# Patient Record
Sex: Male | Born: 1955 | Race: White | Hispanic: No | Marital: Single | Smoking: Never smoker
Health system: Southern US, Community
[De-identification: ages and names within clinical notes are randomized; demographics above are authoritative.]

## PROBLEM LIST (undated history)

## (undated) DIAGNOSIS — I351 Nonrheumatic aortic (valve) insufficiency: Secondary | ICD-10-CM

## (undated) DIAGNOSIS — T8859XA Other complications of anesthesia, initial encounter: Secondary | ICD-10-CM

## (undated) DIAGNOSIS — R0602 Shortness of breath: Secondary | ICD-10-CM

## (undated) DIAGNOSIS — Z954 Presence of other heart-valve replacement: Secondary | ICD-10-CM

## (undated) DIAGNOSIS — Z951 Presence of aortocoronary bypass graft: Secondary | ICD-10-CM

## (undated) DIAGNOSIS — Z953 Presence of xenogenic heart valve: Secondary | ICD-10-CM

## (undated) DIAGNOSIS — T8203XA Leakage of heart valve prosthesis, initial encounter: Secondary | ICD-10-CM

## (undated) DIAGNOSIS — T4145XA Adverse effect of unspecified anesthetic, initial encounter: Secondary | ICD-10-CM

## (undated) DIAGNOSIS — I359 Nonrheumatic aortic valve disorder, unspecified: Secondary | ICD-10-CM

## (undated) DIAGNOSIS — T8209XA Other mechanical complication of heart valve prosthesis, initial encounter: Secondary | ICD-10-CM

## (undated) DIAGNOSIS — R350 Frequency of micturition: Secondary | ICD-10-CM

## (undated) DIAGNOSIS — IMO0001 Reserved for inherently not codable concepts without codable children: Secondary | ICD-10-CM

## (undated) DIAGNOSIS — D696 Thrombocytopenia, unspecified: Secondary | ICD-10-CM

## (undated) DIAGNOSIS — K219 Gastro-esophageal reflux disease without esophagitis: Secondary | ICD-10-CM

## (undated) DIAGNOSIS — L719 Rosacea, unspecified: Secondary | ICD-10-CM

## (undated) DIAGNOSIS — I428 Other cardiomyopathies: Secondary | ICD-10-CM

## (undated) DIAGNOSIS — F329 Major depressive disorder, single episode, unspecified: Secondary | ICD-10-CM

## (undated) DIAGNOSIS — F32A Depression, unspecified: Secondary | ICD-10-CM

## (undated) HISTORY — DX: Reserved for inherently not codable concepts without codable children: IMO0001

## (undated) HISTORY — PX: INGUINAL HERNIA REPAIR: SUR1180

## (undated) HISTORY — DX: Other mechanical complication of heart valve prosthesis, initial encounter: T82.09XA

## (undated) HISTORY — DX: Nonrheumatic aortic (valve) insufficiency: I35.1

## (undated) HISTORY — DX: Leakage of heart valve prosthesis, initial encounter: T82.03XA

## (undated) HISTORY — DX: Gastro-esophageal reflux disease without esophagitis: K21.9

## (undated) HISTORY — DX: Presence of xenogenic heart valve: Z95.3

## (undated) HISTORY — DX: Shortness of breath: R06.02

## (undated) HISTORY — DX: Rosacea, unspecified: L71.9

---
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
## 2003-03-14 ENCOUNTER — Encounter: Payer: Self-pay | Admitting: Cardiovascular Disease

## 2006-05-10 ENCOUNTER — Encounter: Payer: Self-pay | Admitting: Cardiovascular Disease

## 2007-12-13 ENCOUNTER — Encounter: Payer: Self-pay | Admitting: Cardiovascular Disease

## 2008-01-01 ENCOUNTER — Encounter: Payer: Self-pay | Admitting: Cardiovascular Disease

## 2009-07-17 ENCOUNTER — Encounter: Payer: Self-pay | Admitting: Cardiovascular Disease

## 2009-07-24 ENCOUNTER — Telehealth: Payer: Self-pay | Admitting: Cardiovascular Disease

## 2009-08-26 ENCOUNTER — Ambulatory Visit: Payer: Self-pay | Admitting: Cardiovascular Disease

## 2009-08-26 ENCOUNTER — Encounter: Payer: Self-pay | Admitting: Cardiovascular Disease

## 2009-08-26 ENCOUNTER — Ambulatory Visit: Payer: Self-pay

## 2009-08-26 DIAGNOSIS — L719 Rosacea, unspecified: Secondary | ICD-10-CM | POA: Insufficient documentation

## 2009-08-26 DIAGNOSIS — Q231 Congenital insufficiency of aortic valve: Secondary | ICD-10-CM | POA: Insufficient documentation

## 2009-10-21 ENCOUNTER — Ambulatory Visit: Payer: Self-pay | Admitting: Cardiovascular Disease

## 2009-10-30 ENCOUNTER — Encounter: Payer: Self-pay | Admitting: Cardiovascular Disease

## 2009-10-30 ENCOUNTER — Encounter (INDEPENDENT_AMBULATORY_CARE_PROVIDER_SITE_OTHER): Payer: Self-pay | Admitting: *Deleted

## 2009-11-13 ENCOUNTER — Ambulatory Visit: Payer: Self-pay | Admitting: Cardiovascular Disease

## 2009-11-13 DIAGNOSIS — I359 Nonrheumatic aortic valve disorder, unspecified: Secondary | ICD-10-CM | POA: Insufficient documentation

## 2009-11-13 LAB — CONVERTED CEMR LAB
Basophils Absolute: 0.1 10*3/uL (ref 0.0–0.1)
Chloride: 105 meq/L (ref 96–112)
Creatinine, Ser: 0.96 mg/dL (ref 0.40–1.50)
Hemoglobin: 16 g/dL (ref 13.0–17.0)
INR: 1 (ref <1.50)
Lymphocytes Relative: 43 % (ref 12–46)
Lymphs Abs: 3.3 10*3/uL (ref 0.7–4.0)
Monocytes Absolute: 0.4 10*3/uL (ref 0.1–1.0)
Monocytes Relative: 6 % (ref 3–12)
Neutro Abs: 3.4 10*3/uL (ref 1.7–7.7)
Prothrombin Time: 13.1 s (ref 11.6–15.2)
RBC: 5.07 M/uL (ref 4.22–5.81)
RDW: 12.9 % (ref 11.5–15.5)
Sodium: 140 meq/L (ref 135–145)
WBC: 7.6 10*3/uL (ref 4.0–10.5)

## 2009-11-16 ENCOUNTER — Inpatient Hospital Stay (HOSPITAL_BASED_OUTPATIENT_CLINIC_OR_DEPARTMENT_OTHER): Admission: RE | Admit: 2009-11-16 | Discharge: 2009-11-16 | Payer: Self-pay | Admitting: Cardiovascular Disease

## 2009-11-16 ENCOUNTER — Ambulatory Visit: Payer: Self-pay | Admitting: Cardiovascular Disease

## 2009-11-23 ENCOUNTER — Ambulatory Visit: Payer: Self-pay | Admitting: Thoracic Surgery (Cardiothoracic Vascular Surgery)

## 2009-11-23 ENCOUNTER — Encounter: Payer: Self-pay | Admitting: Cardiovascular Disease

## 2009-11-24 ENCOUNTER — Encounter
Admission: RE | Admit: 2009-11-24 | Discharge: 2009-11-24 | Payer: Self-pay | Admitting: Thoracic Surgery (Cardiothoracic Vascular Surgery)

## 2009-12-04 ENCOUNTER — Encounter: Payer: Self-pay | Admitting: Thoracic Surgery (Cardiothoracic Vascular Surgery)

## 2009-12-07 ENCOUNTER — Ambulatory Visit: Payer: Self-pay | Admitting: Thoracic Surgery (Cardiothoracic Vascular Surgery)

## 2009-12-07 ENCOUNTER — Encounter: Payer: Self-pay | Admitting: Cardiovascular Disease

## 2009-12-08 ENCOUNTER — Ambulatory Visit: Payer: Self-pay | Admitting: Thoracic Surgery (Cardiothoracic Vascular Surgery)

## 2009-12-08 ENCOUNTER — Inpatient Hospital Stay (HOSPITAL_COMMUNITY)
Admission: RE | Admit: 2009-12-08 | Discharge: 2009-12-14 | Payer: Self-pay | Admitting: Thoracic Surgery (Cardiothoracic Vascular Surgery)

## 2009-12-08 ENCOUNTER — Encounter: Payer: Self-pay | Admitting: Thoracic Surgery (Cardiothoracic Vascular Surgery)

## 2009-12-08 DIAGNOSIS — Z953 Presence of xenogenic heart valve: Secondary | ICD-10-CM

## 2009-12-08 HISTORY — PX: AORTIC VALVE REPLACEMENT: SHX41

## 2009-12-08 HISTORY — DX: Presence of xenogenic heart valve: Z95.3

## 2009-12-21 ENCOUNTER — Encounter
Admission: RE | Admit: 2009-12-21 | Discharge: 2009-12-21 | Payer: Self-pay | Admitting: Thoracic Surgery (Cardiothoracic Vascular Surgery)

## 2009-12-21 ENCOUNTER — Ambulatory Visit: Payer: Self-pay | Admitting: Thoracic Surgery (Cardiothoracic Vascular Surgery)

## 2009-12-21 ENCOUNTER — Encounter: Payer: Self-pay | Admitting: Cardiovascular Disease

## 2009-12-31 ENCOUNTER — Ambulatory Visit: Payer: Self-pay | Admitting: Cardiovascular Disease

## 2009-12-31 DIAGNOSIS — R002 Palpitations: Secondary | ICD-10-CM | POA: Insufficient documentation

## 2009-12-31 DIAGNOSIS — R490 Dysphonia: Secondary | ICD-10-CM | POA: Insufficient documentation

## 2010-01-04 ENCOUNTER — Encounter: Payer: Self-pay | Admitting: Cardiovascular Disease

## 2010-01-04 ENCOUNTER — Ambulatory Visit: Payer: Self-pay | Admitting: Thoracic Surgery (Cardiothoracic Vascular Surgery)

## 2010-01-04 ENCOUNTER — Ambulatory Visit (HOSPITAL_COMMUNITY)
Admission: RE | Admit: 2010-01-04 | Discharge: 2010-01-04 | Payer: Self-pay | Source: Home / Self Care | Admitting: Cardiovascular Disease

## 2010-01-06 ENCOUNTER — Encounter: Payer: Self-pay | Admitting: Cardiovascular Disease

## 2010-01-06 ENCOUNTER — Ambulatory Visit (HOSPITAL_COMMUNITY): Admission: RE | Admit: 2010-01-06 | Discharge: 2010-01-06 | Payer: Self-pay | Admitting: Cardiovascular Disease

## 2010-01-06 ENCOUNTER — Ambulatory Visit: Payer: Self-pay

## 2010-01-06 ENCOUNTER — Ambulatory Visit: Payer: Self-pay | Admitting: Cardiovascular Disease

## 2010-01-07 ENCOUNTER — Encounter: Admission: RE | Admit: 2010-01-07 | Discharge: 2010-02-12 | Payer: Self-pay | Admitting: Cardiovascular Disease

## 2010-01-08 ENCOUNTER — Encounter: Payer: Self-pay | Admitting: Cardiovascular Disease

## 2010-01-12 ENCOUNTER — Encounter: Payer: Self-pay | Admitting: Cardiovascular Disease

## 2010-01-14 ENCOUNTER — Encounter: Payer: Self-pay | Admitting: Cardiovascular Disease

## 2010-01-15 ENCOUNTER — Encounter: Payer: Self-pay | Admitting: Cardiovascular Disease

## 2010-01-21 ENCOUNTER — Telehealth: Payer: Self-pay | Admitting: Cardiovascular Disease

## 2010-01-22 ENCOUNTER — Encounter (INDEPENDENT_AMBULATORY_CARE_PROVIDER_SITE_OTHER): Payer: Self-pay | Admitting: *Deleted

## 2010-02-08 ENCOUNTER — Encounter: Payer: Self-pay | Admitting: Cardiovascular Disease

## 2010-02-15 ENCOUNTER — Ambulatory Visit: Payer: Self-pay | Admitting: Cardiovascular Disease

## 2010-02-22 ENCOUNTER — Ambulatory Visit: Payer: Self-pay | Admitting: Thoracic Surgery (Cardiothoracic Vascular Surgery)

## 2010-02-22 ENCOUNTER — Encounter: Payer: Self-pay | Admitting: Cardiovascular Disease

## 2010-02-22 ENCOUNTER — Encounter
Admission: RE | Admit: 2010-02-22 | Discharge: 2010-02-22 | Payer: Self-pay | Admitting: Thoracic Surgery (Cardiothoracic Vascular Surgery)

## 2010-07-14 ENCOUNTER — Ambulatory Visit: Payer: Self-pay | Admitting: Cardiovascular Disease

## 2010-07-14 ENCOUNTER — Ambulatory Visit: Payer: Self-pay

## 2010-07-14 ENCOUNTER — Ambulatory Visit: Payer: Self-pay | Admitting: Cardiology

## 2010-07-14 ENCOUNTER — Ambulatory Visit (HOSPITAL_COMMUNITY): Admission: RE | Admit: 2010-07-14 | Discharge: 2010-07-14 | Payer: Self-pay | Admitting: Cardiovascular Disease

## 2010-09-06 ENCOUNTER — Ambulatory Visit: Payer: Self-pay | Admitting: Thoracic Surgery (Cardiothoracic Vascular Surgery)

## 2010-09-06 ENCOUNTER — Encounter: Payer: Self-pay | Admitting: Cardiovascular Disease

## 2010-10-15 ENCOUNTER — Telehealth: Payer: Self-pay | Admitting: Cardiovascular Disease

## 2010-11-08 ENCOUNTER — Encounter (INDEPENDENT_AMBULATORY_CARE_PROVIDER_SITE_OTHER): Payer: Self-pay | Admitting: *Deleted

## 2010-12-23 ENCOUNTER — Ambulatory Visit (HOSPITAL_COMMUNITY)
Admission: RE | Admit: 2010-12-23 | Discharge: 2010-12-23 | Payer: Self-pay | Source: Home / Self Care | Attending: Cardiovascular Disease | Admitting: Cardiovascular Disease

## 2011-01-04 ENCOUNTER — Ambulatory Visit
Admission: RE | Admit: 2011-01-04 | Discharge: 2011-01-04 | Payer: Self-pay | Source: Home / Self Care | Attending: Cardiovascular Disease | Admitting: Cardiovascular Disease

## 2011-01-04 ENCOUNTER — Ambulatory Visit: Admission: RE | Admit: 2011-01-04 | Discharge: 2011-01-04 | Payer: Self-pay | Source: Home / Self Care

## 2011-01-04 ENCOUNTER — Encounter: Payer: Self-pay | Admitting: Cardiovascular Disease

## 2011-01-04 DIAGNOSIS — R0602 Shortness of breath: Secondary | ICD-10-CM | POA: Insufficient documentation

## 2011-01-07 ENCOUNTER — Ambulatory Visit
Admission: RE | Admit: 2011-01-07 | Discharge: 2011-01-07 | Payer: Self-pay | Source: Home / Self Care | Attending: Cardiovascular Disease | Admitting: Cardiovascular Disease

## 2011-01-07 ENCOUNTER — Other Ambulatory Visit: Payer: Self-pay | Admitting: Cardiovascular Disease

## 2011-01-07 LAB — TSH: TSH: 0.72 u[IU]/mL (ref 0.35–5.50)

## 2011-01-07 LAB — T3, FREE: T3, Free: 2.7 pg/mL (ref 2.3–4.2)

## 2011-01-07 LAB — T4, FREE: Free T4: 0.86 ng/dL (ref 0.60–1.60)

## 2011-01-24 ENCOUNTER — Encounter: Payer: Self-pay | Admitting: Cardiovascular Disease

## 2011-01-24 ENCOUNTER — Ambulatory Visit (HOSPITAL_COMMUNITY)
Admission: RE | Admit: 2011-01-24 | Discharge: 2011-01-24 | Payer: Self-pay | Source: Home / Self Care | Attending: Cardiovascular Disease | Admitting: Cardiovascular Disease

## 2011-01-27 NOTE — Letter (Signed)
Summary: Appointment - Cardiac MRI  [facility], Main Office  [address]   [place], [place] [address]   Phone: [phone]  Fax: [phone]      November 08, 2010 MRN: [id number]   [name] [address] [place], [place]  [address]   Dear [name],   We have scheduled the above patient for an appointment for a Cardiac MRI on 12-23-2010 at  9:00 a.m.  Please refer to the below information for the location and instructions for this test:  Location:     [facility]       [address]       [place], [place]  [address] Instructions:    Wilmon Arms at [facility] Outpatient Registration 45 minutes prior to your appointment time.  This will ensure you are in the Radiology Department 30 minutes prior to your appointment.    There are no restrictions for this test you may eat and take medications as usual.  If you need to reschedule this appointment please call at the number listed above.  Sincerely,     Lorne Skeens  [facility] Scheduling Team

## 2011-01-27 NOTE — Consult Note (Signed)
Summary: [facility]  [facility]   Imported By: Marylou Mccoy 02/12/2010 15:47:58  _____________________________________________________________________  External Attachment:    Type:   Image     Comment:   External Document

## 2011-01-27 NOTE — Progress Notes (Signed)
Summary: dose pt need testing prior to appt  Phone Note Call from Patient Call back at Work Phone [phone]   Caller: Patient Reason for Call: Talk to Nurse, Talk to Doctor Summary of Call: pt has scheduled his yearly appt in Jan and thought he was to MRI and echo prior to the appt Initial call taken by: Omer Jack,  October 15, 2010 3:22 PM  Follow-up for Phone Call        Mr. Sallade calls today to schedule cardiac mri for late December due to insurance.  I have placed the order and he will receive a call when appt is scheduled. Mylo Red RN     Appended Document: dose pt need testing prior to appt yes should have echo and mri before appt  Appended Document: dose pt need testing prior to appt Left message to call back    Appended Document: dose pt need testing prior to appt spoke with pt, echo scheduled

## 2011-01-27 NOTE — Assessment & Plan Note (Signed)
Summary: eph/per pt call/had cabg/lg   History of Present Illness: [name] is seen today post minimally invasive AVR for severe AS.  He and his wife had a myriad of Q's.  Overall he did well.  There was a lot of annular calcification and the surgery was long.  There was also a small perivalvular leak by TEE at the end of the case.  He also has had some vocal cord trauma with hoarseness He would like to see and ENT doctor and when I mentioned Suzanna Obey he indicated he was a friend.  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  He indicates some mild drainage from his right thoracotomy incision but no fever or erythmema.  He asked about weight and exercise limitations and we went over these at length.  Since he did not have a sternotmy I think we can be a little more libral.  He will go to cardiac rehab starting next week.  It would be reasonable to stop his BB at this point.  He will take it 1/day for 3 weeks and then stop it if his HR is less than 85.  I told him he had no travel restrictions.  He will need a baseline TTE to assess the valve, gradients and peri-valvular leak  Current Problems (verified): 1)  Pre-operative Cardiovascular Examination  (ICD-V72.81) 2)  Aortic Disorder  (ICD-424.1) 3)  Bicuspid Aortic Valve  (ICD-746.4) 4)  Rosacea  (ICD-695.3)  Current Medications (verified): 1)  Fish Oil  Oil (Fish Oil) .Marland Kitchen.. 1 Tablespoon Daily 2)  Metoprolol Tartrate 25 Mg Tabs (Metoprolol Tartrate) .Marland Kitchen.. 1 Tab By Mouth Two Times A Day 3)  Aspirin 81 Mg Tbec (Aspirin) .... Take One Tablet By Mouth Daily 4)  Tramadol Hcl 50 Mg Tabs (Tramadol Hcl) .... 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 All other systems reviewed and negative  Vital Signs:  Patient profile:   55 year old male Height:      72 inches Weight:      186 pounds BMI:     25.32 Pulse rate:   89 / minute Resp:     12 per minute BP sitting:   115 / 69  (left arm)  Vitals Entered By: [name] (December 31, 2009 4:11 PM)  Physical Exam  General:  Affect appropriate Healthy:  appears stated age HEENT: normal Neck supple with no adenopathy JVP normal no bruits no thyromegaly Lungs clear with no wheezing and good diaphragmatic motion Heart:  S1/S2 SEM no AR  murmur,rub, gallop or click PMI normal Abdomen: benighn, BS positve, no tenderness, no AAA no bruit.  No HSM or HJR Distal pulses intact with no bruits No edema Neuro non-focal Skin warm and dry     Impression & Recommendations:  Problem # 1:  BICUSPID AORTIC VALVE (ICD-746.4) S/P tissue AVR with ? small periprosthetic leak  by TEE intraop.  TTE  Went over SBE prophyllaxis with him His updated medication list for this problem includes:    Metoprolol Tartrate 25 Mg Tabs (Metoprolol tartrate) .Marland Kitchen... 1 tab by mouth two times a day    Aspirin 81 Mg Tbec (Aspirin) .Marland Kitchen... Take one tablet by mouth daily  Problem # 2:  HOARSENESS (ZOX-096.04) Refer to Suzanna Obey ENT.  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  At this point reasonable to D/C BB and see how he does His updated medication list for this problem includes:    Metoprolol Tartrate 25 Mg  Tabs (Metoprolol tartrate) .Marland Kitchen... 1 tab by mouth two times a day    Aspirin 81 Mg Tbec (Aspirin) .Marland Kitchen... Take one tablet by mouth daily  Other Orders: Echocardiogram (Echo)  Patient Instructions: 1)  Your physician recommends that you schedule a follow-up appointment in: 8 WEEKS 2)  You have been referred to DR Jonny Ruiz [facility]-ENT THROAT ISSUES POST INTUBATION 3)  Your physician has requested that you have an echocardiogram.  Echocardiography is a painless test that uses sound waves to create images of your heart. It provides your doctor with information about the size and shape of your heart and how well your heart's chambers and valves are working.  This procedure takes approximately one hour. There are no restrictions for this procedure.  Appended Document: eph/per pt call/had cabg/lg Spent 35 minutes counseling and answering questions for patient and wife.  This does not include time to review surgical records and exam.  Reviewe of his echo does show somewhat high tranvalvular gradients and mild periprosthetic AR.  Interestingly he has low normal EF with septal hypokinesis.  Overall this is not an ideal post op echo for such a young person with an AVR who chose to have a tissue valve to avoid coumadin.  However reading the operative note there was clearly an issue with the level of calcification of the aortic annulus requiring over 30 minutes to decalcify and I suspect this is the issue resulting in the peri-prosthetic leak

## 2011-01-27 NOTE — Progress Notes (Signed)
Summary: questions regarding meds and cardiac rehab  Phone Note Call from Patient Call back at [phone]   Caller: Patient Reason for Call: Talk to Nurse, Talk to Doctor Summary of Call: pt has some questions about his medication and cardiac rehab that wont wait til his ppt  Initial call taken by: Omer Jack,  January 21, 2010 11:26 AM  Follow-up for Phone Call        SPOKE WITH PT RE MESSAGE PT   IS CONCERNED THAT MONITORING AT REHAB SHOWED SOME PVC'S ON 1 OCCASION  INSTRUCTED THAT WAS OKAY  NOT LIFE THREATENING IS CONCERNED MAY HE STILL DISCONTINUE METOPROLOL ALSO WOULD LIKE NOTE TO RETURN BACK TO WORK FULL TIME NEXT FRI HAS DESK JOB. Follow-up by: Scherrie Bateman, LPN,  January 21, 2010 11:59 AM  Additional Follow-up for Phone Call Additional follow up Details #1::        Ok to return to work and stop BB Additional Follow-up by: Colon Branch, MD, [facility],  January 21, 2010 12:47 PM    New/Updated Medications: AMOXICILLIN 500 MG TABS (AMOXICILLIN) 4 TABS 1 HOUR BEFORE DENTAL PROCEDURE Prescriptions: AMOXICILLIN 500 MG TABS (AMOXICILLIN) 4 TABS 1 HOUR BEFORE DENTAL PROCEDURE  #4 x 1   Entered by:   Scherrie Bateman, LPN   Authorized by:   Colon Branch, MD, [facility]   Signed by:   Scherrie Bateman, LPN on 82/95/6213   Method used:   Electronically to        [facility] [address] [phone]* (retail)       [address]       [place], [place]  [address]       Ph: [phone] or [phone]       Fax: [phone]   RxID:   [id number]   Appended Document: questions regarding meds and cardiac rehab spoke with pt, note for work placed at front desk for pick up

## 2011-01-27 NOTE — Assessment & Plan Note (Signed)
Summary: per check out/sf   Visit Type:  Follow-up  CC:  No cardiac complains- Pt. quit taking Metoprolol one week agp.  History of Present Illness: [name] is seen today for F/U of his AVR and murmur.  His echo on 01/06/10 did show a mild perivalvular leak. He had a lot of annular calcification and I suspect this was the reason.  He had a right lateral thoracotomy approach and I told him his lfiting restrictions could be less stringent.  He wants to fly to [place] for an [employer] trade show and I told him this wiold be fine.  He will have a F/U echo in July.  He is off his BB and BP is fine.  He had some PVC's during cardiac rehab that were asymptomatic and they seem to be subsiding   Current Problems (verified): 1)  Palpitations, Occasional  (ICD-785.1) 2)  Hoarseness  (ICD-784.42) 3)  Pre-operative Cardiovascular Examination  (ICD-V72.81) 4)  Aortic Disorder  (ICD-424.1) 5)  Bicuspid Aortic Valve  (ICD-746.4) 6)  Rosacea  (ICD-695.3)  Current Medications (verified): 1)  Fish Oil  Oil (Fish Oil) .Marland Kitchen.. 1 Tablespoon Daily 2)  Aspirin 81 Mg Tbec (Aspirin) .... Take One Tablet By Mouth Daily 3)  Amoxicillin 500 Mg Tabs (Amoxicillin) .... 4 Tabs 1 Hour Before Dental Procedure  Allergies (verified): No Known Drug Allergies  Past History:  Past Medical History: Last updated: 08/26/2009 Murmur Rosacea Hernia Reflux  Past Surgical History: Last updated: 12/31/2009 Left inguinal hernia repair  PROCEDURE:  Right miniature anterior thoracotomy for aortic valve replacement (21-mm [facility] Ease pericardial tissue valve).  SURGEON:  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 All other systems reviewed and negative  Vital Signs:  Patient profile:   55 year old male Height:      72 inches Weight:      185.50 pounds BMI:     25.25 Pulse rate:   78 / minute Pulse rhythm:   regular Resp:     18 per minute BP sitting:   120 / 69  (left arm) Cuff size:   large  Vitals Entered By: Vikki Ports (February 15, 2010 8:53 AM)  Physical Exam  General:  Affect appropriate Healthy:  appears stated age HEENT: normal Neck supple with no adenopathy JVP normal no bruits no thyromegaly Lungs clear with no wheezing and good diaphragmatic motion Heart:  S1/S2 fiarly loud systolic murmur through AVR.  AR hard to hear no ,rub, gallop or click PMI normal Abdomen: benighn, BS positve, no tenderness, no AAA no bruit.  No HSM or HJR Distal pulses intact with no bruits No edema Neuro non-focal Skin warm and dry    Impression & Recommendations:  Problem # 1:  AORTIC DISORDER (ICD-424.1) Tissue AVR with mild peri-prosthetic leak.  F/U echo in July.  SBE prophylaxis The following medications were removed from the medication list:    Metoprolol Tartrate 25 Mg Tabs (Metoprolol tartrate) .Marland Kitchen... 1 tab by mouth two times a day  Problem # 2:  PALPITATIONS, OCCASIONAL (ICD-785.1) Resolving BB  stopped The following medications were removed from the medication list:    Metoprolol Tartrate 25 Mg Tabs (Metoprolol tartrate) .Marland Kitchen... 1 tab by mouth two times a day His updated medication list for this problem includes:    Aspirin 81 Mg Tbec (Aspirin) .Marland Kitchen... Take one tablet by mouth daily  Problem # 3:  HOARSENESS (ICD-784.42) Improved.  Seen by Annalee Genta and vocal chord specialist at [facility].  Likely trauma from intubaiton  Patient Instructions: 1)  Your physician recommends that you schedule a  follow-up appointment in: JULY WITH DR Eden Emms  AND ECHO SAME DAY 2)  Your physician recommends that you continue on your current medications as directed. Please refer to the Current Medication list given to you today. 3)  Your physician has requested that you have an echocardiogram.  Echocardiography is a painless test that uses sound waves to create images of your heart. It provides your doctor with information about the size and shape of your heart and how well your heart's chambers and valves are working.  This procedure takes approximately one hour. There are no restrictions for this procedure.SEE DR Eden Emms SAME DAY

## 2011-01-27 NOTE — Assessment & Plan Note (Signed)
Summary: rov/ need echo sameday/sl   Visit Type:  Follow-up Primary Provider:  mitchell  CC:  echo today - no chest pain but pt does have muscle tightness.  History of Present Illness: [name] is about 7 months post minimally invasive tissue AVR with Dr Cornelius Moras.  It was done via right thoracotomy.  His F/U echo in January showed EF 55% with mild to moderate AR.  His aortic annulus was very calcified and I am sure this is the reason for the periprosthetic leak.  Echo today shows a similar degree of AR and a stable mean gradient of compared to in January.  There has been no SSCP, palpitatoins, edema or syncope.  He has been seeing the dentist.  He is hesitant to take ASA but  I told him he should.  His EF today was down a little compared to previous. EF 45% with some focaliity to the septum.  He had no CAD on cath prior to surgery.  Given the AR and decreased EF we will start him on low dose ACE.  He will have a F/U cardiac MRI in 6 months to further assess LV size and EF  Reviewed Echo 1/11 and today  Current Problems (verified): 1)  Palpitations, Occasional  (ICD-785.1) 2)  Hoarseness  (ICD-784.42) 3)  Pre-operative Cardiovascular Examination  (ICD-V72.81) 4)  Aortic Disorder  (ICD-424.1) 5)  Bicuspid Aortic Valve  (ICD-746.4) 6)  Rosacea  (ICD-695.3)  Current Medications (verified): 1)  Fish Oil  Oil (Fish Oil) .Marland Kitchen.. 1 Tablespoon Daily 2)  Aspirin 81 Mg Tbec (Aspirin) .... Pt Takes Occ 3)  Amoxicillin 500 Mg Tabs (Amoxicillin) .... 4 Tabs 1 Hour Before Dental Procedure  Allergies (verified): No Known Drug Allergies  Past History:  Past Medical History: Last updated: 08/26/2009 Murmur Rosacea Hernia Reflux  Past Surgical History: Last updated: 12/31/2009 Left inguinal hernia repair  PROCEDURE:  Right miniature anterior thoracotomy for aortic valve replacement (21-mm [facility] Ease pericardial tissue valve).  SURGEON:  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   Vital Signs:  Patient profile:   55 year old male Height:      72 inches Weight:      190 pounds Pulse rate:   67 / minute BP sitting:   120 / 75  (left arm) Cuff size:   large  Vitals Entered By: Burnett Kanaris, CNA (July 14, 2010 2:07 PM)  Physical Exam  General:  Affect appropriate Healthy:  appears stated age HEENT: normal Neck supple with no adenopathy JVP normal no bruits no thyromegaly Lungs clear with no wheezing and good diaphragmatic motion Heart:  S1/S2  AS/AR murmur,rub, gallop or click PMI normal Abdomen: benighn, BS positve, no tenderness, no AAA no bruit.  No HSM or HJR Distal pulses intact with no bruits No edema Neuro non-focal Skin warm and dry    Impression & Recommendations:  Problem # 1:  BICUSPID AORTIC VALVE (ICD-746.4) Long discussion with patient and wife regarding periprothetic leak and decreased LV function.  Start ACE.  Surgery quite  difficult due to annular calcification.  Will likely check EF with cardiac MRI in 6 months.  No CAD prior to surgery.   His updated medication list for this problem includes:    Aspirin 81 Mg Tbec (Aspirin) .Marland Kitchen... Pt takes occ    Ramipril 2.5 Mg Caps (Ramipril) .Marland Kitchen... 1 once daily  His updated medication list for this problem includes:    Aspirin 81 Mg Tbec (Aspirin) .Marland Kitchen... Pt takes occ  Other Orders: Cardiac MRI (Cardiac MRI)  Patient Instructions: 1)  Your physician recommends that you schedule a follow-up  appointment in: 6 MONTHS WITH DR Eden Emms 2)  Your physician recommends that you continue on your current medications as directed. Please refer to the Current Medication list given to you today. 3)  Your physician recommends that you schedule a follow-up appointment in:  4)  Your physician has requested that you have a cardiac MRI.  Cardiac MRI uses a computer to create images of your heart as it's beating, producing both still and moving pictures of your heart and major blood vessels. For further information please visit  [email or web address]  Please follow the instruction sheet given to you today for more information.6 MONTHS Prescriptions: RAMIPRIL 2.5 MG CAPS (RAMIPRIL) 1 once daily  #30 x 11   Entered by:   Scherrie Bateman, LPN   Authorized by:   Colon Branch, MD, [facility]   Signed by:   Scherrie Bateman, LPN on 04/54/0981   Method used:   Electronically to        [facility]. [address] [phone]* (retail)       [address]       [place], [place]  [address]       Ph: [phone] or [phone]       Fax: [phone]   RxID:   [id number]

## 2011-01-27 NOTE — Assessment & Plan Note (Signed)
Summary: 6 mo f/u ./cy   Primary Provider:  mitchell  CC:  pt complains of being unbalanced pt states its due to  Ramipril .  History of Present Illness: [name] is seen today to review his echo and MRI.  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  Post op echo 1/11 showed normal EF.  He had mild AR likely related to the annular calcification.  F/U echo 7/11 showed EF 40-45%   MRI done last month EF 34% with diffuse hypokinesis.  Had no CAD at cath.  Echo todahy continues to show moderate LVE with global hypokinesis worse in septum and apex.  Mild prosthetic AR.  Discussed increasing ramapril to 5mg  daily He has some postural symptoms now and up titration will be limited.  Prefer highest dose of ACE rather than polypharmacy.    Answered multiple quesitons regarding diagnosis of cardiomyopathy including prognosis, etiology and F/U Will order Vo2 max to establish exercise capacity since he complains of subjective dyspnea and fatigue but seems to have good exercise tolerance by description of ho much he does.    Told him that ASA and CoEnzyme Q ok to use.  No other special diet needed  Will check TSH/.T4 and T3 at his request for symptoms of dyspnea and fatigue  Current Problems (verified): 1)  Shortness of Breath  (ICD-786.05) 2)  Palpitations, Occasional  (ICD-785.1) 3)  Hoarseness  (ICD-784.42) 4)  Pre-operative Cardiovascular Examination  (ICD-V72.81) 5)  Aortic Disorder  (ICD-424.1) 6)  Bicuspid Aortic Valve  (ICD-746.4) 7)  Rosacea  (ICD-695.3)  Current Medications (verified): 1)  Fish Oil  Oil (Fish Oil) .Marland Kitchen.. 1 Tablespoon Daily 2)  Aspirin 81 Mg Tbec (Aspirin) .... Pt Takes Occ 3)  Amoxicillin 500 Mg Tabs (Amoxicillin) .... 4 Tabs 1 Hour Before Dental Procedure As Needed 4)  Ramipril 2.5 Mg Caps (Ramipril) .... Take One Capsule By Mouth Two Times A Day  Allergies (verified): No Known Drug Allergies  Past History:  Past Medical History: Last updated:  08/26/2009 Murmur Rosacea Hernia Reflux  Past Surgical History: Last updated: 12/31/2009 Left inguinal hernia repair  PROCEDURE:  Right miniature anterior thoracotomy for aortic valve replacement (21-mm [facility] Ease pericardial tissue valve).  SURGEON:  [name], MD ASSISTANT:  Kerin Perna, MD   Family History: Last updated: 08/26/2009 Mother and father still both alive  Social History: Last updated: 08/26/2009 Married with 2 children Business Midwife processing Non-drinker Non smoker Exercises 4 days per week  Review of Systems       Denies fever, malais, weight loss, blurry vision, decreased visual acuity, cough, sputum, hemoptysis, pleuritic pain, palpitaitons, heartburn, abdominal pain, melena, lower extremity edema, claudication, or rash.   Vital Signs:  Patient profile:   55 year old male Height:      72 inches Weight:      187 pounds BMI:     25.45 Pulse rate:   68 / minute Resp:     14 per minute BP sitting:   120 / 80  (left arm)  Vitals Entered By: [name] (January 04, 2011 4:13 PM)  Physical Exam  General:  Affect appropriate Healthy:  appears stated age HEENT: normal Neck supple with no adenopathy JVP normal no bruits no thyromegaly Lungs clear with no wheezing and good diaphragmatic motion Heart:  S1/S2 systolic murmur through valve and mild AR murmur,rub, gallop or click PMI normal Abdomen: benighn, BS positve, no tenderness, no AAA no bruit.  No HSM or HJR Distal pulses intact with no bruits No edema Neuro non-focal Skin warm and dry    Impression & Recommendations:  Problem # 1:  BICUSPID AORTIC VALVE (ICD-746.4) S/P AVR with perporsthetic leak echo in 6 months  SBE prophylaxis His updated medication list for this problem includes:    Aspirin 81 Mg Tbec (Aspirin) .Marland Kitchen... Pt takes occ    Ramipril 2.5 Mg Caps (Ramipril) .Marland Kitchen... Take one capsule by mouth two times a day  Problem # 2:   SHORTNESS OF BREATH (ICD-786.05) Vo2 max to objectify.  DCM confirmed by MRI and F/U echo Increase ACE.  Follow for postural symptoms.   His updated medication list for this problem includes:    Aspirin 81 Mg Tbec (Aspirin) .Marland Kitchen... Pt takes occ    Ramipril 2.5 Mg Caps (Ramipril) .Marland Kitchen... Take one capsule by mouth two times a day  Orders: CPX Test at [facility] (CPX Test)  Patient Instructions: 1)  Your physician has recommended you make the following change in your medication: Increasing ramapril 2.5mg  two times a day 2)  Your physician wants you to follow-up in: 6 months.  You will receive a reminder letter in the mail two months in advance. If you don't receive a letter, please call our office to schedule the follow-up appointment. 3)  Your physician has recommended that you have a cardiopulmonary stress test (CPX).  CPX testing is a non-invasive measurement of heart and lung function. It replaces a traditional treadmill stress test. This type of test provides a tremendous amount of information that relates not only to your present condition but also for future outcomes.  This test combines measurements of your ventilation, respiratory gas exchange in the lungs, electrocardiogram (EKG), blood pressure and physical response before, during, and following an exercise protocol. 4)  Your physician recommends that you return for lab work in: Go to [facility] across from [facility] for Labs-located in the basement. Prescriptions: RAMIPRIL 2.5 MG CAPS (RAMIPRIL) Take one capsule by mouth two times a day  #180 x 3   Entered by:   Caralee Ates CMA   Authorized by:   Colon Branch, MD, [facility]   Signed by:   Caralee Ates CMA on 01/04/2011   Method used:   Faxed to ...       [facility] Environmental education officer)       [address] [address]       [place], [place]  [address]       Ph: [phone]       Fax: [phone]   RxID:   [id number] AMLODIPINE BESYLATE 2.5 MG TABS (AMLODIPINE BESYLATE) Take one  tablet by mouth two times a day  #180 x 4   Entered by:   Caralee Ates CMA   Authorized by:   Colon Branch, MD, [facility]   Signed by:   Caralee Ates CMA on 01/04/2011   Method used:   Faxed to ...       [facility] Environmental education officer)       [address] [address]       [place], [place]  [address]       Ph: [phone]       Fax: [phone]   RxID:   [id number]

## 2011-01-27 NOTE — Letter (Signed)
Summary: Return To Work  [employer], Main Office  [address]   [place], [place] [address]   Phone: [phone]  Fax: [phone]    01/22/2010  TO: WHOM IT MAY CONCERN   RE: [name] [address] [place]   The above named individual is under my medical care and may return to work [id number] 01-29-10 WITH NO RESTRICTIONS  If you have any further questions or need additional information, please call.     Sincerely, Deliah Goody, RN/ Dr [name]

## 2011-01-27 NOTE — Miscellaneous (Signed)
Summary: [facility]   [facility]   Imported By: Roderic Ovens 02/18/2010 16:35:37  _____________________________________________________________________  External Attachment:    Type:   Image     Comment:   External Document

## 2011-01-27 NOTE — Letter (Signed)
Summary: [facility] Medical Office Note  [facility] Medical Office Note   Imported By: Roderic Ovens 02/18/2010 13:01:07  _____________________________________________________________________  External Attachment:    Type:   Image     Comment:   External Document

## 2011-01-27 NOTE — Miscellaneous (Signed)
Summary: [facility]  [facility]   Imported By: Roderic Ovens 01/27/2010 14:54:33  _____________________________________________________________________  External Attachment:    Type:   Image     Comment:   External Document

## 2011-01-27 NOTE — Letter (Signed)
Summary: [facility] Office Visit  [facility] Office Visit   Imported By: Kassie Mends 01/13/2010 11:06:58  _____________________________________________________________________  External Attachment:    Type:   Image     Comment:   External Document

## 2011-01-27 NOTE — Letter (Signed)
Summary: [facility]   [facility]   Imported By: Roderic Ovens 03/04/2010 15:13:02  _____________________________________________________________________  External Attachment:    Type:   Image     Comment:   External Document

## 2011-01-27 NOTE — Miscellaneous (Signed)
  Clinical Lists Changes  Observations: Added new observation of PAST SURG HX: Left inguinal hernia repair  PROCEDURE:  Right miniature anterior thoracotomy for aortic valve replacement (21-mm [employer] [facility] Ease pericardial tissue valve).  SURGEON:  [name], MD ASSISTANT:  Kerin Perna, MD  (12/31/2009 16:14)       Past History:  Past Surgical History: Left inguinal hernia repair  PROCEDURE:  Right miniature anterior thoracotomy for aortic valve replacement (21-mm [facility] Ease pericardial tissue valve).  SURGEON:  [name], MD ASSISTANT:  Kerin Perna, MD

## 2011-01-27 NOTE — Letter (Signed)
Summary: Return To Work  [employer], Main Office  [address]   [place], [place] [address]   Phone: [phone]  Fax: [phone]    01/22/2010  TO: Leodis Sias IT MAY CONCERN   RE: [name] [address] [place]   The above named individual is under my medical care and may return to work on:  If you have any further questions or need additional information, please call.     Sincerely,    Deliah Goody, RN

## 2011-01-27 NOTE — Letter (Signed)
Summary: No Labs Notification  No Labs Notification   Imported By: Roderic Ovens 01/21/2010 11:41:08  _____________________________________________________________________  External Attachment:    Type:   Image     Comment:   External Document

## 2011-01-27 NOTE — Letter (Signed)
Summary: [facility] Office Visit   [facility] Office Visit   Imported By: Roderic Ovens 10/19/2010 14:13:23  _____________________________________________________________________  External Attachment:    Type:   Image     Comment:   External Document

## 2011-02-22 NOTE — Letter (Signed)
Summary: [facility]  [facility]   Imported By: Marylou Mccoy 02/16/2011 13:42:06  _____________________________________________________________________  External Attachment:    Type:   Image     Comment:   External Document

## 2011-03-07 ENCOUNTER — Encounter: Payer: Self-pay | Admitting: Thoracic Surgery (Cardiothoracic Vascular Surgery)

## 2011-03-28 LAB — CBC
HCT: 35.5 % — ABNORMAL LOW (ref 39.0–52.0)
HCT: 37.8 % — ABNORMAL LOW (ref 39.0–52.0)
Hemoglobin: 12.3 g/dL — ABNORMAL LOW (ref 13.0–17.0)
MCV: 91.2 fL (ref 78.0–100.0)
MCV: 91.3 fL (ref 78.0–100.0)
Platelets: 127 10*3/uL — ABNORMAL LOW (ref 150–400)
Platelets: 180 10*3/uL (ref 150–400)
RDW: 13.1 % (ref 11.5–15.5)
RDW: 13.5 % (ref 11.5–15.5)
WBC: 10.2 10*3/uL (ref 4.0–10.5)
WBC: 13.5 10*3/uL — ABNORMAL HIGH (ref 4.0–10.5)

## 2011-03-28 LAB — BASIC METABOLIC PANEL
BUN: 12 mg/dL (ref 6–23)
BUN: 16 mg/dL (ref 6–23)
Chloride: 102 mEq/L (ref 96–112)
Creatinine, Ser: 1.01 mg/dL (ref 0.4–1.5)
GFR calc non Af Amer: >60 mL/min (ref >60)
GFR calc non Af Amer: >60 mL/min (ref >60)
Glucose, Bld: 118 mg/dL — ABNORMAL HIGH (ref 70–99)
Glucose, Bld: 125 mg/dL — ABNORMAL HIGH (ref 70–99)
Potassium: 3.7 mEq/L (ref 3.5–5.1)
Potassium: 4 mEq/L (ref 3.5–5.1)
Sodium: 140 mEq/L (ref 135–145)

## 2011-03-29 LAB — APTT: aPTT: 35 seconds (ref 24–37)

## 2011-03-29 LAB — BASIC METABOLIC PANEL
BUN: 11 mg/dL (ref 6–23)
CO2: 24 mEq/L (ref 19–32)
Calcium: 8.8 mg/dL (ref 8.4–10.5)
Chloride: 110 mEq/L (ref 96–112)
Creatinine, Ser: 0.89 mg/dL (ref 0.4–1.5)
GFR calc Af Amer: >60 mL/min (ref >60)
Glucose, Bld: 150 mg/dL — ABNORMAL HIGH (ref 70–99)

## 2011-03-29 LAB — BLOOD GAS, ARTERIAL
Drawn by: 313941
FIO2: 0.21 %
O2 Saturation: 97.5 %
pCO2 arterial: 40.7 mmHg (ref 35.0–45.0)
pO2, Arterial: 84.4 mmHg (ref 80.0–100.0)

## 2011-03-29 LAB — POCT I-STAT 4, (NA,K, GLUC, HGB,HCT)
Glucose, Bld: 104 mg/dL — ABNORMAL HIGH (ref 70–99)
Glucose, Bld: 110 mg/dL — ABNORMAL HIGH (ref 70–99)
Glucose, Bld: 122 mg/dL — ABNORMAL HIGH (ref 70–99)
Glucose, Bld: 143 mg/dL — ABNORMAL HIGH (ref 70–99)
Glucose, Bld: 85 mg/dL (ref 70–99)
HCT: 28 % — ABNORMAL LOW (ref 39.0–52.0)
HCT: 29 % — ABNORMAL LOW (ref 39.0–52.0)
HCT: 31 % — ABNORMAL LOW (ref 39.0–52.0)
HCT: 32 % — ABNORMAL LOW (ref 39.0–52.0)
HCT: 33 % — ABNORMAL LOW (ref 39.0–52.0)
Hemoglobin: 10.2 g/dL — ABNORMAL LOW (ref 13.0–17.0)
Hemoglobin: 10.9 g/dL — ABNORMAL LOW (ref 13.0–17.0)
Hemoglobin: 12.2 g/dL — ABNORMAL LOW (ref 13.0–17.0)
Hemoglobin: 12.6 g/dL — ABNORMAL LOW (ref 13.0–17.0)
Hemoglobin: 9.5 g/dL — ABNORMAL LOW (ref 13.0–17.0)
Potassium: 3.8 mEq/L (ref 3.5–5.1)
Potassium: 4 mEq/L (ref 3.5–5.1)
Potassium: 4.1 mEq/L (ref 3.5–5.1)
Potassium: 4.4 mEq/L (ref 3.5–5.1)
Potassium: 4.4 mEq/L (ref 3.5–5.1)
Sodium: 133 mEq/L — ABNORMAL LOW (ref 135–145)
Sodium: 133 mEq/L — ABNORMAL LOW (ref 135–145)
Sodium: 138 mEq/L (ref 135–145)
Sodium: 138 mEq/L (ref 135–145)
Sodium: 140 mEq/L (ref 135–145)
Sodium: 141 mEq/L (ref 135–145)
Sodium: 141 mEq/L (ref 135–145)
Sodium: 143 mEq/L (ref 135–145)

## 2011-03-29 LAB — URINALYSIS, ROUTINE W REFLEX MICROSCOPIC
Bilirubin Urine: NEGATIVE
Glucose, UA: NEGATIVE mg/dL
Hgb urine dipstick: NEGATIVE
Specific Gravity, Urine: 1.024 (ref 1.005–1.030)
Urobilinogen, UA: 1 mg/dL (ref 0.0–1.0)
pH: 6.5 (ref 5.0–8.0)

## 2011-03-29 LAB — POCT I-STAT 3, ART BLOOD GAS (G3+)
Acid-Base Excess: 2 mmol/L (ref 0.0–2.0)
Acid-base deficit: 2 mmol/L (ref 0.0–2.0)
Bicarbonate: 25.8 mEq/L — ABNORMAL HIGH (ref 20.0–24.0)
Bicarbonate: 25.9 mEq/L — ABNORMAL HIGH (ref 20.0–24.0)
O2 Saturation: 100 %
O2 Saturation: 100 %
O2 Saturation: 97 %
TCO2: 25 mmol/L (ref 0–100)
TCO2: 28 mmol/L (ref 0–100)
TCO2: 28 mmol/L (ref 0–100)
TCO2: 35 mmol/L (ref 0–100)
pCO2 arterial: 37.7 mmHg (ref 35.0–45.0)
pCO2 arterial: 38.8 mmHg (ref 35.0–45.0)
pCO2 arterial: 45.2 mmHg — ABNORMAL HIGH (ref 35.0–45.0)
pCO2 arterial: 47.6 mmHg — ABNORMAL HIGH (ref 35.0–45.0)
pCO2 arterial: 67.2 mmHg (ref 35.0–45.0)
pH, Arterial: 7.302 — ABNORMAL LOW (ref 7.350–7.450)
pH, Arterial: 7.332 — ABNORMAL LOW (ref 7.350–7.450)
pH, Arterial: 7.337 — ABNORMAL LOW (ref 7.350–7.450)
pO2, Arterial: 102 mmHg — ABNORMAL HIGH (ref 80.0–100.0)
pO2, Arterial: 233 mmHg — ABNORMAL HIGH (ref 80.0–100.0)
pO2, Arterial: 363 mmHg — ABNORMAL HIGH (ref 80.0–100.0)
pO2, Arterial: 81 mmHg (ref 80.0–100.0)

## 2011-03-29 LAB — CBC
HCT: 38.1 % — ABNORMAL LOW (ref 39.0–52.0)
Hemoglobin: 13.5 g/dL (ref 13.0–17.0)
MCHC: 35.4 g/dL (ref 30.0–36.0)
MCHC: 35.6 g/dL (ref 30.0–36.0)
MCV: 89.9 fL (ref 78.0–100.0)
MCV: 90.2 fL (ref 78.0–100.0)
Platelets: 122 10*3/uL — ABNORMAL LOW (ref 150–400)
Platelets: 157 10*3/uL (ref 150–400)
RBC: 4.22 MIL/uL (ref 4.22–5.81)
RBC: 4.53 MIL/uL (ref 4.22–5.81)
RBC: 4.94 MIL/uL (ref 4.22–5.81)
RDW: 13.3 % (ref 11.5–15.5)
WBC: 12.2 10*3/uL — ABNORMAL HIGH (ref 4.0–10.5)
WBC: 19.2 10*3/uL — ABNORMAL HIGH (ref 4.0–10.5)

## 2011-03-29 LAB — PROTIME-INR: INR: 1.12 (ref 0.00–1.49)

## 2011-03-29 LAB — POCT I-STAT, CHEM 8
BUN: 10 mg/dL (ref 6–23)
Calcium, Ion: 1.16 mmol/L (ref 1.12–1.32)
HCT: 39 % (ref 39.0–52.0)
Hemoglobin: 13.3 g/dL (ref 13.0–17.0)
Sodium: 142 mEq/L (ref 135–145)
TCO2: 24 mmol/L (ref 0–100)

## 2011-03-29 LAB — GLUCOSE, CAPILLARY
Glucose-Capillary: 131 mg/dL — ABNORMAL HIGH (ref 70–99)
Glucose-Capillary: 138 mg/dL — ABNORMAL HIGH (ref 70–99)
Glucose-Capillary: 140 mg/dL — ABNORMAL HIGH (ref 70–99)
Glucose-Capillary: 150 mg/dL — ABNORMAL HIGH (ref 70–99)
Glucose-Capillary: 79 mg/dL (ref 70–99)

## 2011-03-29 LAB — COMPREHENSIVE METABOLIC PANEL
ALT: 44 U/L (ref 0–53)
AST: 20 U/L (ref 0–37)
Albumin: 4.2 g/dL (ref 3.5–5.2)
CO2: 23 mEq/L (ref 19–32)
Calcium: 9.7 mg/dL (ref 8.4–10.5)
GFR calc Af Amer: >60 mL/min (ref >60)
GFR calc non Af Amer: >60 mL/min (ref >60)
Sodium: 135 mEq/L (ref 135–145)
Total Protein: 6.8 g/dL (ref 6.0–8.3)

## 2011-03-29 LAB — HEMOGLOBIN AND HEMATOCRIT, BLOOD
HCT: 31.9 % — ABNORMAL LOW (ref 39.0–52.0)
Hemoglobin: 11.3 g/dL — ABNORMAL LOW (ref 13.0–17.0)

## 2011-03-29 LAB — POCT I-STAT 3, VENOUS BLOOD GAS (G3P V)
O2 Saturation: 94 %
TCO2: 27 mmol/L (ref 0–100)

## 2011-03-29 LAB — TYPE AND SCREEN
ABO/RH(D): O POS
Antibody Screen: NEGATIVE

## 2011-03-29 LAB — CREATININE, SERUM
GFR calc Af Amer: >60 mL/min (ref >60)
GFR calc non Af Amer: >60 mL/min (ref >60)

## 2011-03-29 LAB — PLATELET COUNT: Platelets: 151 10*3/uL (ref 150–400)

## 2011-03-29 LAB — MAGNESIUM
Magnesium: 2.3 mg/dL (ref 1.5–2.5)
Magnesium: 2.6 mg/dL — ABNORMAL HIGH (ref 1.5–2.5)

## 2011-03-29 LAB — MRSA PCR SCREENING: MRSA by PCR: NEGATIVE

## 2011-03-30 LAB — POCT I-STAT 3, VENOUS BLOOD GAS (G3P V)
Acid-base deficit: 3 mmol/L — ABNORMAL HIGH (ref 0.0–2.0)
Bicarbonate: 22.7 mEq/L (ref 20.0–24.0)
pH, Ven: 7.352 — ABNORMAL HIGH (ref 7.250–7.300)

## 2011-03-30 LAB — POCT I-STAT 3, ART BLOOD GAS (G3+)
Bicarbonate: 24.3 mEq/L — ABNORMAL HIGH (ref 20.0–24.0)
O2 Saturation: 98 %
pCO2 arterial: 36.7 mmHg (ref 35.0–45.0)
pO2, Arterial: 94 mmHg (ref 80.0–100.0)

## 2011-05-10 NOTE — Assessment & Plan Note (Signed)
OFFICE VISIT   [name], [name]  DOB:  1956/08/04                                        December 21, 2009  CHART #:  [id number]   HISTORY OF PRESENT ILLNESS:  The patient returns to the office today for  routine followup status post right miniature thoracotomy for aortic  valve replacement with a bioprosthetic tissue valve on December 08, 2009.  The patient's postoperative recovery has been uncomplicated.  Following hospital discharge, he has continued to do fairly well.  His  biggest complaint at this point stems from the fact that ever since  surgery, he has had some mild difficulty swallowing and hoarseness of  his voice presumably related to trauma to the larynx and vocal cords at  the time of endotracheal intubation and surgery.  He otherwise is  getting along very well.  He has very mild residual soreness in his  right anterior chest and he is now only occasionally taking pain  medication as needed.  He has not had any shortness of breath.  He has  not had any tachy palpitations or dizzy spells.  His appetite is good  and he is eating reasonably well although he still continues to have  some intermittent problems swallowing, particularly when he is  swallowing thin liquids.  He has learned to drink liquids sitting  straight up and using a straw and this seems to help.  The remainder of  his review of systems is remarkable.   CURRENT MEDICATIONS:  Aspirin 81 mg daily, metoprolol 12.5 mg twice  daily, and Ultram as needed for pain.   PHYSICAL EXAMINATION:  GENERAL:  Notable for well-appearing male with  blood pressure 100/70, pulse 80 and regular, oxygen saturation 95% on  room air.  CHEST:  Miniature anterior thoracotomy incision that is healing nicely.  There is no surrounding swelling or cellulitis.  Auscultation reveals  clear breath sounds with slightly diminished breath sounds at the right  lung base.  No wheezes or rhonchi are noted.  CARDIOVASCULAR:  Notable for regular rate and rhythm.  There is a grade  2/6 systolic murmur heard best along the right sternal border.  No  diastolic murmurs are noted.  ABDOMEN:  Soft, nontender.  EXTREMITIES:  Warm and well perfused.  Small left groin incision and  very small stab incision in the right groin are both healing nicely.  Distal pulses are palpable.  The remainder of his physical exam is  unrevealing.   DIAGNOSTIC TEST:  Chest x-ray performed today at the [facility] is reviewed.  This demonstrates clear lung fields bilaterally  with mild residual atelectasis at the right lung base, improved from his  most recent x-ray prior to hospital discharge.  There are no significant  pleural effusions.  No other abnormalities are noted.   IMPRESSION:  Satisfactory progress following recent right miniature  anterior thoracotomy for aortic valve replacement.  The patient still  have some mild hoarseness of voice and difficulty swallowing presumably  related to laryngeal trauma from intubation and transesophageal  echocardiogram at the time of surgery.  This seems to be getting better  and otherwise he is doing quite well.   PLAN:  I have encouraged the patient to continue to gradually increase  his physical activity as tolerated with his only significant limitation  at this point remaining that he refrain from any heavy lifting or  strenuous use of his arms and shoulders for at least another 6-8 weeks.  I think that probably within the next week he could return driving an  automobile, although I have suggested that he stick to driving short  distances during daylight initially.  We have discussed how over the  next few weeks he might gradually increase his physical activity  otherwise, and we specifically discussed a variety of questions that he  has in this respect.  We have not made any changes in his current  medications.  We will plan to see him back in 8 weeks.   I have reminded  him that he should schedule an appointment to see Dr. Eden Emms at some  point within the next month or so.   [name], M.D.  Electronically Signed   CHO/MEDQ  D:  12/21/2009  T:  12/22/2009  Job:  [id number]   cc:   Noralyn Pick. Eden Emms, MD, FACC  L. Lupe Carney, M.D.

## 2011-05-10 NOTE — Assessment & Plan Note (Signed)
OFFICE VISIT   [name], [name]  DOB:  1956/09/20                                        February 22, 2010  CHART #:  [id number]   HISTORY OF PRESENT ILLNESS:  The patient returns for routine followup  status post right miniature anterior thoracotomy for aortic valve  replacement on December 08, 2009.  He was last seen here in the office  on January 04, 2010.  Since then, he has done very well.  The patient  had previously had problems with hoarseness of his voice, and he was  initially referred to Dr. Osborn Coho for referral here in  [place].  He was then referred to [facility] where he was seen by Dr. Kandee Keen.  He was noted to  have some weakness of his right vocal cord which already had shown  significant signs of improvement.  Since then, the patient's voice has  continued to improve, and he is now back to work.  He has enjoyed  excellent recovery of his exercise tolerance, and in fact he states that  he now feels better than he did prior to surgery.  He is eager to  continue to increase his physical activity.  He is not having any  significant pain in his chest.  He is not having any shortness of  breath.  He was seen in follow up by Dr. Eden Emms, and a 2-D  echocardiogram was performed on January 06, 2010.  This was notable for  the presence of normal left ventricular function with a peak velocity  across the aortic valve of 373 cm per second.  There was a mild  perivalvular leak, as noted at the time of surgery.  The remainder of  the patient's review of systems is unremarkable.  The remainder of his  past medical history is unchanged.   CURRENT MEDICATIONS:  Aspirin 81 mg daily.   PHYSICAL EXAMINATION:  Notable for well-appearing male with blood  pressure 149/87, pulse 79, oxygen saturation 98% on room air.  Examination of the chest reveals a miniature anterior thoracotomy  incision that has healed  nicely.  I do not appreciate any significant  motion of the costal cartilages with cough or deep inspiration on either  side.  Auscultation of the chest reveals clear breath sounds which are  symmetrical bilaterally.  No wheezes or rhonchi are noted.  Cardiovascular exam is notable for regular rate and rhythm.  There is a  grade 3/6 systolic murmur heard along the sternal border.  I do not  appreciate any diastolic murmurs on exam.  The abdomen is soft,  nontender.  The extremities are warm and well perfused.  There is no  lower extremity edema.  The right groin incision has healed completely.  Distal pulses are intact.   DIAGNOSTIC TESTS:  Chest x-ray performed today at the [facility] is reviewed.  This demonstrates clear lung fields bilaterally.  There are no pleural effusions of any significance.  Previous  atelectasis in the right lung has now virtually completely resolved.   IMPRESSION:  The patient is doing very well now 2-1/2 months status post  right miniature anterior thoracotomy for aortic valve replacement.  Findings at the time of surgery were notable for a severely calcified  aortic root and aortic annulus,  and the largest valve that could be  inserted was 21 mm.  The elevated velocity and gradient across the valve  is consistent with this size prosthesis in a patient with likely  relatively high cardiac output.  He did have a small perivalvular aortic  leak noted at the time of surgery which was carefully evaluated with  transesophageal echocardiogram during surgery.  At that time, a decision  was made not to go back and replace the valve as the leak itself  appeared to be quite small and probably not significant.  Followup  transthoracic echocardiogram performed last month does confirm that mild  perivalvular leak persists.  This will obviously need to be followed  carefully in the future.   PLAN:  We will have the patient return to see [place] in 4 months.   He is  scheduled to see Dr. Eden Emms back for follow up in July, and a followup  echocardiogram is scheduled for that time.  He has been advised to avoid  heavy lifting or straining with his arms or shoulders for at least  another few weeks, after which time he can gradually increase his  activity as tolerated.  All of his questions have been addressed.    [name], M.D.  Electronically Signed   CHO/MEDQ  D:  02/22/2010  T:  02/23/2010  Job:  [id number]   cc:   Noralyn Pick. Eden Emms, MD, FACC  L. Lupe Carney, M.D.  Kinnie Scales. Annalee Genta, M.D.  Kandee Keen, MD

## 2011-05-10 NOTE — Assessment & Plan Note (Signed)
OFFICE VISIT   [name], [name]  DOB:  [birth year]                                        December 07, 2009  CHART #:  [id number]   The patient returns to the office today for further follow up with  tentative plans to proceed with aortic valve replacement tomorrow  morning.  He was originally seen in consultation on November 23, 2009,  and a full consultation note and history and physical exam were dictated  at that time.  Since then, he underwent CT angiogram of the thoracic and  abdominal aorta.  These confirmed the presence of normal-caliber  ascending thoracic aorta.  There was no sign of any significant  atherosclerotic disease involving the descending thoracic and abdominal  aorta with her branches.  CT scan did also reveal very small (4 mm)  noncalcified nodule in the posterolateral aspect of the right lower  lobe.  Because the patient is at very low risk, no further follow up is  recommended.   I spent in excess of 30 minutes reviewing the indications, risks, and  potential benefits of surgery with the patient and his wife in the  office again today.  The patient is very much interested in minimally  invasive approach for aortic valve replacement.  We discussed the  approach at length as well as the small but significant chance of need  to convert to either transverse sternotomy or full median sternotomy  depending upon intraoperative findings.  He understands that we plan to  replace his valve with a bioprosthetic tissue valve per his request, and  with this there is a potential risk for late structural valve  deterioration and failure depending upon his longevity.  He understands  and accepts all potential associated risks of surgery including but not  limited to risk of death, stroke, myocardial infarction, congestive  heart failure, respiratory failure, pneumonia, bleeding requiring blood  transfusion, arrhythmia, heart block with bradycardia  requiring  permanent pacemaker, complications, particularly related to femoral  artery cannulation and/or  mini thoracotomy approach.  All of their questions have been addressed.  We plan to proceed with surgery tomorrow as planned.   [name], M.D.  Electronically Signed   CHO/MEDQ  D:  12/07/2009  T:  12/08/2009  Job:  [id number]   cc:   Noralyn Pick. Eden Emms, MD, FACC  L. Lupe Carney, M.D.

## 2011-05-10 NOTE — Assessment & Plan Note (Signed)
OFFICE VISIT   [name], [name]  DOB:  1956-04-24                                        January 04, 2010  CHART #:  [id number]   HISTORY OF PRESENT ILLNESS:  The patient returns to the office today for  further follow up status post right miniature thoracotomy for aortic  valve replacement on December 08, 2009.  He was last seen here in the  office on December 21, 2009.  Since then, the patient has continued to  do very well with exception of the fact that his voice remains hoarse.  He states that otherwise he feels fine.  He has not had significant pain  at all in his chest.  He has not had much of any shortness of breath and  his exercise tolerance has continued to improve.  His appetite is  normal.  Overall, he feels fine except for the fact that his voice is  still hoarse.  He denies any difficulty swallowing, and he states that  he has not had problems with coughing after he eats or drinks, except on  a very occasional basis.  Overall, this seems improved.  He did have  some mild difficulty swallowing early after the surgery.  This has  resolved.  He has not had any fevers, chills, or productive cough.  He  is concerned because he has to talk a lot when he is back at work and he  has to make numerous phone calls and this may impact his ability to get  back to work which he hopes to do in the very near future.  He is eager  to seek further diagnostic evaluation to get a better feeling for why he  is having so much trouble with his hoarseness of voice.  He otherwise  feels fine.  He was seen in follow up by Dr. Eden Emms in the office last  week and he has been scheduled for a routine followup echocardiogram  soon.  The remainder of his review of systems is unremarkable.   CURRENT MEDICATIONS:  1. Lopressor 12.5 mg daily.  2. Aspirin 325 mg daily.  3. Ultram as needed for pain.   PHYSICAL EXAMINATION:  Notable for well-appearing male with blood  pressure  130/75, pulse 84, oxygen saturation 96% on room air.  Examination of the chest reveals a miniature anterolateral thoracotomy  incision that is healing nicely.  Auscultation reveals clear breath  sounds that are symmetrical bilaterally.  Cardiovascular exam is notable  for regular rate and rhythm.  There is a grade 2/6 systolic murmur heard  best along the sternal border.  No diastolic murmurs are noted.  The  abdomen is soft, nontender.  Bowel sounds are present.  The extremities  warm and well perfused.   IMPRESSION:  Overall, the patient is doing very well now only 1 month  status post miniature thoracotomy for aortic valve replacement.  He has  had persistent hoarseness of his voice that dates back to the time of  this surgery.  Presumably, this is related to trauma of his vocal cords  or larynx at the time of surgery.  The possibility of recurrent nerve  injury cannot be definitively ruled out, but this would be relatively  unlikely based upon the procedure he had performed.   PLAN:  We will make arrangements for  the patient to be seen in  consultation by Dr. Osborn Coho to consider laryngoscopy.  Otherwise, I have encouraged the patient to continue to gradually  increase his physical activity as tolerated.  At this point, his only  significant restriction is that he should avoid any heavy lifting or  strenuous use of his arms or shoulders.  We will plan to see him back in  1 months' time.   [name], M.D.  Electronically Signed   CHO/MEDQ  D:  01/04/2010  T:  01/05/2010  Job:  [id number]   cc:   Noralyn Pick. Eden Emms, MD, [facility]  Kinnie Scales. Annalee Genta, M.D.  Elsworth Soho, M.D.

## 2011-05-10 NOTE — Assessment & Plan Note (Signed)
OFFICE VISIT   [name], [name]  DOB:  May 09, 1956                                        September 06, 2010  CHART #:  [id number]   HISTORY OF PRESENT ILLNESS:  The patient returns for followup status  post right miniature anterior thoracotomy for aortic valve replacement  on December 08, 2009.  He was last seen here in the office on February 22, 2010.  He has a known perivalvular leak for which he has been  completely asymptomatic.  Over the last 6 months, the patient has done  very well.  He has returned to completely normal physical activity and  he notes that his exercise tolerance is considerably better than ever  was prior to his surgery.  He exercises very strenuously and very  regularly, and he has kept meticulous track of his progress.  His  aerobic exercise tolerance is demonstrably better than it was prior to  aortic valve replacement.  He absolutely denies any shortness of breath  either with activity or at rest.  The remainder of his review of systems  is unremarkable.  The remainder of his past medical history is  unchanged.  He was seen recently in followup by Dr. Eden Emms and a repeat  echocardiogram was performed.  By report, the perivalvular leak did not  change.  There is some question of a slight drop in ejection fraction.  No other abnormalities were noted.   CURRENT MEDICATIONS:  1. Aspirin 325 mg daily.  2. Ramipril 2.5 mg daily.   PHYSICAL EXAMINATION:  Notable for a well-appearing male with blood  pressure 120/72, pulse 67, and oxygen saturation 98% on room air.  Examination of the chest reveals clear breath sounds that are  symmetrical bilaterally.  No wheezes, rales, or rhonchi are noted.  The  mini thoracotomy incision has healed completely.  Auscultation of the  heart is notable for regular rate and rhythm.  There is a grade 2-3/6  crescendo-decrescendo systolic murmur heard best along the left lower  sternal border.  No  diastolic murmurs are noted.  The abdomen is soft  and nontender.  The extremities are warm and well perfused.  There is no  lower extremity edema.  The remainder of his physical exam is  unrevealing.   DIAGNOSTIC TESTS:  A 2-D echocardiogram performed on July 14, 2010, is  reviewed and compared with previous echocardiogram from January 06, 2010.  This demonstrates no significant change in the appearance of the  bioprosthetic aortic valve or the associated perivalvular leak.  There  has been some impression that there may be a drop of ejection fraction  in the neighborhood of 40-45%.  The left ventricle size does not appear  to be substantially different, although the end-systolic left  ventricular internal diameter was measured at the upper limits of normal  (38 mm) in comparison with 30 mm at the time of the last previous exam.  No other abnormalities are noted.   IMPRESSION:  The patient continues to do very well physically.  His  exercise tolerance is better than it was prior to surgery.  He still has  a significant perivalvular leak.  I do not hear a diastolic murmur on  exam, and the jet of aortic insufficiency does not fill the entire left  ventricle.  Most recent  echocardiogram does suggest the possibility of  slight interval increase in left ventricular chamber size and a slight  decrease in ejection fraction.  At this point, there remains no clear  indications that repeat surgical intervention are necessary, but a  continued trend towards LV chamber enlargement or further drop in LV  function would be worriesome.   PLAN:  I agree that close continued followup is appropriate, but I would  be reluctant to recommend repeat surgical intervention in the absence of  the development of symptoms related to his perivalvular leak and/or  substantial clear evidence of progression of the leak or deterioration  of left ventricular function.  I agree with addition of an ACE  inhibitor,  which the patient would benefit from any way given his  history of hypertension.  I have encouraged him to stick with Dr.  Fabio Bering recommendation.  We will plan to see him back in 6 months after  his next followup echo has been completed.   [name], M.D.  Electronically Signed   CHO/MEDQ  D:  09/06/2010  T:  09/07/2010  Job:  [id number]   cc:   Noralyn Pick. Eden Emms, MD, FACC  L. Lupe Carney, M.D.

## 2011-05-10 NOTE — H&P (Signed)
HISTORY AND PHYSICAL EXAMINATION   November 23, 2009   Re:  [name], [name]        DOB:  1956/10/18   REQUESTING PHYSICIAN:  Noralyn Pick. Eden Emms, MD, [facility]   PRIMARY CARE PHYSICIAN:  L. Lupe Carney, MD   REASON FOR CONSULTATION:  Severe aortic stenosis.   HISTORY OF PRESENT ILLNESS:  The patient is a 55 year old gentleman from  [place] with known history of bicuspid aortic valve and severe aortic  stenosis.  The patient states that he was first noted to have a heart  murmur probably 7 or 8 years ago.  For several years, he was followed by  Dr. Harland Dingwall.  He was told that he had aortic stenosis and that  ultimately it would probably progress to the point where he might need  surgery.  Dr. Shelva Majestic retired from clinical practice, and the patient  subsequently was evaluated by Dr. [name].  A followup  echocardiogram was performed on August 26, 2009, at the [facility]  Cardiology Office.  This revealed progression of aortic stenosis to the  point of being quite severe.  Specifically, the peak velocity across the  aortic valve measured 483 cm/sec corresponding to peak and mean  transvalvular gradients of 93 and 52 mmHg respectively.  The aortic  valve area was estimated 0.7 sq. cm both by V-max and by VTI methods.  Left ventricular systolic function remains preserved with ejection  fraction of 55%.  There is moderate left ventricular hypertrophy.  There  is mild mitral regurgitation.  No other abnormalities were noted.  The  patient subsequently underwent left and right heart catheterization by  Dr. Eden Emms on November 16, 2009.  This revealed the presence of normal  coronary artery anatomy with no significant coronary artery disease.  Pulmonary artery pressures measured 24/12 with a pulmonary capillary  wedge pressure of 13.  The patient has been referred for possible  elective aortic valve replacement.   REVIEW OF SYSTEMS:  GENERAL:  The patient reports  normal appetite.  He  has probably gained 5-10 pounds of weight over the last year due to  decreasing physical activity.  CARDIAC:  Notable for the absence of any chest pain, chest pressure, or  chest tightness either with activity or at rest.  The patient does admit  to mild exertional shortness of breath and fatigue, but this only occurs  when he is pushing himself physically.  He does note that his exercise  tolerance has definitely dropped over the past year, however.  He denies  any resting shortness of breath, PND, orthopnea, or lower extremity  edema.  He has not had any tachy palpitations or syncopal episodes.  RESPIRATORY:  Negative.  The patient denies productive cough,  hemoptysis, wheezing.  GASTROINTESTINAL:  Negative.  The patient has no difficulty swallowing.  He denies hematochezia, hematemesis, melena.  GENITOURINARY:  Negative.  The patient denies difficulty urinating or  urgency or frequency.  PERIPHERAL VASCULAR:  Negative.  MUSCULOSKELETAL:  Negative.  HEENT:  Notable for some decreased hearing in both ears.  The patient  wears glasses for correct vision.  He has no loose teeth or dental  problems, and he sees his dentist on a regular basis.   PAST MEDICAL HISTORY:  1. Bicuspid aortic valve with severe aortic stenosis.  2. GE reflux disease.   PAST SURGICAL HISTORY:  Right inguinal hernia repair during childhood.   FAMILY HISTORY:  Noncontributory.   SOCIAL HISTORY:  The patient is married and lives  with his wife here in  [place].  They have 2 children who are grown, the youngest is a  Archivist.  The patient enjoys regular physical exercise and lives  an active physical lifestyle.  He is a Information systems manager.  He is a nonsmoker, and he denies alcohol consumption.   CURRENT MEDICATIONS:  None.   DRUG ALLERGIES:  None known.   PHYSICAL EXAMINATION:  General:  The patient is a well-appearing male  who appears his stated age or  perhaps somewhat younger, in no acute  distress.  Vital Signs:  Blood pressure 147/84, pulse 74, oxygen  saturation 97% on room air.  HEENT:  Unrevealing.  Neck:  Supple.  There  is no cervical nor supraclavicular lymphadenopathy.  There is no jugular  venous distention.  No carotid bruits are noted.  Chest:  Auscultation  of the chest demonstrates clear breath sounds which are symmetrical  bilaterally.  No wheezes or rhonchi are demonstrated.  Cardiovascular:  Notable for regular rate and rhythm.  There is a 3/6 crescendo-  decrescendo systolic murmur heard best along the right sternal border  with radiation up towards the neck.  No diastolic murmurs are noted.  Abdomen:  Soft, nondistended, nontender.  There are no palpable masses.  Bowel sounds are present.  Extremities:  Warm and well perfused.  There  is no lower extremity edema.  Distal pulses are easily palpable in both  lower legs at the groin.  There is a hematoma in the right groin from  recent catheterization that appears to be resolving appropriately.  Rectal and GU:  Both deferred.  Neurologic:  Grossly nonfocal and  symmetrical throughout.   DIAGNOSTIC TESTS:  A 2-D echocardiogram performed on August 26, 2009,  is reviewed.  This confirms the presence of severe aortic stenosis with  what appeared to be bicuspid native aortic valve.  The aortic root  appears normal size and measures less than 4 cm in diameter but at least  25 mm in diameter at the annulus.  There is no significant aortic  regurgitation.  Left ventricular function is preserved.  There is mild  mitral regurgitation.   Left and right heart catheterization performed on November 16, 2009, is  reviewed.  This demonstrates normal coronary artery anatomy with no  significant coronary artery disease.  There is right-dominant coronary  circulation.  The aortic valve is heavily calcified.  The gradient  across the valve was not assessed.  The aortic root appears  essentially  normal with mild dilatation of the ascending aorta.  There does not  appear to be significant aortic regurgitation.   IMPRESSION:  Bicuspid aortic valve with severe aortic stenosis.  I agree  that the patient would best be treated with elective aortic valve  replacement.  He has become symptomatic over the past year, and his  valve area is in the neighborhood of 0.7 by recent 2-D echocardiogram.  He otherwise remains healthy, has normal left ventricular function, and  appears to be a good surgical candidate.   PLAN:  I discussed alternatives at length with the patient and his wife  here in the office today.  We discussed long-term prognosis with  continued medical therapy versus proceeding with surgery.  We discussed  the rationale for surgery at this juncture and all associated risks.  We  discussed a variety of surgical alternatives.  We particularly discussed  whether or not we would replace his valve using a mechanical prosthesis  or use some sort of tissue valve alternatives.  The relative risks and  benefits of each of these approaches have been reviewed in detail and  all of his questions have been addressed.  He specifically requests that  we replace his valve using a bioprosthetic tissue valve.  He understands  that because of his age this will come with some potential for late  structural valve deterioration and failure, potentially requiring repeat  surgery at some point in the future.  He understands this implicitly.  We also discussed a variety of surgical approaches including  conventional aortic valve replacement using a bioprosthetic tissue  valve, minimally invasive approach for surgery, or possibility of a Ross  procedure as a final alternative.  The relative risks and merits of each  of these approaches have been discussed.  The patient is particularly  interested in the minimally invasive approach via mini thoracotomy if  this is feasible.  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  Based upon his catheterization, I suspect that this will not be  necessary.  All of his questions have been addressed.  We tentatively  plan to proceed with  surgery on Tuesday, December 08, 2009.  The patient will plan to return  to see [place] here in the office on Monday, December 07, 2009, for final  consultation prior to surgery.   [name], M.D.  Electronically Signed   CHO/MEDQ  D:  11/23/2009  T:  11/24/2009  Job:  [id number]   cc:   L. Lupe Carney, M.D.  Noralyn Pick. Eden Emms, MD, [facility]

## 2011-06-30 ENCOUNTER — Other Ambulatory Visit: Payer: Self-pay | Admitting: *Deleted

## 2011-07-01 ENCOUNTER — Encounter: Payer: Self-pay | Admitting: Cardiology

## 2011-07-14 ENCOUNTER — Telehealth: Payer: Self-pay | Admitting: Cardiovascular Disease

## 2011-07-14 DIAGNOSIS — I359 Nonrheumatic aortic valve disorder, unspecified: Secondary | ICD-10-CM

## 2011-07-14 NOTE — Telephone Encounter (Signed)
Addended by: Freddi Starr on: 07/14/2011 02:54 PM   Modules accepted: Orders

## 2011-07-14 NOTE — Telephone Encounter (Signed)
Call patient to inform him of his appointment for cardiac mri on 7/24/2 @ 9 a.m. Patient has questions,  would like for the nurse to call him back to discuss.

## 2011-07-14 NOTE — Telephone Encounter (Signed)
Spoke with pt, he does not want an MRI at this time. He would prefer to do an echo and follow up with dr Eden Emms Deliah Goody

## 2011-07-19 ENCOUNTER — Other Ambulatory Visit (HOSPITAL_COMMUNITY): Payer: Self-pay

## 2011-08-11 ENCOUNTER — Encounter: Payer: Self-pay | Admitting: *Deleted

## 2011-08-22 ENCOUNTER — Ambulatory Visit (HOSPITAL_COMMUNITY): Payer: BC Managed Care – PPO | Attending: Cardiology | Admitting: Radiology

## 2011-08-22 ENCOUNTER — Ambulatory Visit (INDEPENDENT_AMBULATORY_CARE_PROVIDER_SITE_OTHER): Payer: BC Managed Care – PPO | Admitting: Cardiovascular Disease

## 2011-08-22 ENCOUNTER — Encounter: Payer: Self-pay | Admitting: Cardiovascular Disease

## 2011-08-22 DIAGNOSIS — I359 Nonrheumatic aortic valve disorder, unspecified: Secondary | ICD-10-CM

## 2011-08-22 DIAGNOSIS — Q231 Congenital insufficiency of aortic valve: Secondary | ICD-10-CM

## 2011-08-22 DIAGNOSIS — I509 Heart failure, unspecified: Secondary | ICD-10-CM

## 2011-08-22 DIAGNOSIS — I079 Rheumatic tricuspid valve disease, unspecified: Secondary | ICD-10-CM | POA: Insufficient documentation

## 2011-08-22 DIAGNOSIS — I059 Rheumatic mitral valve disease, unspecified: Secondary | ICD-10-CM | POA: Insufficient documentation

## 2011-08-22 DIAGNOSIS — Z954 Presence of other heart-valve replacement: Secondary | ICD-10-CM | POA: Insufficient documentation

## 2011-08-22 NOTE — Patient Instructions (Signed)
Your physician wants you to follow-up in: ONE YEAR You will receive a reminder letter in the mail two months in advance. If you don't receive a letter, please call our office to schedule the follow-up appointment.  

## 2011-08-23 DIAGNOSIS — I5040 Unspecified combined systolic (congestive) and diastolic (congestive) heart failure: Secondary | ICD-10-CM | POA: Insufficient documentation

## 2011-08-23 NOTE — Assessment & Plan Note (Signed)
DCM post op with EF anywhere from 35-45%.  No CAD preop.  Patient will let me know if he is willing to try Cozaar 25mg .  Concern for LVE/ with AR and high gradients across valve

## 2011-08-23 NOTE — Assessment & Plan Note (Signed)
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  F/U echo in a year.  SBE prophylaxis

## 2011-08-23 NOTE — Progress Notes (Signed)
[name] is seen today to review his echo. 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 Post op echo 1/11 showed normal EF. He had mild AR likely related to the annular calcification. F/U echo 7/11 showed EF 40-45% MRI done last year showed  EF 34% with diffuse hypokinesis. Had no CAD at cath. Echo today continues to show moderate LVE with global hypokinesis worse in septum and apex.  EF 45%  Mild -moderate prosthetic AR. He has not liked being on ACE due to postural symptoms.  Discussed the importance of neurohumoral blockade with decreased EF  Answered multiple quesitons regarding diagnosis of cardiomyopathy including prognosis, etiology and F/U  Also discussed issues with needing a second valve surgery.  Given suboptimal results from this surgery I suspect it will be sooner than we would like.  Apparently Dr Cornelius Moras discussed situation with them immediately post op as there was a known perprosthetic leak from dense calcium and decision was mad not to put back on pump to put in prosthetic valve.  Patient had strong feelings preop about avoiding coumadin.  ROS: Denies fever, malais, weight loss, blurry vision, decreased visual acuity, cough, sputum, SOB, hemoptysis, pleuritic pain, palpitaitons, heartburn, abdominal pain, melena, lower extremity edema, claudication, or rash.  All other systems reviewed and negative  General: Affect appropriate Healthy:  appears stated age HEENT: normal Neck supple with no adenopathy JVP normal no bruits no thyromegaly Lungs clear with no wheezing and good diaphragmatic motion Heart:  S1/S2 SEM through bioprosthetic AVR  No AR  Murmur audible no ,rub, gallop or click PMI normal Abdomen: benighn, BS positve, no tenderness, no AAA no bruit.  No HSM or HJR Distal pulses intact with no bruits No edema Neuro non-focal Skin warm and dry No muscular weakness   Current Outpatient Prescriptions  Medication Sig Dispense Refill  . amoxicillin (AMOXIL) 500  MG tablet Take 4 tablets one hour before dental procedure as needed       . aspirin 81 MG tablet Take 81 mg by mouth daily.        . Fish Oil OIL Take 1 tablet by mouth daily.          Allergies  Review of patient's allergies indicates no known allergies.  Electrocardiogram:  Assessment and Plan

## 2011-10-11 ENCOUNTER — Encounter (INDEPENDENT_AMBULATORY_CARE_PROVIDER_SITE_OTHER): Payer: Self-pay | Admitting: General Surgery

## 2011-10-11 ENCOUNTER — Ambulatory Visit (INDEPENDENT_AMBULATORY_CARE_PROVIDER_SITE_OTHER): Payer: BC Managed Care – PPO | Admitting: General Surgery

## 2011-10-11 VITALS — BP 130/76 | HR 60 | Temp 97.0°F | Resp 16 | Ht 72.0 in | Wt 188.4 lb

## 2011-10-11 DIAGNOSIS — K4090 Unilateral inguinal hernia, without obstruction or gangrene, not specified as recurrent: Secondary | ICD-10-CM | POA: Insufficient documentation

## 2011-10-11 NOTE — Patient Instructions (Signed)
Please call us if you would like to schedule surgery.

## 2011-10-11 NOTE — Progress Notes (Signed)
Chief Complaint  Patient presents with  . Other    new pt eval of [facility]     HPI [name] is a 55 y.o. male.   HPI  He is referred by Dr. Clovis Riley for evaluation of a left inguinal hernia. He does a lot of hiking and walking and noticed a left inguinal bulge. He is aware that it is there but it does not cause him pain. He saw Dr. Clovis Riley back in July who diagnosed him with a left inguinal hernia. He had a previous right inguinal hernia repair as a young child. He is here discussed possible treatments for his hernia. He has done some research online. He is aware of the Shouldice repair as well as repairs with mesh. He denies straining to urinate. He denies constipation. No chronic cough.  Past Medical History  Diagnosis Date  . Murmur   . Rosacea   . Hernia   . Reflux   . Hearing loss     bilateral, pt wears hearing aids     Past Surgical History  Procedure Date  . Aortic valve replacement     Procedure: Right miniature anterior thoracotomy for aortic valve replacement (32-mm [facility] Ease pericardial tissue valve) Surgeon: [name], MD assistant: Kerin Perna, MD  . Inguinal hernia repair     Left  . Inguinal hernia repair 1960    right side    History reviewed. No pertinent family history.  Social History History  Substance Use Topics  . Smoking status: Never Smoker   . Smokeless tobacco: Never Used  . Alcohol Use: 0.6 oz/week    1 Cans of beer per week    No Known Allergies  Current Outpatient Prescriptions  Medication Sig Dispense Refill  . amoxicillin (AMOXIL) 500 MG tablet Take 4 tablets one hour before dental procedure as needed       . aspirin 81 MG tablet Take 81 mg by mouth daily.        . Fish Oil OIL Take 1 tablet by mouth daily.          Review of Systems Review of Systems  Constitutional: Negative.   Respiratory: Negative for shortness of breath.   Cardiovascular: Negative.   Genitourinary: Negative for difficulty urinating and  testicular pain.    Blood pressure 130/76, pulse 60, temperature 97 F (36.1 C), resp. rate 16, height 6' (1.829 m), weight 188 lb 6 oz (85.446 kg).  Physical Exam Physical Exam  Constitutional: He appears well-developed and well-nourished. No distress.  Neck: Neck supple.  Cardiovascular: Normal rate and regular rhythm.   Murmur heard. Pulmonary/Chest: Effort normal and breath sounds normal.  Abdominal: Soft. He exhibits no distension. There is no tenderness.       Small, reducible umbilical hernia is noted.  Genitourinary:       Right groin scars present with no evidence of bulge or hernia. Left inguinal bulge is present which is reducible. No testicular masses.  Musculoskeletal: Normal range of motion. He exhibits no edema.  Lymphadenopathy:    He has no cervical adenopathy.    Data Reviewed Notes from Dr. Clovis Riley. I also reviewed with Dr. Eden Emms note.  Assessment    Left inguinal hernia that is reducible and minimally symptomatic. Options were discussed with him. Different types of repair were also discussed with him. I recommended a mesh repair. I discussed with them the open repair and laparoscopic repair.  I have explained the procedure, risks, and aftercare of  inguinal hernia repair.  Risks include but are not limited to bleeding, infection, wound problems, anesthesia, recurrence, bladder or intestine injury, urinary retention, testicular dysfunction, chronic pain, mesh problems.  He seems to understand. His wife was present as well    Plan      He was to go home and think about it. If he does have the repair, he is leaning toward the open procedure. We'll make sure that there are no cardiac contraindication to this.    Monteen Toops J 10/11/2011, 5:48 PM

## 2012-02-07 ENCOUNTER — Encounter: Payer: Self-pay | Admitting: *Deleted

## 2012-02-08 ENCOUNTER — Telehealth: Payer: Self-pay | Admitting: Cardiovascular Disease

## 2012-02-08 NOTE — Telephone Encounter (Signed)
PT AWARE SENT LAST EKG ,OFFICE NOTE, AND PRE OP CLEARANCE LETTER TO FACILITY  IN  [place]./[place]

## 2012-02-08 NOTE — Telephone Encounter (Signed)
FU Call: Please return pt call to discuss further.

## 2012-08-23 ENCOUNTER — Ambulatory Visit: Payer: BC Managed Care – PPO | Admitting: Cardiovascular Disease

## 2012-08-23 ENCOUNTER — Other Ambulatory Visit (HOSPITAL_COMMUNITY): Payer: BC Managed Care – PPO

## 2012-08-29 ENCOUNTER — Encounter: Payer: Self-pay | Admitting: *Deleted

## 2012-08-30 ENCOUNTER — Other Ambulatory Visit (HOSPITAL_COMMUNITY): Payer: Self-pay | Admitting: Cardiovascular Disease

## 2012-08-30 ENCOUNTER — Ambulatory Visit (INDEPENDENT_AMBULATORY_CARE_PROVIDER_SITE_OTHER): Payer: BC Managed Care – PPO | Admitting: Cardiovascular Disease

## 2012-08-30 ENCOUNTER — Encounter: Payer: Self-pay | Admitting: Cardiovascular Disease

## 2012-08-30 ENCOUNTER — Ambulatory Visit (HOSPITAL_COMMUNITY): Payer: BC Managed Care – PPO | Attending: Cardiology | Admitting: Radiology

## 2012-08-30 VITALS — BP 144/90 | HR 66 | Ht 72.0 in | Wt 189.1 lb

## 2012-08-30 DIAGNOSIS — R0602 Shortness of breath: Secondary | ICD-10-CM

## 2012-08-30 DIAGNOSIS — I509 Heart failure, unspecified: Secondary | ICD-10-CM | POA: Insufficient documentation

## 2012-08-30 DIAGNOSIS — I079 Rheumatic tricuspid valve disease, unspecified: Secondary | ICD-10-CM | POA: Insufficient documentation

## 2012-08-30 DIAGNOSIS — K4090 Unilateral inguinal hernia, without obstruction or gangrene, not specified as recurrent: Secondary | ICD-10-CM

## 2012-08-30 DIAGNOSIS — I359 Nonrheumatic aortic valve disorder, unspecified: Secondary | ICD-10-CM

## 2012-08-30 DIAGNOSIS — Z952 Presence of prosthetic heart valve: Secondary | ICD-10-CM

## 2012-08-30 DIAGNOSIS — I080 Rheumatic disorders of both mitral and aortic valves: Secondary | ICD-10-CM | POA: Insufficient documentation

## 2012-08-30 DIAGNOSIS — Z954 Presence of other heart-valve replacement: Secondary | ICD-10-CM

## 2012-08-30 DIAGNOSIS — I379 Nonrheumatic pulmonary valve disorder, unspecified: Secondary | ICD-10-CM | POA: Insufficient documentation

## 2012-08-30 MED ORDER — LISINOPRIL 5 MG PO TABS: 5.0000 mg | ORAL_TABLET | Freq: Every day | ORAL | Status: DC

## 2012-08-30 NOTE — Progress Notes (Addendum)
Patient ID: [name], male   DOB: January 26, 1956, 56 y.o.   MRN: [id number] [name] is seen today to review his echo. 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 Post op echo 1/11 showed normal EF. He had mild AR likely related to the annular calcification. F/U echo 7/11 showed EF 40-45% MRI done last year showed EF 34% with diffuse hypokinesis. Had no CAD at cath. Echo today continues to show moderate LVE with global hypokinesis worse in septum and apex. EF 40%  Mild prosthetic AR. Has had posturlal symptoms with ACE in past.  Answered multiple quesitons regarding diagnosis of cardiomyopathy including prognosis, etiology and F/U Also discussed issues with needing a second valve surgery. Given suboptimal results from this surgery I suspect it will be sooner than we would like. 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 Patient had strong feelings preop about avoiding coumadin.  Has noted more fatigue and exertional dyspnea Thinks his cardiac "output" is less.  Cant hike as far as he could a year ago.  No presyncope, TIA , chest pain or palpitations  Echo today reviewed:  Study Conclusions  - Left ventricle: Septal and apical hypokinesis The cavity size was severely dilated. Wall thickness was normal. Systolic function was moderately reduced. The estimated ejection fraction was in the range of 35% to 40%. - Aortic valve: Tissue AVR. Increased systolic gradients. Mild periprosthetic regurgitation. Mean gradient: 45mm Hg (S). Peak gradient: 67mm Hg (S). - Mitral valve: Mild regurgitation. - Left atrium: The atrium was mildly dilated. - Atrial septum: No defect or patent foramen ovale was identified. - Pulmonary arteries: PA peak pressure: 37mm Hg (S).  Echo 08/22/11:  Mean gradient 44 mmHg, Peak 72 mmHg and peak CW velocity  4.23 Echo 08/30/12:   Meand gradient 45 mmHg, Peak 67 mmHg and peak CW velocity 4.1   ROS: Denies fever, malais, weight loss, blurry vision, decreased visual acuity, cough, sputum, SOB, hemoptysis, pleuritic pain, palpitaitons, heartburn, abdominal pain, melena, lower extremity edema, claudication, or rash.  All other systems reviewed and negative  General: Affect appropriate Healthy:  appears stated age HEENT: normal Neck supple with no adenopathy JVP normal no bruits no thyromegaly Lungs clear with no wheezing and good diaphragmatic motion Heart:  S1/S2 AS/AR murmur , no rub, gallop or click PMI normal Abdomen: benighn, BS positve, no tenderness, no AAA no bruit.  No HSM or HJR Distal pulses intact with no bruits No edema Neuro non-focal Skin warm and dry No muscular weakness   Current Outpatient Prescriptions  Medication Sig Dispense Refill  . amoxicillin (AMOXIL) 500 MG tablet Take 4 tablets one hour before dental procedure as needed       . aspirin 81 MG tablet Take 81 mg by mouth daily.        . Fish Oil OIL Take 1 tablet by mouth daily.          Allergies  Review of patient's allergies indicates no known allergies.  Electrocardiogram:SR rate 66 LAD LVH   Assessment and Plan

## 2012-08-30 NOTE — Progress Notes (Signed)
Echocardiogram performed.  

## 2012-08-30 NOTE — Assessment & Plan Note (Signed)
Euvolemic.  EF stable by echo.  MRI with no scar and has had DCM since AVR.  Add ACE if tolerated.

## 2012-08-30 NOTE — Patient Instructions (Addendum)
Your physician recommends that you schedule a follow-up appointment in: November WITH DR Eden Emms AFTER  CPX Your physician has recommended you make the following change in your medication: LINISOPRIL 5 MG 1 TAB HS Your physician has recommended that you have a cardiopulmonary stress test (CPX). CPX testing is a non-invasive measurement of heart and lung function. It replaces a traditional treadmill stress test. This type of test provides a tremendous amount of information that relates not only to your present condition but also for future outcomes. This test combines measurements of you ventilation, respiratory gas exchange in the lungs, electrocardiogram (EKG), blood pressure and physical response before, during, and following an exercise protocol. DX SOB

## 2012-08-30 NOTE — Assessment & Plan Note (Signed)
Wants hernia repaired without mesh.  Dr in [place] can do this.  He has no CAD  And no clinical CHF.  EF stable by echo Clear to have surgery which he indicates may be done under local.

## 2012-08-30 NOTE — Assessment & Plan Note (Signed)
Suboptimal outcome AVR done 12/08/09.  Due to severe calcification of native bicuspid valve and annulus could only size a 21 mm magna ease valve.  TEE intraop showed peri-valvular leak that has persisted.  Not clear if elevated gradients are due to small size of valve and aorta or tissue leaflet malfunction.  Worrisome that gradeints this high even with decreased EF.  Stable since echo done last year.  Has had decreased exercise tolerance.  Will try to quantitate or show objective evidence of decreased cardiac output with cardiopulmonary stress test.  Will rechallenge with low dose ACE since BP seems higher.  At some point may need to refer back to Dr Cornelius Moras for opinion regarding long term plan.

## 2012-08-31 ENCOUNTER — Telehealth: Payer: Self-pay | Admitting: *Deleted

## 2012-08-31 NOTE — Telephone Encounter (Signed)
RECEIVED FAX NUMBER FROM PT  LAST OFFICE NOTE FAXED  PER PT'S REQUEST .[name]

## 2012-08-31 NOTE — Telephone Encounter (Signed)
SPOKE WITH PT RE GETTING FAX NUMBER FOR MD  DOING HERNIA REPAIR WITHOUT MESH IN [place]  WILL CALL BACK ONCE RECEIVES INFO./CY

## 2012-09-03 ENCOUNTER — Telehealth: Payer: Self-pay | Admitting: Cardiovascular Disease

## 2012-09-03 NOTE — Telephone Encounter (Signed)
PT AWARE DID RECEIVE FAX NUMBER AND FAXED LAST OFFICE NOTE TO SAID NUMBER   COPY OF E MAIL WAS SENT TO  MEDICAL RECORDS TO BE SCANNED  INTO PT'S CHART .[name]

## 2012-09-03 NOTE — Telephone Encounter (Signed)
Pt sent fax number to christine through our web page on 08-31-12, wants to know if you received it, re   surgical clearance, pt [phone]

## 2012-09-04 ENCOUNTER — Ambulatory Visit (HOSPITAL_COMMUNITY): Payer: BC Managed Care – PPO | Attending: Cardiovascular Disease

## 2012-09-04 DIAGNOSIS — R0602 Shortness of breath: Secondary | ICD-10-CM | POA: Insufficient documentation

## 2012-09-10 ENCOUNTER — Telehealth: Payer: Self-pay | Admitting: *Deleted

## 2012-09-10 NOTE — Telephone Encounter (Signed)
         Lucilla Lame ','<More Detail >>       Wendall Stade, MD       Sent:  Caleen Essex September 07, 2012 12:22 PM                 Message     Does not appear to be any cardiac output limitations to exercise. Lung and heart ouput normal during exercise         Attached Reports     The sender attached the following reports to this message:   Order report 1         Pt aware of cpx results./cy

## 2012-10-31 ENCOUNTER — Ambulatory Visit (INDEPENDENT_AMBULATORY_CARE_PROVIDER_SITE_OTHER): Payer: BC Managed Care – PPO | Admitting: Cardiovascular Disease

## 2012-10-31 ENCOUNTER — Encounter: Payer: Self-pay | Admitting: Cardiovascular Disease

## 2012-10-31 VITALS — BP 118/76 | HR 64 | Ht 72.0 in | Wt 187.0 lb

## 2012-10-31 DIAGNOSIS — R0602 Shortness of breath: Secondary | ICD-10-CM

## 2012-10-31 DIAGNOSIS — Z954 Presence of other heart-valve replacement: Secondary | ICD-10-CM

## 2012-10-31 DIAGNOSIS — Z952 Presence of prosthetic heart valve: Secondary | ICD-10-CM

## 2012-10-31 NOTE — Assessment & Plan Note (Signed)
S/P tissue AVR at patient request ( as opposed to mechanical) high gradients stable.  CardPulm stess test with ventilatory limitation not cardiac SBE prophylaxis  Saw dentist yesterday  F/U echo in a year

## 2012-10-31 NOTE — Assessment & Plan Note (Signed)
Functional at this point  No change in heart murmur.  Lungs clear.  He is back to hiking

## 2012-10-31 NOTE — Progress Notes (Signed)
Patient ID: [name], male   DOB: 29-Oct-1956, 56 y.o.   MRN: [id number] [name] is seen today to review his echo. 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 Post op echo 1/11 showed normal EF. He had mild AR likely related to the annular calcification. F/U echo 7/11 showed EF 40-45% MRI done last year showed EF 34% with diffuse hypokinesis. Had no CAD at cath. Echo today continues to show moderate LVE with global hypokinesis worse in septum and apex. EF 40% Mild prosthetic AR. Has had posturlal symptoms with ACE in past. Answered multiple quesitons regarding diagnosis of cardiomyopathy including prognosis, etiology and F/U Also discussed issues with needing a second valve surgery. Given suboptimal results from this surgery I suspect it will be sooner than we would like. 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 Patient had strong feelings preop about avoiding coumadin. Has noted more fatigue and exertional dyspnea Thinks his cardiac "output" is less. Cant hike as far as he could a year ago. No presyncope, TIA , chest pain or palpitations  Echo today reviewed:  Study Conclusions  - Left ventricle: Septal and apical hypokinesis The cavity size was severely dilated. Wall thickness was normal. Systolic function was moderately reduced. The estimated ejection fraction was in the range of 35% to 40%. - Aortic valve: Tissue AVR. Increased systolic gradients. Mild periprosthetic regurgitation. Mean gradient: 45mm Hg (S). Peak gradient: 67mm Hg (S). - Mitral valve: Mild regurgitation. - Left atrium: The atrium was mildly dilated. - Atrial septum: No defect or patent foramen ovale was identified. - Pulmonary arteries: PA peak pressure: 37mm Hg (S).  Echo 08/22/11: Mean gradient 44 mmHg, Peak 72 mmHg and peak CW velocity 4.23  Echo  08/30/12: Meand gradient 45 mmHg, Peak 67 mmHg and peak CW velocity 4.1   Cardiopulmonary stress test with more ventilatory limitation 09/04/12  Conclusion: Exercise testing with gas exchange demonstrates a normal functional capacity when compared to matched sedentary norms. This also represents no change in functional capacity from his previous test in Jan 2012. Resting spirometry reveals mild obstruction. At peak exercise, patient appears to be limited most by his ventilation.    ROS: Denies fever, malais, weight loss, blurry vision, decreased visual acuity, cough, sputum, SOB, hemoptysis, pleuritic pain, palpitaitons, heartburn, abdominal pain, melena, lower extremity edema, claudication, or rash.  All other systems reviewed and negative  General: Affect appropriate Healthy:  appears stated age HEENT: normal Neck supple with no adenopathy JVP normal no bruits no thyromegaly Lungs clear with no wheezing and good diaphragmatic motion Heart:  S1/S2 SEM  murmur, no rub, gallop or click PMI normal Abdomen: benighn, BS positve, no tenderness, no AAA no bruit.  No HSM or HJR Distal pulses intact with no bruits No edema Neuro non-focal Skin warm and dry  Recent right groin hernia repair No muscular weakness   Current Outpatient Prescriptions  Medication Sig Dispense Refill  . amoxicillin (AMOXIL) 500 MG tablet Take 4 tablets one hour before dental procedure as needed       . aspirin 81 MG tablet Take 81 mg by mouth daily.        . Fish Oil OIL Take 1 tablet by mouth daily.        Marland Kitchen lisinopril (PRINIVIL,ZESTRIL) 5 MG tablet Take 1 tablet (5 mg total) by  mouth daily.  30 tablet  11  . Multiple Vitamin (MULTIVITAMIN) tablet Take 1 tablet by mouth daily.        Allergies  Review of patient's allergies indicates no known allergies.  Electrocardiogram:  NSR rate 66 LAD LVH done 08/30/12  Assessment and Plan

## 2012-10-31 NOTE — Patient Instructions (Signed)
Your physician wants you to follow-up in:  6 MONTHS WITH DR NISHAN  You will receive a reminder letter in the mail two months in advance. If you don't receive a letter, please call our office to schedule the follow-up appointment. Your physician recommends that you continue on your current medications as directed. Please refer to the Current Medication list given to you today. 

## 2012-11-13 ENCOUNTER — Other Ambulatory Visit: Payer: Self-pay | Admitting: *Deleted

## 2012-11-13 MED ORDER — LISINOPRIL 5 MG PO TABS: 5.0000 mg | ORAL_TABLET | Freq: Every day | ORAL | Status: DC

## 2013-01-08 ENCOUNTER — Other Ambulatory Visit: Payer: Self-pay | Admitting: Dermatology

## 2013-06-10 ENCOUNTER — Encounter: Payer: Self-pay | Admitting: Cardiovascular Disease

## 2013-07-11 ENCOUNTER — Telehealth: Payer: Self-pay | Admitting: Cardiovascular Disease

## 2013-07-11 DIAGNOSIS — I509 Heart failure, unspecified: Secondary | ICD-10-CM

## 2013-07-11 NOTE — Telephone Encounter (Signed)
New Problem  Pt wants to know when he will have his next echo.

## 2013-07-11 NOTE — Telephone Encounter (Signed)
Spoke with patient who states he always has his ECHO on the same day as his ov with Dr. Eden Emms.  Patient states he is not aware of any upcoming appointments.  I advised patient that he has an appointment scheduled for 9/17 @ 2:45 with Dr. Eden Emms.  Patient verbalized agreement with this plan and asked to please schedule his ECHO for the same day.  I advised patient that I will send request for ECHO to be scheduled for  same day as ov to [facility]. Patient verbalized understanding and agreement with plan. ECHO ordered in EPIC

## 2013-07-11 NOTE — Telephone Encounter (Signed)
LMTCB home and mobile

## 2013-07-12 NOTE — Telephone Encounter (Signed)
SEE APPTS ECHO SCHEDULED .[name]

## 2013-07-26 HISTORY — PX: TOOTH EXTRACTION: SUR596

## 2013-09-11 ENCOUNTER — Encounter: Payer: Self-pay | Admitting: Cardiovascular Disease

## 2013-09-11 ENCOUNTER — Ambulatory Visit (INDEPENDENT_AMBULATORY_CARE_PROVIDER_SITE_OTHER): Payer: BC Managed Care – PPO | Admitting: Cardiovascular Disease

## 2013-09-11 ENCOUNTER — Encounter: Payer: Self-pay | Admitting: *Deleted

## 2013-09-11 ENCOUNTER — Other Ambulatory Visit: Payer: Self-pay | Admitting: *Deleted

## 2013-09-11 ENCOUNTER — Ambulatory Visit (HOSPITAL_COMMUNITY): Payer: BC Managed Care – PPO | Attending: Cardiology | Admitting: Radiology

## 2013-09-11 VITALS — BP 122/84 | HR 74 | Wt 186.0 lb

## 2013-09-11 DIAGNOSIS — Z952 Presence of prosthetic heart valve: Secondary | ICD-10-CM

## 2013-09-11 DIAGNOSIS — I509 Heart failure, unspecified: Secondary | ICD-10-CM | POA: Insufficient documentation

## 2013-09-11 DIAGNOSIS — Q231 Congenital insufficiency of aortic valve: Secondary | ICD-10-CM

## 2013-09-11 DIAGNOSIS — I359 Nonrheumatic aortic valve disorder, unspecified: Secondary | ICD-10-CM

## 2013-09-11 DIAGNOSIS — Z954 Presence of other heart-valve replacement: Secondary | ICD-10-CM

## 2013-09-11 DIAGNOSIS — Z01811 Encounter for preprocedural respiratory examination: Secondary | ICD-10-CM

## 2013-09-11 DIAGNOSIS — Z01818 Encounter for other preprocedural examination: Secondary | ICD-10-CM

## 2013-09-11 DIAGNOSIS — I428 Other cardiomyopathies: Secondary | ICD-10-CM | POA: Insufficient documentation

## 2013-09-11 LAB — BASIC METABOLIC PANEL
BUN: 12 mg/dL (ref 6–23)
CO2: 26 mEq/L (ref 19–32)
Calcium: 9.3 mg/dL (ref 8.4–10.5)
Creatinine, Ser: 0.9 mg/dL (ref 0.4–1.5)

## 2013-09-11 LAB — CBC WITH DIFFERENTIAL/PLATELET
Basophils Relative: 0.5 % (ref 0.0–3.0)
Eosinophils Absolute: 0.4 10*3/uL (ref 0.0–0.7)
Lymphocytes Relative: 37.7 % (ref 12.0–46.0)
MCHC: 34.6 g/dL (ref 30.0–36.0)
MCV: 88.1 fl (ref 78.0–100.0)
Monocytes Absolute: 0.5 10*3/uL (ref 0.1–1.0)
Neutrophils Relative %: 50.3 % (ref 43.0–77.0)
Platelets: 170 10*3/uL (ref 150.0–400.0)
RBC: 5.08 Mil/uL (ref 4.22–5.81)
WBC: 7.4 10*3/uL (ref 4.5–10.5)

## 2013-09-11 LAB — PROTIME-INR
INR: 1.1 ratio — ABNORMAL HIGH (ref 0.8–1.0)
Prothrombin Time: 11.8 s (ref 10.2–12.4)

## 2013-09-11 NOTE — Patient Instructions (Signed)
Your physician has requested that you have a TEE. During a TEE, sound waves are used to create images of your heart. It provides your doctor with information about the size and shape of your heart and how well your heart's chambers and valves are working. In this test, a transducer is attached to the end of a flexible tube that's guided down your throat and into your esophagus (the tube leading from you mouth to your stomach) to get a more detailed image of your heart. You are not awake for the procedure. Please see the instruction sheet given to you today. For further information please visit [email or web address]   Your physician has requested that you have a cardiac catheterization. Cardiac catheterization is used to diagnose and/or treat various heart conditions. Doctors may recommend this procedure for a number of different reasons. The most common reason is to evaluate chest pain. Chest pain can be a symptom of coronary artery disease (CAD), and cardiac catheterization can show whether plaque is narrowing or blocking your heart's arteries. This procedure is also used to evaluate the valves, as well as measure the blood flow and oxygen levels in different parts of your heart. For further information please visit [email or web address] Please follow instruction sheet, as given. RIGHT AND LEFT HEART CATH  Your physician recommends that you return for lab work in: TODAY BMET CBC  INR

## 2013-09-11 NOTE — Progress Notes (Signed)
Patient ID: [name], male   DOB: 08/23/1956, 57 y.o.   MRN: [id number] [name] is seen today to review his echo. 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 Post op echo 1/11 showed normal EF. He had mild AR likely related to the annular calcification. F/U echo 7/11 showed EF 40-45% MRI done last year showed EF 34% with diffuse hypokinesis. Had no CAD at cath. Echo today continues to show moderate LVE with global hypokinesis worse in septum and apex. EF 40% Mild prosthetic AR. Has had posturlal symptoms with ACE in past. Answered multiple quesitons regarding diagnosis of cardiomyopathy including prognosis, etiology and F/U Also discussed issues with needing a second valve surgery. Given suboptimal results from this surgery I suspect it will be sooner than we would like. 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 Patient had strong feelings preop about avoiding coumadin. Has noted more fatigue and exertional dyspnea Thinks his cardiac "output" is less. Cant hike as far as he could a year ago. No presyncope, TIA , chest pain or palpitations  Echo today reviewed:  Study Conclusions  - Left ventricle: Septal and apical hypokinesis The cavity size was severely dilated. Wall thickness was normal. Systolic function was moderately reduced. The estimated ejection fraction was in the range of 35% to 40%. - Aortic valve: Tissue AVR. Increased systolic gradients. Mild periprosthetic regurgitation. Mean gradient: 45mm Hg (S). Peak gradient: 67mm Hg (S). - Mitral valve: Mild regurgitation. - Left atrium: The atrium was mildly dilated. - Atrial septum: No defect or patent foramen ovale was identified. - Pulmonary arteries: PA peak pressure: 37mm Hg (S).  Echo 08/22/11: Mean gradient 44 mmHg, Peak 72 mmHg and peak CW velocity 4.23  Echo  08/30/12: Mean gradient 45 mmHg, Peak 67 mmHg and peak CW velocity 4.1  Echo 09/11/13  Mean gradient 60 mmHg and Peak 102 mmHg  And peak CW velocity 5  Cardiopulmonary stress test with more ventilatory limitation 09/04/12  Conclusion: Exercise testing with gas exchange demonstrates a normal functional capacity when compared to matched sedentary norms. This also represents no change in functional capacity from his previous test in Jan 2012. Resting spirometry reveals mild obstruction. At peak exercise, patient appears to be limited most by his ventilation.   Clinically is stable Went on 30 mile hike recently and does not complain of chest pain, objective dyspnea or pre syncope  Long discussion with patient and wife.  Also discussed case with Dr [name].  Concerned that gradient are so severe.  TTE leaflet motion does not appear that bad.  Think that TEE and right and left heart cath indicated   ROS: Denies fever, malais, weight loss, blurry vision, decreased visual acuity, cough, sputum, SOB, hemoptysis, pleuritic pain, palpitaitons, heartburn, abdominal pain, melena, lower extremity edema, claudication, or rash.  All other systems reviewed and negative  General: Affect appropriate Healthy:  appears stated age HEENT: normal Neck supple with no adenopathy JVP normal no bruits no thyromegaly Lungs clear with no wheezing and good diaphragmatic motion Heart:  S1/S2 AS  murmur, no rub, gallop or click PMI normal Abdomen: benighn, BS positve, no tenderness, no AAA no bruit.  No HSM or HJR Distal pulses intact with no bruits No edema Neuro non-focal Skin warm and dry No muscular weakness   Current Outpatient Prescriptions  Medication   Sig Dispense Refill  . amoxicillin (AMOXIL) 500 MG tablet Take 4 tablets one hour before dental procedure as needed       . aspirin 81 MG tablet Take 81 mg by mouth daily.        . Fish Oil OIL Take 1 tablet by mouth daily.        . lisinopril  (PRINIVIL,ZESTRIL) 5 MG tablet Take 1 tablet (5 mg total) by mouth daily.  90 tablet  3   No current facility-administered medications for this visit.    Allergies  Review of patient's allergies indicates no known allergies.  Electrocardiogram:  SR rate 74 LAD ICRBBB LVH   Assessment and Plan  

## 2013-09-11 NOTE — Assessment & Plan Note (Signed)
Clinically stable  Will not advance ACE given AS.  Some segmental RWMA on echo in septum and apex  Cath to r/o CAD and assess filling pressures

## 2013-09-11 NOTE — Assessment & Plan Note (Signed)
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  Original surgery for bicuspid valve difficult due to annular calcification and has always had a mild perivalvular leak that is stable.  He is still adament about not having a mechanical valve and does not want to be on coumadin.  Original surgery 12/10 note indicated a 23mm sizer would not fit through STJ and he has a 21 mm Magna Ease pericardial valve After cath will set him up to see Dr Cornelius Moras in [facility] clinic ? If he may eventually be a candidate for valve in valve procedure.  This would not help perivalvular leak but may benefit stenosis especially in light of decreased EF  Arrangements for TEE and cath as well as labs and orders done

## 2013-09-11 NOTE — Progress Notes (Signed)
Echocardiogram performed.  

## 2013-09-12 ENCOUNTER — Other Ambulatory Visit: Payer: Self-pay | Admitting: Cardiovascular Disease

## 2013-09-12 DIAGNOSIS — Z952 Presence of prosthetic heart valve: Secondary | ICD-10-CM

## 2013-09-13 ENCOUNTER — Telehealth: Payer: Self-pay

## 2013-09-13 NOTE — Telephone Encounter (Signed)
Received a call from [facility] stating patient has appointment with Dr.Owen 09/23/13 at 11:00 am arrive at 10:45 am.Patient was called and notified of appointment.

## 2013-09-18 ENCOUNTER — Other Ambulatory Visit: Payer: Self-pay | Admitting: *Deleted

## 2013-09-18 ENCOUNTER — Encounter (HOSPITAL_COMMUNITY): Payer: Self-pay | Admitting: Pharmacy Technician

## 2013-09-19 ENCOUNTER — Encounter (HOSPITAL_COMMUNITY): Payer: Self-pay

## 2013-09-19 ENCOUNTER — Observation Stay (HOSPITAL_COMMUNITY)
Admission: RE | Admit: 2013-09-19 | Discharge: 2013-09-20 | Disposition: A | Discharge status: Home / Self Care | Payer: BC Managed Care – PPO | Source: Ambulatory Visit | Attending: Cardiovascular Disease | Admitting: Cardiovascular Disease

## 2013-09-19 ENCOUNTER — Encounter (HOSPITAL_COMMUNITY)
Admission: RE | Disposition: A | Discharge status: Home / Self Care | Payer: Self-pay | Source: Ambulatory Visit | Attending: Cardiovascular Disease

## 2013-09-19 DIAGNOSIS — Z952 Presence of prosthetic heart valve: Secondary | ICD-10-CM

## 2013-09-19 DIAGNOSIS — I359 Nonrheumatic aortic valve disorder, unspecified: Principal | ICD-10-CM | POA: Insufficient documentation

## 2013-09-19 DIAGNOSIS — IMO0002 Reserved for concepts with insufficient information to code with codable children: Secondary | ICD-10-CM | POA: Insufficient documentation

## 2013-09-19 DIAGNOSIS — I10 Essential (primary) hypertension: Secondary | ICD-10-CM | POA: Insufficient documentation

## 2013-09-19 DIAGNOSIS — I428 Other cardiomyopathies: Secondary | ICD-10-CM | POA: Insufficient documentation

## 2013-09-19 DIAGNOSIS — Y658 Other specified misadventures during surgical and medical care: Secondary | ICD-10-CM | POA: Insufficient documentation

## 2013-09-19 HISTORY — DX: Other cardiomyopathies: I42.8

## 2013-09-19 HISTORY — PX: LEFT AND RIGHT HEART CATHETERIZATION WITH CORONARY ANGIOGRAM: SHX5449

## 2013-09-19 HISTORY — PX: TEE WITHOUT CARDIOVERSION: SHX5443

## 2013-09-19 HISTORY — DX: Nonrheumatic aortic valve disorder, unspecified: I35.9

## 2013-09-19 LAB — POCT I-STAT 3, VENOUS BLOOD GAS (G3P V)
Acid-base deficit: 4 mmol/L — ABNORMAL HIGH (ref 0.0–2.0)
Bicarbonate: 21.9 mEq/L (ref 20.0–24.0)
O2 Saturation: 68 %
TCO2: 23 mmol/L (ref 0–100)
pCO2, Ven: 42.1 mmHg — ABNORMAL LOW (ref 45.0–50.0)
pH, Ven: 7.325 — ABNORMAL HIGH (ref 7.250–7.300)

## 2013-09-19 LAB — POCT I-STAT 3, ART BLOOD GAS (G3+)
Acid-base deficit: 2 mmol/L (ref 0.0–2.0)
Bicarbonate: 23.2 mEq/L (ref 20.0–24.0)
TCO2: 24 mmol/L (ref 0–100)
pCO2 arterial: 40.3 mmHg (ref 35.0–45.0)
pH, Arterial: 7.368 (ref 7.350–7.450)
pO2, Arterial: 104 mmHg — ABNORMAL HIGH (ref 80.0–100.0)

## 2013-09-19 SURGERY — LEFT AND RIGHT HEART CATHETERIZATION WITH CORONARY ANGIOGRAM
Anesthesia: LOCAL

## 2013-09-19 SURGERY — ECHOCARDIOGRAM, TRANSESOPHAGEAL
Anesthesia: Moderate Sedation

## 2013-09-19 MED ORDER — OXYCODONE-ACETAMINOPHEN 5-325 MG PO TABS: 1.0000 | ORAL_TABLET | ORAL | Status: DC | PRN

## 2013-09-19 MED ORDER — HEPARIN (PORCINE) IN NACL 2-0.9 UNIT/ML-% IJ SOLN
INTRAMUSCULAR | Status: AC
  Filled 2013-09-19: qty 1000

## 2013-09-19 MED ORDER — SODIUM CHLORIDE 0.9 % IV SOLN: 250.0000 mL | INTRAVENOUS | Status: DC | PRN

## 2013-09-19 MED ORDER — MIDAZOLAM HCL 2 MG/2ML IJ SOLN
INTRAMUSCULAR | Status: AC
  Filled 2013-09-19: qty 2

## 2013-09-19 MED ORDER — NITROGLYCERIN 0.2 MG/ML ON CALL CATH LAB
INTRAVENOUS | Status: AC
  Filled 2013-09-19: qty 1

## 2013-09-19 MED ORDER — SODIUM CHLORIDE 0.9 % IJ SOLN
3.0000 mL | Freq: Two times a day (BID) | INTRAMUSCULAR | Status: DC
  Administered 2013-09-19: 3 mL via INTRAVENOUS

## 2013-09-19 MED ORDER — LIDOCAINE HCL (PF) 1 % IJ SOLN
INTRAMUSCULAR | Status: AC
  Filled 2013-09-19: qty 30

## 2013-09-19 MED ORDER — BUTAMBEN-TETRACAINE-BENZOCAINE 2-2-14 % EX AERO
INHALATION_SPRAY | CUTANEOUS | Status: DC | PRN
  Administered 2013-09-19: 2 via TOPICAL

## 2013-09-19 MED ORDER — ONDANSETRON HCL 4 MG/2ML IJ SOLN: 4.0000 mg | Freq: Four times a day (QID) | INTRAMUSCULAR | Status: DC | PRN

## 2013-09-19 MED ORDER — LISINOPRIL 5 MG PO TABS
5.0000 mg | ORAL_TABLET | Freq: Every day | ORAL | Status: DC
  Filled 2013-09-19 (×2): qty 1

## 2013-09-19 MED ORDER — ACETAMINOPHEN 325 MG PO TABS: 650.0000 mg | ORAL_TABLET | ORAL | Status: DC | PRN

## 2013-09-19 MED ORDER — SODIUM CHLORIDE 0.9 % IV SOLN: INTRAVENOUS | Status: DC

## 2013-09-19 MED ORDER — MIDAZOLAM HCL 10 MG/2ML IJ SOLN
INTRAMUSCULAR | Status: DC | PRN
  Administered 2013-09-19: 3 mg via INTRAVENOUS
  Administered 2013-09-19: 1 mg via INTRAVENOUS
  Administered 2013-09-19: 2 mg via INTRAVENOUS
  Administered 2013-09-19: 1 mg via INTRAVENOUS

## 2013-09-19 MED ORDER — ASPIRIN 81 MG PO CHEW: 324.0000 mg | CHEWABLE_TABLET | ORAL | Status: DC

## 2013-09-19 MED ORDER — SODIUM CHLORIDE 0.9 % IJ SOLN: 3.0000 mL | INTRAMUSCULAR | Status: DC | PRN

## 2013-09-19 MED ORDER — FENTANYL CITRATE 0.05 MG/ML IJ SOLN
INTRAMUSCULAR | Status: DC | PRN
  Administered 2013-09-19 (×2): 25 ug via INTRAVENOUS

## 2013-09-19 MED ORDER — ASPIRIN EC 325 MG PO TBEC: 325.0000 mg | DELAYED_RELEASE_TABLET | Freq: Every day | ORAL | Status: DC

## 2013-09-19 MED ORDER — ASPIRIN EC 81 MG PO TBEC
81.0000 mg | DELAYED_RELEASE_TABLET | Freq: Every day | ORAL | Status: DC
  Filled 2013-09-19: qty 1

## 2013-09-19 MED ORDER — HEPARIN (PORCINE) IN NACL 2-0.9 UNIT/ML-% IJ SOLN
INTRAMUSCULAR | Status: AC
  Filled 2013-09-19: qty 500

## 2013-09-19 MED ORDER — MIDAZOLAM HCL 5 MG/ML IJ SOLN
INTRAMUSCULAR | Status: AC
  Filled 2013-09-19: qty 2

## 2013-09-19 MED ORDER — SODIUM CHLORIDE 0.45 % IV SOLN: INTRAVENOUS | Status: AC

## 2013-09-19 MED ORDER — FENTANYL CITRATE 0.05 MG/ML IJ SOLN
INTRAMUSCULAR | Status: AC
  Filled 2013-09-19: qty 2

## 2013-09-19 NOTE — Progress Notes (Signed)
REPORT CALLED TO RN FOR [phone] AND TRANSFERRED VIA BED

## 2013-09-19 NOTE — Progress Notes (Signed)
   S: CTSP after he got up to walk after 5 hrs of bedrest and felt a pop @ his cath site followed by swelling and bleeding.  After 20 mins of manual pressure held by nsg staff, site is flat w/o hematoma, bleeding, or bruit.  He is tender to palpation over cath site.  No flank pain.  O:   Filed Vitals:   09/19/13 1735  BP: 105/65  Pulse: 57  Temp:   Resp:   Pleasant, NAD, AAOx3.  R groin w/o b/b/h.  Groin is tender.  No flank pain.  DP 2+ bilat.    A/P:  1.  R groin hematoma/bleeding:  Resolved after applying manual pressure.  Site looks good.  No bruit.  Will extend bedrest x 4 hrs and observe pt on tele tonight.  He and his wife are agreeable and prefer observation over late discharge.  F/U CBC in am.  Nicolasa Ducking, NP

## 2013-09-19 NOTE — Op Note (Signed)
TEE:  7mg  versed and 25 ug of fentanyl  EF 40-45% diffuse hypokinesis Mild MR Normal TV/PV Tissue AVR with moderate perivalvular leak (11:00-1:00)  Leaflet motion and mobility look fine.  Mean gradient Planimetry valve area 1.6 cm2 No LAA thrombus No ASD/PFO Normal Aorta    Tolerated procedure well  [name]

## 2013-09-19 NOTE — Progress Notes (Signed)
CLIENT UP AND WALKED AND BLEEDING NOTED RIGHT GROIN AND PA IN AND ORDER TO ADMIT

## 2013-09-19 NOTE — H&P (View-Only) (Signed)
Patient ID: [name], male   DOB: 1956/10/17, 57 y.o.   MRN: [id number] [name] is seen today to review his echo. 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 Post op echo 1/11 showed normal EF. He had mild AR likely related to the annular calcification. F/U echo 7/11 showed EF 40-45% MRI done last year showed EF 34% with diffuse hypokinesis. Had no CAD at cath. Echo today continues to show moderate LVE with global hypokinesis worse in septum and apex. EF 40% Mild prosthetic AR. Has had posturlal symptoms with ACE in past. Answered multiple quesitons regarding diagnosis of cardiomyopathy including prognosis, etiology and F/U Also discussed issues with needing a second valve surgery. Given suboptimal results from this surgery I suspect it will be sooner than we would like. 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 Patient had strong feelings preop about avoiding coumadin. Has noted more fatigue and exertional dyspnea Thinks his cardiac "output" is less. Cant hike as far as he could a year ago. No presyncope, TIA , chest pain or palpitations  Echo today reviewed:  Study Conclusions  - Left ventricle: Septal and apical hypokinesis The cavity size was severely dilated. Wall thickness was normal. Systolic function was moderately reduced. The estimated ejection fraction was in the range of 35% to 40%. - Aortic valve: Tissue AVR. Increased systolic gradients. Mild periprosthetic regurgitation. Mean gradient: 45mm Hg (S). Peak gradient: 67mm Hg (S). - Mitral valve: Mild regurgitation. - Left atrium: The atrium was mildly dilated. - Atrial septum: No defect or patent foramen ovale was identified. - Pulmonary arteries: PA peak pressure: 37mm Hg (S).  Echo 08/22/11: Mean gradient 44 mmHg, Peak 72 mmHg and peak CW velocity 4.23  Echo  08/30/12: Mean gradient 45 mmHg, Peak 67 mmHg and peak CW velocity 4.1  Echo 09/11/13  Mean gradient 60 mmHg and Peak 102 mmHg  And peak CW velocity 5  Cardiopulmonary stress test with more ventilatory limitation 09/04/12  Conclusion: Exercise testing with gas exchange demonstrates a normal functional capacity when compared to matched sedentary norms. This also represents no change in functional capacity from his previous test in Jan 2012. Resting spirometry reveals mild obstruction. At peak exercise, patient appears to be limited most by his ventilation.   Clinically is stable Went on 30 mile hike recently and does not complain of chest pain, objective dyspnea or pre syncope  Long discussion with patient and wife.  Also discussed case with Dr Excell Seltzer.  Concerned that gradient are so severe.  TTE leaflet motion does not appear that bad.  Think that TEE and right and left heart cath indicated   ROS: Denies fever, malais, weight loss, blurry vision, decreased visual acuity, cough, sputum, SOB, hemoptysis, pleuritic pain, palpitaitons, heartburn, abdominal pain, melena, lower extremity edema, claudication, or rash.  All other systems reviewed and negative  General: Affect appropriate Healthy:  appears stated age HEENT: normal Neck supple with no adenopathy JVP normal no bruits no thyromegaly Lungs clear with no wheezing and good diaphragmatic motion Heart:  S1/S2 AS  murmur, no rub, gallop or click PMI normal Abdomen: benighn, BS positve, no tenderness, no AAA no bruit.  No HSM or HJR Distal pulses intact with no bruits No edema Neuro non-focal Skin warm and dry No muscular weakness   Current Outpatient Prescriptions  Medication  Sig Dispense Refill  . amoxicillin (AMOXIL) 500 MG tablet Take 4 tablets one hour before dental procedure as needed       . aspirin 81 MG tablet Take 81 mg by mouth daily.        . Fish Oil OIL Take 1 tablet by mouth daily.        Marland Kitchen lisinopril  (PRINIVIL,ZESTRIL) 5 MG tablet Take 1 tablet (5 mg total) by mouth daily.  90 tablet  3   No current facility-administered medications for this visit.    Allergies  Review of patient's allergies indicates no known allergies.  Electrocardiogram:  SR rate 74 LAD ICRBBB LVH   Assessment and Plan

## 2013-09-19 NOTE — Progress Notes (Signed)
Echocardiogram Echocardiogram Transesophageal has been performed.  [name] 09/19/2013, 9:49 AM

## 2013-09-19 NOTE — CV Procedure (Signed)
   Cardiac Catheterization Procedure Note  Name: [name] MRN: [id number] DOB: 03-May-1956  Procedure: Right Heart Cath, Left Heart Cath, Selective Coronary Angiography, LV angiography  Indication:  Aortic stenosis/regurgitation post tissue AVR with 21mm Magna Ease bovine valve   Procedural Details: The right groin was prepped, draped, and anesthetized with 1% lidocaine. Using the modified Seldinger technique a 5 French sheath was placed in the right femoral artery and a 7 French sheath was placed in the right femoral vein. A Swan-Ganz catheter was used for the right heart catheterization. Standard protocol was followed for recording of right heart pressures and sampling of oxygen saturations. Fick cardiac output was calculated. Standard Judkins catheters were used for selective coronary angiography and left ventriculography. There were no immediate procedural complications. The patient was transferred to the post catheterization recovery area for further monitoring.  Procedural Findings: Hemodynamics RA 8 RV 30/4 PA 22/12 PCWP  14 LV 133/10 AO 105/63  Oxygen saturations: PA 68% AO 98%  Cardiac Output (Fick) 4.33  Cardiac Index (Fick)  2.1    AV peak to peak gradient 28 mmHg mean gradient 28.75mm Hg  Calculated AVA .93 mmHg   Coronary angiography: Coronary dominance: right  Left mainstem: Normal  Left anterior descending (LAD):  Normal  Left circumflex (LCx):  Normal  Right coronary artery (RCA):  Normal  Aortic Root:  Moderate to severe peri valvular regurgitation No aortic root dilatation.   Final Conclusions:  AS/AR Recommendations:  Will discuss further with Dr Cornelius Moras and Excell Seltzer.  By TEE the struts are not crimped and the leaflets look nice with planimetry area of 1.6cm2 High gradients (mean  and peak 102 mmHg) by echo may be related to small valve with significant regurgitation causing high CO.  Do not think valve in valve TAVR indicated with leaflets looking so  good.  Consider catheter based sealing of peri prosthetic regurgitation.     [name] 09/19/2013, 11:29 AM

## 2013-09-19 NOTE — Progress Notes (Signed)
Pt was able to void 500cc of clear, yellow urine.

## 2013-09-19 NOTE — Interval H&P Note (Signed)
History and Physical Interval Note:  09/19/2013 7:45 AM  [name]  has presented today for surgery, with the diagnosis of BICUSPID AORTIV VALVE  The various methods of treatment have been discussed with the patient and family. After consideration of risks, benefits and other options for treatment, the patient has consented to  Procedure(s): TRANSESOPHAGEAL ECHOCARDIOGRAM (TEE) (N/A) as a surgical intervention .  The patient's history has been reviewed, patient examined, no change in status, stable for surgery.  I have reviewed the patient's chart and labs.  Questions were answered to the patient's satisfaction.     [name]

## 2013-09-20 ENCOUNTER — Encounter (HOSPITAL_COMMUNITY): Payer: Self-pay | Admitting: Cardiovascular Disease

## 2013-09-20 DIAGNOSIS — Z954 Presence of other heart-valve replacement: Secondary | ICD-10-CM

## 2013-09-20 LAB — CBC
HCT: 44.6 % (ref 39.0–52.0)
MCHC: 35 g/dL (ref 30.0–36.0)
MCV: 86.1 fL (ref 78.0–100.0)
Platelets: 151 10*3/uL (ref 150–400)
RBC: 5.18 MIL/uL (ref 4.22–5.81)
RDW: 12.6 % (ref 11.5–15.5)

## 2013-09-20 NOTE — Progress Notes (Signed)
Patient ID: [name], male   DOB: [birth year], 57 y.o.   MRN: [id number]    Subjective:  Denies SSCP, palpitations or Dyspnea Anxious to go home  Objective:  Filed Vitals:   09/19/13 1735 09/19/13 1804 09/19/13 2017 09/20/13 0505  BP: 105/65 102/64 112/71 103/67  Pulse: 57 55 56 57  Temp:  98 F (36.7 C) 98.1 F (36.7 C) 97.9 F (36.6 C)  TempSrc:  Oral Oral Oral  Resp:  18 18 18   Height:      SpO2: 98% 98% 96% 95%    Intake/Output from previous day:  Intake/Output Summary (Last 24 hours) at 09/20/13 4098 Last data filed at 09/20/13 0500  Gross per 24 hour  Intake      0 ml  Output    601 ml  Net   -601 ml    Physical Exam: Affect appropriate Healthy:  appears stated age HEENT: normal Neck supple with no adenopathy JVP normal no bruits no thyromegaly Lungs clear with no wheezing and good diaphragmatic motion Heart:  S1/S2 harsh SEM through AVR murmur, no rub, gallop or click PMI normal Abdomen: benighn, BS positve, no tenderness, no AAA no bruit.  No HSM or HJR Distal pulses intact with no bruits No edema Neuro non-focal Skin warm and dry No muscular weakness RFA/RFV ok with no bruit   Lab Results: Basic Metabolic Panel:   Recent Labs  09/20/13 0500  WBC 9.2  HGB 15.6  HCT 44.6  MCV 86.1  PLT 151    Cardiac Studies:  ECG:  NSR LVH   Telemetry:  NSR no arrhythmia 09/20/2013   Echo:  TEE with moderate perivalvular leak despite high TTE gradients leaflets on AVR look fine  Medications:   . aspirin EC  81 mg Oral Daily  . lisinopril  5 mg Oral Daily  . sodium chloride  3 mL Intravenous Q12H     . sodium chloride      Assessment/Plan:  AVR:  To see Dr [name] Monday.  After TEE and cath I think best next approach is looking into cath based sealing of peri valvular regurgitation  Dr [name] to discuss with Dr [name] at [facility] clinic Hematoma:  Post cath bleed with no residual No pain or bruit stable  Hct 44.6 HTN:  Consider changing to  calcium blocker and d/c ACE in future given high gradients.  No lightheadedness or pre syncope  [name] 09/20/2013, 8:11 AM

## 2013-09-20 NOTE — Discharge Summary (Signed)
Discharge Summary   Patient ID: [name] MRN: [id number], DOB/AGE: [birth year] 57 y.o. Admit date: 09/19/2013 D/C date:     09/20/2013  Primary Cardiologist: Eden Emms  Primary Discharge Diagnoses:  1. Aortic stenosis/aortic regurgitation - s/p tissue AVR 2010  - echo 09/11/2013: high gradients c/w severe AS, mild perivalvular regurg -> TEE with mean gradient , planimetry valve area 1.6cm2, mod perivalvular leak & cath mean gradient , mod-sev perivalvular regurgitation 2. Post-cath groin hematoma 3. Nonischemic cardiomyopathy - EF 40-45% by echo/TEE 08/2013 - normal coronaries by cath 09/19/13 4. HTN  Secondary Discharge Diagnoses:  1. Rosacea 2. Reflux 3. Hearing loss, bilateral, wears hearing aids  Hospital Course: Mr. Farnan is a 57 y/o M with history of tissue AVR 2010 (normal cors at that time), LV dysfunction identified in 2011, & HTN who presented to [facility] 09/19/2013 for planned cath. He underwent AVR in 2010 for severe AS/bicuspid AV with heavy annular calcification at which time the patient requested tissue valve. He had had strong feelings preop about avoiding Coumadin. Post-op echo 12/2009 showed normal EF, mild AR likely related to the annular calcification. Followup echo 06/2010 showed EF 40-45% and then cardiac MRI 11/2010 showed EF 34%. He presented to the office last week for follow-up of a repeat echo demonstrating EF 40-45%, septal and apikal hypokinesis, mod dilated LV, tissue AVR with mild perivalvular regurgitation stable, gradient increased since last year consistent with severe setenosis (mean gradient , peak ), mild MR. He has had postural sx with ACE in the past. The patient has been able to go on long hikes, but now reports he can't go as far as he could a year ago. He has noted more fatigue and exertional dyspnea. Dr. Eden Emms felt his progressive gradient suggested a failing tissue valve. He notes "Original surgery for bicuspid valve  difficult due to annular calcification and has always had a mild perivalvular leak that is stable. He is still adament about not having a mechanical valve and does not want to be on coumadin. Original surgery 12/10 note indicated a 23mm sizer would not fit through STJ and he has a 21 mm Magna Ease pericardial valve." TEE/cath were recommended. The patient presented for these procedures yesterday. TEE demonstrated: EF 40-45% diffuse hypokinesis Mild MR  Normal TV/PV  Tissue AVR with moderate perivalvular leak (11:00-1:00) Leaflet motion and mobility look fine. Mean gradient  Planimetry valve area 1.6 cm2  No LAA thrombus  No ASD/PFO  Normal Aorta  Cardiac cath showed: Normal coronary angiography AV peak to peak gradient 28 mmHg mean gradient 28.57mm Hg Calculated AVA .93 mmHg  Aortic root indicated moderate to severe perivalvular regurgitation without aortic root dilitation Dr. Eden Emms reported that by TEE, the the struts were not crimped and the leaflets look nice with planimetry area of 1.6cm2. The high gradients by original echo may be related to small valve with significant regurgitation causing high CO. Dr. Eden Emms does not think valve in valve TAVR is indicated with leaflets looking so good, but would consider catheter-based sealing of periprosthetic regurgitation.  Post-cath, the patient noted a "pop" sensation at cath site with swelling/bleeding when he got up to walk after 5 hours of bedrest. R groin hematoma was identified with bleeding that resolved with manual pressure. The patient was observed overnight and sustained no complications. Hgb remained stable. Today he denies CP or dyspnea. Dr. Eden Emms has seen and examined the patient today and feels he is stable for discharge. The patient has  followup with Dr. Cornelius Moras on Monday 9/29, and Dr. Eden Emms would like to see him in 3 months. Dr. Eden Emms recommends to consider changing to calcium blocker and d/c ACEI in future given high gradients (pt  has not had any recent lightheadeness or syncope). Dr. Excell Seltzer plans to discuss the case further with Dr. Samule Ohm at [facility] clinic.   Discharge Vitals: Blood pressure 103/67, pulse 57, temperature 97.9 F (36.6 C), temperature source Oral, resp. rate 18, height 6' (1.829 m), SpO2 95.00%.  Labs: Lab Results  Component Value Date   WBC 9.2 09/20/2013   HGB 15.6 09/20/2013   HCT 44.6 09/20/2013   MCV 86.1 09/20/2013   PLT 151 09/20/2013    Diagnostic Studies/Procedures   Cardiac Cath 09/19/13 Procedure: Right Heart Cath, Left Heart Cath, Selective Coronary Angiography, LV angiography  Indication: Aortic stenosis/regurgitation post tissue AVR with 21mm Magna Ease bovine valve  Procedural Details: The right groin was prepped, draped, and anesthetized with 1% lidocaine. Using the modified Seldinger technique a 5 French sheath was placed in the right femoral artery and a 7 French sheath was placed in the right femoral vein. A Swan-Ganz catheter was used for the right heart catheterization. Standard protocol was followed for recording of right heart pressures and sampling of oxygen saturations. Fick cardiac output was calculated. Standard Judkins catheters were used for selective coronary angiography and left ventriculography. There were no immediate procedural complications. The patient was transferred to the post catheterization recovery area for further monitoring.  Procedural Findings:  Hemodynamics  RA 8  RV 30/4  PA 22/12  PCWP 14  LV 133/10  AO 105/63  Oxygen saturations:  PA 68%  AO 98%  Cardiac Output (Fick) 4.33  Cardiac Index (Fick) 2.1  AV peak to peak gradient 28 mmHg mean gradient 28.22mm Hg Calculated AVA .93 mmHg  Coronary angiography:  Coronary dominance: right  Left mainstem: Normal  Left anterior descending (LAD): Normal  Left circumflex (LCx): Normal  Right coronary artery (RCA): Normal  Aortic Root: Moderate to severe peri valvular regurgitation No aortic root dilatation.   Final Conclusions: AS/AR  Recommendations: Will discuss further with Dr Cornelius Moras and Excell Seltzer. By TEE the struts are not crimped and the leaflets look nice with planimetry area of 1.6cm2  High gradients (mean and peak 102 mmHg) by echo may be related to small valve with significant regurgitation causing high CO. Do not think valve in valve TAVR indicated with leaflets looking so good. Consider catheter based sealing of peri prosthetic regurgitation.   TEE 09/19/13 TEE: 7mg  versed and 25 ug of fentanyl  EF 40-45% diffuse hypokinesis Mild MR  Normal TV/PV  Tissue AVR with moderate perivalvular leak (11:00-1:00) Leaflet motion and mobility look fine. Mean gradient  Planimetry valve area 1.6 cm2  No LAA thrombus  No ASD/PFO  Normal Aorta   Discharge Medications     Medication List         amoxicillin 500 MG tablet  Commonly known as:  AMOXIL  Take 2,000 mg by mouth once as needed (takes prior to dental surgeries).     aspirin EC 81 MG tablet  Take 81 mg by mouth daily.     FISH OIL PO  Take 750 mg by mouth daily.     lisinopril 5 MG tablet  Commonly known as:  PRINIVIL,ZESTRIL  Take 1 tablet (5 mg total) by mouth daily.        Disposition   The patient will be discharged in stable condition  to home. Discharge Orders   Future Appointments Provider Department Dept Phone   09/23/2013 11:00 AM Purcell Nails, MD [facility] [phone]   Future Orders Complete By Expires   Diet - low sodium heart healthy  As directed    Increase activity slowly  As directed    Comments:     No driving for 2 days. No lifting over 5 lbs for 1 week. No sexual activity for 1 week. You may return to work if you are feeling well with the above restrictions. Keep procedure site clean & dry. If you notice increased pain, swelling, bleeding or pus, call/return! If you have any concerns about your cath site, do not hesitate to call your doctor. You may  shower, but no soaking baths/hot tubs/pools for 1 week.     Follow-up Information   Follow up with Purcell Nails, MD. (09/23/13 at 11am)    Specialty:  Cardiothoracic Surgery   Contact information:   [address] [place] [address] [phone]       Follow up with [name], MD. (Our office will call you for a follow-up appointment. Please call the office if you have not heard from [place] within 1 week.)    Specialty:  Cardiology   Contact information:   [address] [place] [address] [phone]         Duration of Discharge Encounter: Greater than 30 minutes including physician and PA time.  Signed, Ronie Spies PA-C 09/20/2013, 9:13 AM

## 2013-09-20 NOTE — Progress Notes (Signed)
Utilization Review Completed.[name] T9/26/2014  

## 2013-09-23 ENCOUNTER — Institutional Professional Consult (permissible substitution) (INDEPENDENT_AMBULATORY_CARE_PROVIDER_SITE_OTHER): Payer: BC Managed Care – PPO | Admitting: Thoracic Surgery (Cardiothoracic Vascular Surgery)

## 2013-09-23 ENCOUNTER — Encounter: Payer: Self-pay | Admitting: Thoracic Surgery (Cardiothoracic Vascular Surgery)

## 2013-09-23 VITALS — BP 138/86 | HR 68 | Resp 16 | Ht 72.0 in | Wt 185.0 lb

## 2013-09-23 DIAGNOSIS — Z5189 Encounter for other specified aftercare: Secondary | ICD-10-CM

## 2013-09-23 DIAGNOSIS — T8203XD Leakage of heart valve prosthesis, subsequent encounter: Secondary | ICD-10-CM

## 2013-09-23 DIAGNOSIS — Z952 Presence of prosthetic heart valve: Secondary | ICD-10-CM

## 2013-09-23 DIAGNOSIS — I351 Nonrheumatic aortic (valve) insufficiency: Secondary | ICD-10-CM | POA: Insufficient documentation

## 2013-09-23 DIAGNOSIS — T8203XA Leakage of heart valve prosthesis, initial encounter: Secondary | ICD-10-CM | POA: Insufficient documentation

## 2013-09-23 DIAGNOSIS — Z953 Presence of xenogenic heart valve: Secondary | ICD-10-CM

## 2013-09-23 DIAGNOSIS — I359 Nonrheumatic aortic valve disorder, unspecified: Secondary | ICD-10-CM

## 2013-09-23 DIAGNOSIS — I350 Nonrheumatic aortic (valve) stenosis: Secondary | ICD-10-CM | POA: Insufficient documentation

## 2013-09-23 NOTE — H&P (Addendum)
[address]       [name] [address]             [phone]     CARDIOTHORACIC SURGERY CONSULTATION REPORT  Referring Provider is Wendall Stade, MD PCP is Benita Stabile, MD  Chief Complaint  Patient presents with  . Shortness of Breath    aortic stenosis/regurg...h/o AVR 2010,,ECHO 09/11/13.Marland KitchenMarland KitchenCATH 09/18/13    HPI:  Patient is a 57 year old gentleman with history of bicuspid aortic valve disease who underwent aortic valve replacement using a bioprosthetic tissue valve via right mini thoracotomy approach December 2010. The patient's valve was replaced using a 21 mm [facility] Ease bovine pericardial tissue valve.  Postoperatively the patient was noted to have a mild paravalvular leak and transvalvular gradients across the aortic valve prosthesis have been somewhat elevated.  Peak velocity across the aortic valve has varied between 3.7 and 4.1 m/sec on transthoracic echocardiograms for the first several years, including the first echocardiogram performed January 2011 at which time it measured 3.7 m/sec.  At that time left ventricular systolic function appeared normal with ejection fraction estimated 55%. Since then in followup transthoracic echocardiograms have suggested some decrease in left ventricular function with ejection fraction estimated 35-40% by echocardiogram in September 2013.  Followup echocardiogram performed earlier this month revealed what appeared to be a significant increase in the transvalvular gradient with peak velocity greater than 5 m/sec corresponding to estimated mean transvalvular gradient of 60 mmHg.  Left ventricular ejection fraction was felt to have actually increased to 45%. There remained what appeared to be a mild paravalvular leak. The patient was subsequently brought in for transesophageal echocardiogram and cardiac catheterization last week by Dr. Eden Emms. Transesophageal echocardiogram revealed normal functioning bioprosthetic  tissue valve with normal leaflet mobility and no sign of structural valve deterioration whatsoever.  The mean transvalvular gradient was estimated 37 mm mercury. There remained a "moderate" paravalvular leak.  Left ventricular ejection fraction was estimated 40-45%. By catheterization the transvalvular gradient measured 28 mm mercury. Pulmonary artery pressures were normal and cardiac output measured 4.3 L per minute corresponding to a cardiac index of 2.1.  Cardiothoracic surgical consultation was requested.  The patient reports what he describes as a mild decrease in his peak exercise tolerance. He remains quite active physically. He has no shortness of breath with normal physical activity. He only gets short of breath if he really pushes himself strenuously.  He has noticed that his heart rate seems to rise with exercise more quickly than he had remembered it in the past. He does note that his exercise tolerance and breathing remains considerably better than it was prior to his aortic valve replacement in 2010.   Past Medical History  Diagnosis Date  . Rosacea   . Reflux   . Hearing loss     bilateral, pt wears hearing aids   . Aortic valve disease     a. Biscuspid AVR with heavy annular calcification s/p tissue AVR (pt preferred). b. Mod-severe perivalvular regurg by cath 08/2013, mod by TEE 08/2013, gradients lower by TEE than by echo.   . Nonischemic cardiomyopathy     a. EF normal immediately after AVR, then decreased to 40-45% by echo & 34% by MRI 2011. b. Most recent EF 40-45% by echo/TEE 08/2013.  Marland Kitchen HTN (hypertension)   . Paravalvular leak of prosthetic heart valve   . Aortic insufficiency   . S/P aortic valve replacement with bioprosthetic valve 12/08/2009  21mm [facility] Ease bovine pericardial tissue valve placed via right mini thoracotomy approach    Past Surgical History  Procedure Laterality Date  . Aortic valve replacement  12/08/2009    Procedure: Right miniature  anterior thoracotomy for aortic valve replacement (32-mm [facility] Ease pericardial tissue valve) Surgeon: [name], MD assistant: Kerin Perna, MD  . Inguinal hernia repair      Left  . Inguinal hernia repair  1960    right side  . Tooth extraction Left August 2014    left bottom molar  . Tee without cardioversion N/A 09/19/2013    Procedure: TRANSESOPHAGEAL ECHOCARDIOGRAM (TEE);  Surgeon: Wendall Stade, MD;  Location: [facility];  Service: Cardiovascular;  Laterality: N/A;    History reviewed. No pertinent family history.  History   Social History  . Marital Status: Married    Spouse Name: N/A    Number of Children: 2  . Years of Education: N/A   Occupational History  . Business Engineer, manufacturing systems     Social History Main Topics  . Smoking status: Never Smoker   . Smokeless tobacco: Never Used  . Alcohol Use: 0.6 oz/week    1 Cans of beer per week     Comment: occasional  . Drug Use: No  . Sexual Activity: Not on file   Other Topics Concern  . Not on file   Social History Narrative   Exercises 4 days per week    Current Outpatient Prescriptions  Medication Sig Dispense Refill  . amoxicillin (AMOXIL) 500 MG tablet Take 2,000 mg by mouth once as needed (takes prior to dental surgeries).       Marland Kitchen aspirin EC 81 MG tablet Take 81 mg by mouth daily.      Marland Kitchen lisinopril (PRINIVIL,ZESTRIL) 5 MG tablet Take 1 tablet (5 mg total) by mouth daily.  90 tablet  3  . Omega-3 Fatty Acids (FISH OIL PO) Take 750 mg by mouth daily.       No current facility-administered medications for this visit.    No Known Allergies    Review of Systems:   General:  normal appetite, normal energy, no weight gain, no weight loss, no fever  Cardiac:  no chest pain with exertion, no chest pain at rest, SOB with strenuous exertion, no resting SOB, no PND, no orthopnea, occasional palpitations, no arrhythmia, no atrial fibrillation, no LE edema, no dizzy spells,  no syncope  Respiratory:  no shortness of breath, no home oxygen, no productive cough, no dry cough, no bronchitis, no wheezing, no hemoptysis, no asthma, no pain with inspiration or cough, no sleep apnea, no CPAP at night  GI:   no difficulty swallowing, no reflux, no frequent heartburn, no hiatal hernia, no abdominal pain, no constipation, no diarrhea, no hematochezia, no hematemesis, no melena  GU:   no dysuria,  no frequency, no urinary tract infection, no hematuria, no enlarged prostate, no kidney stones, no kidney disease  Vascular:  no pain suggestive of claudication, no pain in feet, no leg cramps, no varicose veins, no DVT, no non-healing foot ulcer  Neuro:   no stroke, no TIA's, no seizures, no headaches, no temporary blindness one eye,  no slurred speech, no peripheral neuropathy, no chronic pain, no instability of gait, no memory/cognitive dysfunction  Musculoskeletal: no arthritis, no joint swelling, no myalgias, no difficulty walking, normal mobility   Skin:   no rash, no itching, no skin infections, no pressure sores or ulcerations  Psych:   no anxiety, no depression, no nervousness, no unusual recent stress  Eyes:   no blurry vision, no floaters, no recent vision changes, + wears glasses or contacts  ENT:   + hearing loss, no loose or painful teeth, no dentures, last saw dentist within the past year  Hematologic:  no easy bruising, no abnormal bleeding, no clotting disorder, no frequent epistaxis  Endocrine:  no diabetes, does not check CBG's at home     Physical Exam:   BP 138/86  Pulse 68  Resp 16  Ht 6' (1.829 m)  Wt 185 lb (83.915 kg)  BMI 25.08 kg/m2  SpO2 98%  General:    well-appearing  HEENT:  Unremarkable   Neck:   no JVD, no bruits, no adenopathy   Chest:   clear to auscultation, symmetrical breath sounds, no wheezes, no rhonchi   CV:   RRR, grade III/VI systolic murmur   Abdomen:  soft, non-tender, no masses   Extremities:  warm, well-perfused, pulses  palpable, no LE edema  Rectal/GU  Deferred  Neuro:   Grossly non-focal and symmetrical throughout  Skin:   Clean and dry, no rashes, no breakdown   Diagnostic Tests:    Transthoracic Echocardiography   Patient:    Harnoor, Kohles  MR #:       [id number]  Study Date: 01/06/2010  Gender:     M  Age:        54  Height:     182.9cm  Weight:     84.4kg  BSA:        2.33m^2  Pt. Status:  Room:    ADMITTING    [name], MD, [facility]   ATTENDING    [name], MD, [facility]   ORDERING     [name], MD, [facility]   SONOGRAPHER  [facility]   PERFORMING   [facility], [facility]  cc:   --------------------------------------------------------------------  Indications:   Aortic insufficiency 424.1.   --------------------------------------------------------------------  History:  PMH: c/o occasional palpitations. Patient is one month s/p  AVR using minimally invasive procedure. He had a bicuspid AV with  severe AS.Post surgery, a perivalvular leak was seen by TEE.   --------------------------------------------------------------------  Study Conclusions   - Left ventricle: septal hypokinesis The cavity size was mildly    dilated. Wall thickness was increased in a pattern of mild LVH.    Systolic function was normal. The estimated ejection fraction was    55%, in the range of 60% to 65%. Wall motion was normal; there    were no regional wall motion abnormalities. Left ventricular    diastolic function parameters were normal.  - Aortic valve: Tissue AVR not well visualized. Mild periprosthetic    leak and somewhat elevated gradients at baseline Mild    regurgitation. Valve area: 0.52cm^2(VTI). Valve area: 0.58cm^2    (Vmax).  - Mitral valve: Calcified annulus. Mildly thickened leaflets .  - Left atrium: The atrium was mildly dilated.  - Atrial septum: No defect or patent foramen ovale was identified.  Transthoracic echocardiography. M-mode, complete 2D, spectral  Doppler, and color  Doppler. Height: Height: 182.9cm. Height: 72in.  Weight: Weight: 84.4kg. Weight: 185.6lb. Body mass index: BMI:  25.2kg/m^2. Body surface area: BSA: 2.32m^2. Blood pressure: 120/70.  Patient status: Outpatient. Location: [place] [facility]   --------------------------------------------------------------------   --------------------------------------------------------------------  Left ventricle: septal hypokinesis The cavity size was mildly  dilated. Wall thickness was increased in a pattern of mild LVH.  Systolic  function was normal. The estimated ejection fraction was  55%, in the range of 60% to 65%. Wall motion was normal; there were  no regional wall motion abnormalities. The transmitral flow pattern  was normal. The deceleration time of the early transmitral flow  velocity was normal. The pulmonary vein flow pattern was normal. The  tissue Doppler parameters were normal. Left ventricular diastolic  function parameters were normal.   --------------------------------------------------------------------  Aortic valve: Tissue AVR not well visualized. Mild periprosthetic  leak and somewhat elevated gradients at baseline Poorly visualized.  Doppler: Transvalvular velocity was minimally increased. Mild  regurgitation.  Valve area: 0.52cm^2(VTI). Indexed valve area:  0.25cm^2/m^2 (VTI). Valve area: 0.58cm^2 (Vmax). Indexed valve area:  0.28cm^2/m^2 (Vmax).  Mean gradient: 29mm Hg (S). Peak gradient:  56mm Hg (S).   --------------------------------------------------------------------  Mitral valve: Calcified annulus. Mildly thickened leaflets .  Doppler: Trivial regurgitation.  Peak gradient: 4mm Hg (D).   --------------------------------------------------------------------  Left atrium: The atrium was mildly dilated.   --------------------------------------------------------------------  Atrial septum: No defect or patent foramen ovale was identified.    --------------------------------------------------------------------  Right ventricle: The cavity size was normal. Wall thickness was  normal. Systolic function was normal.   --------------------------------------------------------------------  Pulmonic valve:  Doppler: Trivial regurgitation.   --------------------------------------------------------------------  Tricuspid valve:  Doppler: Mild regurgitation.   --------------------------------------------------------------------  Right atrium: The atrium was normal in size.   --------------------------------------------------------------------  Pericardium: The pericardium was normal in appearance.   --------------------------------------------------------------------  Systemic veins:  Inferior vena cava: The vessel was normal in size; the respirophasic  diameter changes were in the normal range (= 50%); findings are  consistent with normal central venous pressure.   --------------------------------------------------------------------   2D measurements            Normal  Doppler measurements      Normal  Left ventricle                     Aortic valve  LVID ED,         52 mm     43-52   Peak vel, S  373 cm/s     ------  chord, PLAX                        Mean vel, S  243 cm/s     ------  LVID ES,         30 mm     23-38   VTI, S       80. cm       ------  chord, PLAX                                       9  FS, chord,       42 %      >29     Mean          29 mm Hg    ------  PLAX                               gradient, S  LVPW, ED         13 mm     ------  Peak          56 mm Hg    ------  IVS/LVPW       0.92        <  1.3    gradient, S  ratio, ED                          Area, VTI    0.5 cm^2     ------  Vol ED, MOD1    136 ml     ------                 2  Vol ES, MOD1     47 ml     ------  Area index   0.2 cm^2/m^2 ------  EF, MOD1         65 %      ------  (VTI)          5  Vol index, ED,   66 ml/m^2 ------  Area, Vmax   0.5  cm^2     ------  MOD1                                              8  Vol index, ES,   23 ml/m^2 ------  Area index   0.2 cm^2/m^2 ------  MOD1                               (Vmax)         8  Vol ED, MOD2    106 ml     ------  Regurg PHT   539 ms       ------  Vol ES, MOD2     35 ml     ------  Mitral valve  EF, MOD2         67 %      ------  Peak E vel   102 cm/s     ------  Stroke vol,      71 ml     ------  Peak A vel   83. cm/s     ------  MOD2                                              4  Vol index, ED,   51 ml/m^2 ------  Deceleration 176 ms       150-23  MOD2                               time                      0  Vol index, ES,   17 ml/m^2 ------  Peak           4 mm Hg    ------  MOD2                               gradient, D  Stroke index,  34.3 ml/m^2 ------  Peak E/A     1.2          ------  MOD2  ratio  Ventricular septum                 Tricuspid valve  IVS, ED          12 mm     ------  Regurg peak  240 cm/s     ------  Aorta                              vel  Root diam, ED    27 mm     ------  Peak RV-RA    23 mm Hg    ------  Left atrium                        gradient, S  AP dim           35 mm     ------  AP dim index   1.69 cm/m^2 <2.2  Right ventricle  RVID ED, PLAX    24 mm     19-38   --------------------------------------------------------------------  Prepared and Electronically Authenticated by   [name], MD, [facility]  2011-01-12T18:43:[id number]    Transthoracic Echocardiography  Patient:    Shadoe, Bethel MR #:       [id number] Study Date: 08/22/2011 Gender:     M Age:        55 Height:     182.9cm Weight:     85.7kg BSA:        2.46m^2 Pt. Status: Room:    ATTENDING    Olga Millers, MD, [facility]  ORDERING     Olga Millers, MD, [facility]  REFERRING    Olga Millers, MD, [facility]  PERFORMING   [facility], [facility]  SONOGRAPHER  Philomena Course, RDCS cc: Dr Lupe Carney  -------------------------------------------------------------------- LV EF: 40% -   45%  -------------------------------------------------------------------- Indications:   Prosthetic valve - aortic V43.3.  -------------------------------------------------------------------- History:  PMH: Bicuspid aortic valve. Palpitations. Aortic valve disease, post prosthetic replacement. Moderate aortic regurgitation.  -------------------------------------------------------------------- Study Conclusions  - Left ventricle: Septal and apical hypokinesis The cavity size was   moderately dilated. Systolic function was mildly to moderately   reduced. The estimated ejection fraction was in the range of 40%   to 45%. - Aortic valve: Tissue AVR with high gradeints and mild AR - Mitral valve: Mild regurgitation. - Left atrium: The atrium was moderately dilated. - Atrial septum: No defect or patent foramen ovale was identified. Transthoracic echocardiography. M-mode, complete 2D, spectral Doppler, and color Doppler. Height: Height: 182.9cm. Height: 72in. Weight: Weight: 85.7kg. Weight: 188.6lb. Body mass index: BMI: 25.6kg/m^2. Body surface area: BSA: 2.100m^2. Blood pressure: 115/69. Patient status: Outpatient. Location: [place] [facility]  --------------------------------------------------------------------  -------------------------------------------------------------------- Left ventricle: Septal and apical hypokinesis The cavity size was moderately dilated. Systolic function was mildly to moderately reduced. The estimated ejection fraction was in the range of 40% to 45%.  -------------------------------------------------------------------- Aortic valve: Tissue AVR with high gradeints and mild AR Doppler: VTI ratio of LVOT to aortic valve: 0.24. Indexed valve area: 0.36cm^2/m^2 (VTI). Peak velocity ratio of LVOT to aortic valve: 0.25. Indexed valve area: 0.37cm^2/m^2 (Vmax).  Mean  gradient: 44mm Hg (S). Peak gradient: 72mm Hg (S).  -------------------------------------------------------------------- Aorta: The aorta was normal, not dilated, and non-diseased.  -------------------------------------------------------------------- Mitral valve:  Doppler: Mild regurgitation.  Peak gradient: 2mm Hg (D).  -------------------------------------------------------------------- Left atrium: The atrium was moderately dilated.  -------------------------------------------------------------------- Atrial septum: No defect or patent  foramen ovale was identified.  -------------------------------------------------------------------- Right ventricle: The cavity size was normal. Wall thickness was normal. Systolic function was normal.  -------------------------------------------------------------------- Pulmonic valve: The valve appears to be grossly normal.  -------------------------------------------------------------------- Tricuspid valve:  Doppler: Mild regurgitation.  -------------------------------------------------------------------- Right atrium: The atrium was normal in size.  -------------------------------------------------------------------- Pericardium: The pericardium was normal in appearance.  -------------------------------------------------------------------- Post procedure conclusions Ascending Aorta:  - The aorta was normal, not dilated, and non-diseased.  --------------------------------------------------------------------  2D measurements            Normal  Doppler measurements       Normal Left ventricle                     Main pulmonary artery LVID ED,         52 mm     43-52   Pressure,     28 mm Hg     =30 chord, PLAX                        S LVID ES,         39 mm     23-38   Left ventricle chord, PLAX                        Ea, lat      7.5 cm/s      ------ FS, chord,       25 %      >29     ann, tiss PLAX                                DP LVPW, ED         11 mm     ------  E/Ea, lat  10.33           ------ IVS/LVPW       1.09        <1.3    ann, tiss ratio, ED                          DP Ventricular septum                 Ea, med      7.7 cm/s      ------ IVS, ED          12 mm     ------  ann, tiss LVOT                               DP Diam, S          20 mm     ------  E/Ea, med  10.06           ------ Area           3.14 cm^2   ------  ann, tiss Diam             20 mm     ------  DP Aorta                              LVOT Root diam, ED    30 mm     ------  Peak vel,    104 cm/s      ------  Left atrium                        S AP dim           51 mm     ------  VTI, S      23.2 cm        ------ AP dim index   2.45 cm/m^2 <2.2    HR            70 bpm       ------                                    Stroke vol  72.9 ml        ------                                    Cardiac      5.1 L/min     ------                                    output                                    Cardiac      2.5 L/(min-m^ ------                                    index            2)                                    Stroke        35 ml/m^2    ------                                    index                                    Aortic valve                                    Peak vel,    423 cm/s      ------                                    S                                    Mean vel,    312 cm/s      ------  S                                    VTI, S      97.5 cm        ------                                    Mean          44 mm Hg     ------                                    gradient,                                    S                                    Peak          72 mm Hg     ------                                    gradient,                                    S                                    VTI ratio   0.24           ------                                    LVOT/AV                                     Area index  0.36 cm^2/m^2  ------                                    (VTI)                                    Peak vel    0.25           ------                                    ratio,                                    LVOT/AV  Area index  0.37 cm^2/m^2  ------                                    (Vmax)                                    Regurg PHT   426 ms        ------                                    Mitral valve                                    Peak E vel  77.5 cm/s      ------                                    Peak A vel  68.6 cm/s      ------                                    Decelerati   211 ms        150-23                                    on time                    0                                    Peak           2 mm Hg     ------                                    gradient,                                    D                                    Peak E/A     1.1           ------                                    ratio                                    Tricuspid valve  Regurg       240 cm/s      ------                                    peak vel                                    Peak RV-RA    23 mm Hg     ------                                    gradient,                                    S                                    Systemic veins                                    Estimated      5 mm Hg     ------                                    CVP                                    Right ventricle                                    Pressure,     28 mm Hg     <30                                    S                                    Sa vel,       16 cm/s      ------                                    lat ann,                                    tiss DP                                    Pulmonic valve  Peak vel,    108 cm/s      ------                                     S   -------------------------------------------------------------------- Prepared and Electronically Authenticated by  [name] 2012-08-27T15:40:41.277   Transthoracic Echocardiography  (Report amended )  Patient:    Erich, Kochan MR #:       [id number] Study Date: 08/30/2012 Gender:     M Age:        56 Height:     182.9cm Weight:     83.5kg BSA:        2.26m^2 Pt. Status: Room:    ORDERING     Gweneth Dimitri  ATTENDING    Patsi Sears  SONOGRAPHER  Aida Raider  PERFORMING   [facility] cc:  ------------------------------------------------------------ LV EF: 35% -   40%  ------------------------------------------------------------ Indications:      424.1 Aortic valve disorders.  ------------------------------------------------------------ History:   PMH:  Acquired from the patient and from the patient's chart.  PMH:  History of Bicuspid Aortic Valve. CHF. Dilated Cardiomyopathy. Palpitations.  ------------------------------------------------------------ Study Conclusions  - Left ventricle: Septal and apical hypokinesis The cavity   size was severely dilated. Wall thickness was normal.   Systolic function was moderately reduced. The estimated   ejection fraction was in the range of 35% to 40%. - Aortic valve: Tissue AVR. Increased systolic gradients.   Mild periprosthetic regurgitation. Mean gradient: 45mm Hg   (S). Peak gradient: 67mm Hg (S). - Mitral valve: Mild regurgitation. - Left atrium: The atrium was mildly dilated. - Atrial septum: No defect or patent foramen ovale was   identified. - Pulmonary arteries: PA peak pressure: 37mm Hg (S).  ------------------------------------------------------------ Labs, prior tests, procedures, and surgery: Status post Aortic Valve Replacement.          Transthoracic echocardiography.  M-mode, complete  2D, spectral Doppler, and color Doppler.  Height:  Height: 182.9cm. Height: 72in. Weight:  Weight: 83.5kg. Weight: 183.6lb.  Body mass index: BMI: 25kg/m^2.  Body surface area:    BSA: 2.71m^2.  Blood pressure:     119/78.  Patient status:  Outpatient. Location:  [facility] [place]  ------------------------------------------------------------  ------------------------------------------------------------ Left ventricle:  Septal and apical hypokinesis The cavity size was severely dilated. Wall thickness was normal. Systolic function was moderately reduced. The estimated ejection fraction was in the range of 35% to 40%.  ------------------------------------------------------------ Aortic valve:  Tissue AVR. Increased systolic gradients. Mild periprosthetic regurgitation.  Doppler:     VTI ratio of LVOT to aortic valve: 0.23. Indexed valve area: 0.35cm^2/m^2 (VTI). Peak velocity ratio of LVOT to aortic valve: 0.23. Indexed valve area: 0.35cm^2/m^2 (Vmax). Mean gradient: 45mm Hg (S). Peak gradient: 67mm Hg (S).  ------------------------------------------------------------ Aorta:  The aorta was normal, not dilated, and non-diseased.   ------------------------------------------------------------ Mitral valve:   Doppler:   Mild regurgitation.    Peak gradient: 3mm Hg (D).  ------------------------------------------------------------ Left atrium:  The atrium was mildly dilated.  ------------------------------------------------------------ Atrial septum:  No defect or patent foramen ovale was identified.  ------------------------------------------------------------ Right ventricle:  The cavity size was normal. Wall thickness was normal. Systolic function was normal.  ------------------------------------------------------------ Pulmonic valve:    Doppler:   Trivial regurgitation.  ------------------------------------------------------------ Tricuspid valve:   Doppler:   Mild  regurgitation.  ------------------------------------------------------------ Right atrium:  The atrium was normal in size.  ------------------------------------------------------------ Pericardium:  The pericardium was normal in appearance.   ------------------------------------------------------------ Post procedure conclusions Ascending Aorta:  - The aorta was normal, not dilated, and non-diseased.  ------------------------------------------------------------  2D measurements        Normal  Doppler measurements   Normal Left ventricle                 Main pulmonary LVID ED,   54.1 mm     43-52   artery chord,                         Pressure,    37 mm Hg  =30 PLAX                           S LVID ES,   37.8 mm     23-38   Left ventricle chord,                         Ea, lat    10.7 cm/s   ------ PLAX                           ann, tiss FS, chord,   30 %      >29     DP PLAX                           E/Ea, lat  8.18        ------ LVPW, ED   10.9 mm     ------  ann, tiss IVS/LVPW   0.79        <1.3    DP ratio, ED                      Ea, med    8.65 cm/s   ------ Ventricular septum             ann, tiss IVS, ED    8.66 mm     ------  DP LVOT                           E/Ea, med  10.1        ------ Diam, S      20 mm     ------  ann, tiss     2 Area       3.14 cm^2   ------  DP Diam         20 mm     ------  LVOT Aorta                          Peak vel,    93 cm/s   ------ Root diam,   30 mm     ------  S ED                             VTI, S     21.6 cm     ------ Left atrium                    Stroke vol 67.9 ml     ------ AP dim  56 mm     ------  Stroke     32.9 ml/m^2 ------ AP dim     2.72 cm/m^2 <2.2    index index                          Aortic valve                                Peak vel,   410 cm/s   ------                                S                                Mean vel,   324 cm/s   ------                                S                                 VTI, S     94.9 cm     ------                                Mean         45 mm Hg  ------                                gradient,                                S                                Peak         67 mm Hg  ------                                gradient,                                S                                VTI ratio  0.23        ------                                LVOT/AV                                Area index 0.35 cm^2/m ------                                (  VTI)           ^2                                Peak vel   0.23        ------                                ratio,                                LVOT/AV                                Area index 0.35 cm^2/m ------                                (Vmax)          ^2                                Regurg PHT  637 ms     ------                                Mitral valve                                Peak E vel 87.5 cm/s   ------                                Peak A vel 50.2 cm/s   ------                                Decelerati  278 ms     150-23                                on time                0                                Peak          3 mm Hg  ------                                gradient,                                D                                Peak E/A    1.7        ------  ratio                                Tricuspid valve                                Regurg      285 cm/s   ------                                peak vel                                Peak RV-RA   32 mm Hg  ------                                gradient,                                S                                Max regurg  285 cm/s   ------                                vel                                Systemic veins                                Estimated     5 mm Hg  ------                                CVP                                Right  ventricle                                Pressure,    37 mm Hg  <30                                S                                Sa vel,    16.1 cm/s   ------                                lat ann,  tiss DP   ------------------------------------------------------------ Godfrey Pick, Peter 2013-09-05T11:41:[id number]    Transthoracic Echocardiography  Patient:    Courtenay, Creger MR #:       [id number] Study Date: 09/11/2013 Gender:     M Age:        57 Height:     182.9cm Weight:     84.8kg BSA:        2.52m^2 Pt. Status: Room:    ORDERING     Gweneth Dimitri  ATTENDING    Willa Rough  PERFORMING   [facility], [facility]  SONOGRAPHER  Junious Dresser, RDCS cc:  ------------------------------------------------------------ LV EF: 40% -   45%  ------------------------------------------------------------ Indications:      424.1 Aortic valve disorders.  Biscuspid aortic valve 746.4.  Cardiomyopathy - dilated 425.4.  ------------------------------------------------------------ History:   PMH:  Acquired from the patient and from the patient's chart.  Systolic murmur.  Bicuspid aortic valve. Dilated cardiomyopathy, with congestive heart failure, with an ejection fraction of 40%by echocardiography. The dysfunction is primarily systolic.  Aortic valve disease, post prosthetic replacement.  ------------------------------------------------------------ Study Conclusions  - Left ventricle: Septal and apical hypokinesis The cavity   size was moderately dilated. Wall thickness was normal.   Systolic function was mildly to moderately reduced. The   estimated ejection fraction was in the range of 40% to   45%. - Aortic valve: Tissue AVR with mild peri valvular   regurgitation stable Gradient have increased since last   year consistant with severe stenosis. Leaflet motion did   not appear that bad. Suggest cath and TEE  evaluation Mean   gradient: 60mm Hg (S). Peak gradient: Hg (S). - Mitral valve: Calcified annulus. Mild regurgitation. - Left atrium: The atrium was mildly dilated. - Atrial septum: No defect or patent foramen ovale was   identified.  ------------------------------------------------------------ Labs, prior tests, procedures, and surgery: Echocardiography (September 2013).    The aortic valve showed mild regurgitation.  EF was 40% and PA pressure was 37 (systolic). Aortic valve: peak gradient of 67mm Hg and mean gradient of 45mm Hg.  Valve surgery.     Aortic valve replacement with a bovine bioprosthetic valve. Transthoracic echocardiography.  M-mode, complete 2D, spectral Doppler, and color Doppler.  Height:  Height: 182.9cm. Height: 72in.  Weight:  Weight: 84.8kg. Weight: 186.6lb.  Body mass index:  BMI: 25.4kg/m^2.  Body surface area:    BSA: 2.59m^2.  Blood pressure:     118/76.  Patient status:  Outpatient.  Location:  [facility]  ------------------------------------------------------------  ------------------------------------------------------------ Left ventricle:  Septal and apical hypokinesis The cavity size was moderately dilated. Wall thickness was normal. Systolic function was mildly to moderately reduced. The estimated ejection fraction was in the range of 40% to 45%.   ------------------------------------------------------------ Aortic valve:  Tissue AVR with mild peri valvular regurgitation stable Gradient have increased since last year consistant with severe stenosis. Leaflet motion did not appear that bad. Suggest cath and TEE evaluation  Doppler:   VTI ratio of LVOT to aortic valve: 0.23. Peak velocity ratio of LVOT to aortic valve: 0.22.    Mean gradient: 60mm Hg (S). Peak gradient: Hg (S).  ------------------------------------------------------------ Mitral valve:   Calcified annulus.  Doppler:    Mild regurgitation.  ------------------------------------------------------------ Left atrium:  The atrium was mildly dilated.  ------------------------------------------------------------ Atrial septum:  No defect or patent foramen ovale was identified.  ------------------------------------------------------------ Right ventricle:  The cavity size was normal. Wall thickness  was normal. Systolic function was normal.  ------------------------------------------------------------ Pulmonic valve:    Doppler:   Mild regurgitation.  ------------------------------------------------------------ Tricuspid valve:   Doppler:   Mild regurgitation.  ------------------------------------------------------------ Right atrium:  The atrium was normal in size.  ------------------------------------------------------------ Pericardium:  The pericardium was normal in appearance.  ------------------------------------------------------------  2D measurements        Normal  Doppler measurements   Normal Left ventricle                 Main pulmonary LVID ED,   50.4 mm     43-52   artery chord,                         Pressure, S    28 mm   =30 PLAX                                             Hg LVID ES,   40.5 mm     23-38   Left ventricle chord,                         Ea, lat ann,  10. cm/s ------ PLAX                           tiss DP         7 FS, chord,   20 %      >29     E/Ea, lat     6.2      ------ PLAX                           ann, tiss DP    7 LVPW, ED   10.3 mm     ------  Ea, med ann,  6.8 cm/s ------ IVS/LVPW   0.75        <1.3    tiss DP ratio, ED                      E/Ea, med     9.8      ------ Ventricular septum             ann, tiss DP    7 IVS, ED    7.76 mm     ------  LVOT Aorta                          Peak vel, S   113 cm/s ------ Root diam,   29 mm     ------  VTI, S        24. cm   ------ ED                                             8 AAo AP       31 mm     ------   Peak            5 mm   ------ diam, S  gradient, S       Hg Left atrium                    Aortic valve AP dim       43 mm     ------  Peak vel, S   504 cm/s ------ AP dim     2.08 cm/m^2 <2.2    Mean vel, S   367 cm/s ------ index                          VTI, S        109 cm   ------                                Mean           60 mm   ------                                gradient, S       Hg                                Peak          102 mm   ------                                gradient, S       Hg                                VTI ratio     0.2      ------                                LVOT/AV         3                                Peak vel      0.2      ------                                ratio,          2                                LVOT/AV                                Regurg PHT    354 ms   ------                                Mitral valve                                Peak E  vel    67. cm/s ------                                                1                                Peak A vel    75. cm/s ------                                                5                                Deceleration  292 ms   150-23                                time                   0                                Peak E/A      0.9      ------                                ratio                                Tricuspid valve                                Regurg peak   239 cm/s ------                                vel                                Peak RV-RA     23 mm   ------                                gradient, S       Hg                                Systemic veins                                Estimated CVP   5 mm   ------  Hg                                Right ventricle                                Pressure, S    28 mm   <30                                                  Hg                                 Sa vel, lat   16. cm/s ------                                ann, tiss DP    6   ------------------------------------------------------------ Prepared and Electronically Authenticated by  [name] 2014-09-17T14:14:16.900    Transesophageal Echocardiography  TEE:  7mg  versed and 25 ug of fentanyl  EF 40-45% diffuse hypokinesis Mild MR Normal TV/PV Tissue AVR with moderate perivalvular leak (11:00-1:00)  Leaflet motion and mobility look fine.  Mean gradient Planimetry valve area 1.6 cm2 No LAA thrombus No ASD/PFO Normal Aorta    Tolerated procedure well  [name]        Cardiac Catheterization Procedure Note  Name: VAIBHAV FOGLEMAN MRN: [id number] DOB: Nov 23, 1956  Procedure: Right Heart Cath, Left Heart Cath, Selective Coronary Angiography, LV angiography  Indication:  Aortic stenosis/regurgitation post tissue AVR with 21mm Magna Ease bovine valve                           Procedural Details: The right groin was prepped, draped, and anesthetized with 1% lidocaine. Using the modified Seldinger technique a 5 French sheath was placed in the right femoral artery and a 7 French sheath was placed in the right femoral vein. A Swan-Ganz catheter was used for the right heart catheterization. Standard protocol was followed for recording of right heart pressures and sampling of oxygen saturations. Fick cardiac output was calculated. Standard Judkins catheters were used for selective coronary angiography and left ventriculography. There were no immediate procedural complications. The patient was transferred to the post catheterization recovery area for further monitoring.  Procedural Findings: Hemodynamics RA 8 RV 30/4 PA 22/12 PCWP  14 LV 133/10 AO 105/63  Oxygen saturations: PA 68% AO 98%  Cardiac Output (Fick) 4.33  Cardiac Index (Fick)  2.1    AV peak to peak gradient 28 mmHg mean gradient 28.67mm Hg  Calculated AVA .93 mmHg    Coronary angiography: Coronary dominance: right  Left mainstem: Normal  Left anterior descending (LAD):  Normal  Left circumflex (LCx):  Normal  Right coronary artery (RCA):  Normal  Aortic Root:  Moderate to severe peri valvular regurgitation No aortic root dilatation.    Final Conclusions:  AS/AR Recommendations:  Will discuss further with Dr Cornelius Moras and Excell Seltzer.  By TEE the struts are not crimped and the leaflets look nice with planimetry area of 1.6cm2 High gradients (mean  and peak 102 mmHg) by echo may  be related to small valve with significant regurgitation causing high CO.  Do not think valve in valve TAVR indicated with leaflets looking so good.  Consider catheter based sealing of peri prosthetic regurgitation.     [name] 09/19/2013, 11:29 AM           Impression:  Patient underwent aortic valve replacement using a 21 mm bovine pericardial tissue valve in 2010 and has probably at least a moderate perivalvular leak.  He does not have any signs of hemolysis. Peak velocity across the aortic valve has been somewhat elevated ever since surgery but was noted to have increased significantly on the patient's most recent transthoracic echocardiogram. Transesophageal echocardiogram confirms the fact that the patient's valve has not deteriorated to any significant degree and the leaflets are moving normally.  Similarly, the transvalvular gradient measured at catheterization is consistent with the valve size and the patient's resting cardiac output and not suggestive of premature structural valve deterioration.  I am concerned that any further increase in transvalvular gradient might suggest a corresponding increase in severity of the paravalvular leak (aortic insufficiency) causing further compensatory increase in transvalvular flow.  At this point the patient's left ventricular size and systolic function appears stable and the patient claims to be essentially asymptomatic.  Options include (1) continued close observation, (2) redo aortic valve replacement with possible aortic root enlargement to accommodate at least a 23 mm valve, or (3) possible transcatheter plugging of the paravalvular leak.  Transcatheter valve in valve replacement would not be an appropriate treatment option.   Plan:  I reviewed options at length with the patient and his wife in the office today.They had clearly made at their mind prior to coming in for consultation that they were not interested in pursuing any sort of surgical intervention at this time. Given that the patient claims to remain quite stable clinically and that his left ventricular function appears stable, it is probably reasonable to continue to follow him closely without intervention at this time. However, I'm concerned that the gradual rise in the transvalvular gradient across the aortic valve might be related to gradual worsening of the paravalvular leak.  I think it would be reasonable to have his recent echocardiograms reviewed by interventional cardiologists who have experience in using catheter-based technology to plug perivalvular leaks. Ultimately, redo aortic valve replacement with root enlargement might be the best option. The patient will return in one year's time for followup or sooner as needed.    [name], MD 09/23/2013 12:26 PM

## 2013-10-31 ENCOUNTER — Other Ambulatory Visit: Payer: Self-pay

## 2013-12-23 ENCOUNTER — Encounter: Payer: BC Managed Care – PPO | Admitting: Cardiovascular Disease

## 2014-01-23 ENCOUNTER — Other Ambulatory Visit: Payer: Self-pay | Admitting: Cardiovascular Disease

## 2014-08-12 ENCOUNTER — Telehealth: Payer: Self-pay | Admitting: *Deleted

## 2014-09-15 ENCOUNTER — Ambulatory Visit: Payer: BC Managed Care – PPO | Admitting: Thoracic Surgery (Cardiothoracic Vascular Surgery)

## 2014-09-15 ENCOUNTER — Telehealth: Payer: Self-pay | Admitting: *Deleted

## 2014-09-15 NOTE — Telephone Encounter (Signed)
Patient cancelled , he said he is being followed by Dr Eden Emms.

## 2014-11-03 ENCOUNTER — Telehealth: Payer: Self-pay | Admitting: Cardiovascular Disease

## 2014-11-03 NOTE — Telephone Encounter (Signed)
New problem   Pt want to know if he need to have is annual Echocardiogram. Please call pt.

## 2014-11-03 NOTE — Telephone Encounter (Signed)
Yes has old AVR with moderate perivalvular leak and EF 40%  Needs echo and f/u with me

## 2014-11-03 NOTE — Telephone Encounter (Signed)
Patient inquiring if he needs his yearly echo done. S/P AVR in 2010.

## 2014-11-05 ENCOUNTER — Other Ambulatory Visit: Payer: Self-pay

## 2014-11-05 DIAGNOSIS — Z952 Presence of prosthetic heart valve: Secondary | ICD-10-CM

## 2014-11-05 NOTE — Telephone Encounter (Signed)
Order entered for echocardiogram. Also needs followup with Dr. Eden Emms same day.

## 2014-11-26 ENCOUNTER — Encounter: Payer: Self-pay | Admitting: Cardiovascular Disease

## 2014-11-26 ENCOUNTER — Ambulatory Visit (INDEPENDENT_AMBULATORY_CARE_PROVIDER_SITE_OTHER): Payer: BC Managed Care – PPO | Admitting: Cardiovascular Disease

## 2014-11-26 ENCOUNTER — Ambulatory Visit (HOSPITAL_COMMUNITY): Payer: BC Managed Care – PPO | Attending: Cardiology | Admitting: Cardiology

## 2014-11-26 VITALS — BP 120/88 | HR 75 | Ht 72.0 in | Wt 181.4 lb

## 2014-11-26 DIAGNOSIS — I351 Nonrheumatic aortic (valve) insufficiency: Secondary | ICD-10-CM

## 2014-11-26 DIAGNOSIS — T8203XD Leakage of heart valve prosthesis, subsequent encounter: Secondary | ICD-10-CM

## 2014-11-26 DIAGNOSIS — Z952 Presence of prosthetic heart valve: Secondary | ICD-10-CM

## 2014-11-26 DIAGNOSIS — I5022 Chronic systolic (congestive) heart failure: Secondary | ICD-10-CM

## 2014-11-26 DIAGNOSIS — R0602 Shortness of breath: Secondary | ICD-10-CM | POA: Diagnosis not present

## 2014-11-26 DIAGNOSIS — I509 Heart failure, unspecified: Secondary | ICD-10-CM | POA: Diagnosis not present

## 2014-11-26 DIAGNOSIS — I350 Nonrheumatic aortic (valve) stenosis: Secondary | ICD-10-CM

## 2014-11-26 DIAGNOSIS — R002 Palpitations: Secondary | ICD-10-CM

## 2014-11-26 NOTE — Progress Notes (Signed)
Patient ID: [name], male   DOB: 05/23/1956, 58 y.o.   MRN: [id number] [name] is seen today to review his echo. 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 Post op echo 1/11 showed normal EF. He had mild AR likely related to the annular calcification. F/U echo 7/11 showed EF 40-45% MRI done last year showed EF 34% with diffuse hypokinesis. Had no CAD at cath. Echo today continues to show moderate LVE with global hypokinesis worse in septum and apex. EF 40% Mild prosthetic AR. Has had posturlal symptoms with ACE in past. Answered multiple quesitons regarding diagnosis of cardiomyopathy including prognosis, etiology and F/U Also discussed issues with needing a second valve surgery. Given suboptimal results from this surgery I suspect it will be sooner than we would like. 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 Patient had strong feelings preop about avoiding coumadin. Has noted more fatigue and exertional dyspnea Thinks his cardiac "output" is less. Cant hike as far as he could a year ago. No presyncope, TIA , chest pain or palpitations  Echo today reviewed:  Study Conclusions  - Left ventricle: Septal and apical hypokinesis The cavity size was severely dilated. Wall thickness was normal. Systolic function was moderately reduced. The estimated ejection fraction was in the range of 35% to 40%. - Aortic valve: Tissue AVR. Increased systolic gradients. Mild periprosthetic regurgitation. Mean gradient: 45mm Hg (S). Peak gradient: 67mm Hg (S). - Mitral valve: Mild regurgitation. - Left atrium: The atrium was mildly dilated. - Atrial septum: No defect or patent foramen ovale was identified. - Pulmonary arteries: PA peak pressure: 37mm Hg (S).  Echo 08/22/11: Mean gradient 44 mmHg, Peak 72 mmHg and peak CW velocity  4.23  Echo 08/30/12: Mean gradient 45 mmHg, Peak 67 mmHg and peak CW velocity 4.1  Echo 09/11/13 Mean gradient 60 mmHg and Peak 102 mmHg And peak CW velocity 5 Echo 11/26/14  Mean gradient 39 mmHg and Peak 79 mmHg  Cardiopulmonary stress test with more ventilatory limitation 09/04/12  Conclusion: Exercise testing with gas exchange demonstrates a normal functional capacity when compared to matched sedentary norms. This also represents no change in functional capacity from his previous test in Jan 2012. Resting spirometry reveals mild obstruction. At peak exercise, patient appears to be limited most by his ventilation.    Cath 9/15 no CAD mean gradient 28 mmHg  TEE with moderate perivalvular leak leaflets ok Seen by Dr Cornelius Moraswen for surgical consultation and patient did not want to pursue  Long discussion with patient and wife.  My concern is that his LV is dilated and EF moderately reduced.  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  I don't think any centers are doing combined valve in valve and plugging leaks Percutaneously.    The patient continues to have issues with the fact that his valve "was not right from the beginning"  He wants to maximize time to any more surgery and he and his Wife are pretty clear that they want to avoid invasive procedures  if possible  I explained to them that this is not likely possible given the current state of his valve And at sometime and probably in the near future he will need issue addressed.    He is asymptomatic  But more sedentary last few months    Willing to go to tertiary center for review of case and possible l percutaneous Rx of AR    ROS: Denies  fever, malais, weight loss, blurry vision, decreased visual acuity, cough, sputum, SOB, hemoptysis, pleuritic pain, palpitaitons, heartburn, abdominal pain, melena, lower extremity edema, claudication, or rash.  All other systems reviewed and negative  General: Affect appropriate Healthy:  appears stated age HEENT: normal Neck supple with no adenopathy JVP normal no bruits no thyromegaly Lungs clear with no wheezing and good diaphragmatic motion Heart:  S1/S2 AS/AR  murmur, no rub, gallop or click PMI normal Abdomen: benighn, BS positve, no tenderness, no AAA no bruit.  No HSM or HJR Distal pulses intact with no bruits No edema Neuro non-focal Skin warm and dry No muscular weakness   Current Outpatient Prescriptions  Medication Sig Dispense Refill  . Omega-3 Fatty Acids (FISH OIL PO) Take 750 mg by mouth daily.    Marland Kitchen amoxicillin (AMOXIL) 500 MG tablet Take 2,000 mg by mouth once as needed (takes prior to dental surgeries).     Marland Kitchen aspirin EC 81 MG tablet Take 81 mg by mouth daily.    Marland Kitchen lisinopril (PRINIVIL,ZESTRIL) 5 MG tablet TAKE 1 TABLET (5 MG TOTAL) BY MOUTH DAILY (Patient not taking: Reported on 11/26/2014) 90 tablet 0   No current facility-administered medications for this visit.    Allergies  Review of patient's allergies indicates no known allergies.  Electrocardiogram:  SR rate 75  LAD LVH septal and lateral infarct   Assessment and Plan

## 2014-11-26 NOTE — Progress Notes (Signed)
Echo performed. 

## 2014-11-26 NOTE — Patient Instructions (Signed)
Your physician recommends that you schedule a follow-up appointment in:  3 MONTH   WITH  DR NISHAN  Your physician recommends that you continue on your current medications as directed. Please refer to the Current Medication list given to you today.  

## 2014-11-26 NOTE — Assessment & Plan Note (Addendum)
Benign no evidence of PAF  No CAD on cath  Event monitor if persists/worsens

## 2014-11-26 NOTE — Assessment & Plan Note (Signed)
Complicated situation.  Chronically elevated systolic gradients  With 21 mm valve and moderate AR contributing.  Leaflets are not degenerated or calcified on TEE and no strut creep.  Pull back gradient at cath only 28 mmHg  I don't think give his LVE and poor EF already that waiting for him to become symptomatic would be an ideal time to recommend redo surgery as the risk of irreversable DCM and CHF would be higher.  Will refer to ? [facility] or center for 2nd opinion and possible percutaneous procedure to address at least perivalvular regurgitation and see what gradients do if this can be done

## 2014-11-26 NOTE — Assessment & Plan Note (Signed)
Continue ace inhibitor EF moderately reduced By echo has been 30-40% over last 2 years  Consider f/u MRI/MUGA Given low EF not clear that strain imaging would be useful to follow contractility as EF already moderately reduced

## 2014-12-04 ENCOUNTER — Encounter (HOSPITAL_COMMUNITY): Payer: Self-pay | Admitting: Cardiovascular Disease

## 2014-12-15 ENCOUNTER — Telehealth: Payer: Self-pay | Admitting: *Deleted

## 2014-12-15 NOTE — Telephone Encounter (Signed)
LMTCB ./CY 

## 2014-12-15 NOTE — Telephone Encounter (Signed)
-----   Message from Alois Cliche, LPN sent at 46/80/3212  1:33 PM EST ----- CALL PT   NEXT  WEEK  AND  SEE IF  HAS  APPT  AT  [facility]  .

## 2014-12-16 NOTE — Telephone Encounter (Signed)
PER  PT   HAS  SEEN DR  Modesto Charon  AND  HAVE  COME  UP  WITH  TX PLAN./CY

## 2015-03-06 ENCOUNTER — Ambulatory Visit: Payer: BC Managed Care – PPO | Admitting: Cardiovascular Disease

## 2015-03-13 ENCOUNTER — Encounter: Payer: Self-pay | Admitting: Thoracic Surgery (Cardiothoracic Vascular Surgery)

## 2015-04-13 ENCOUNTER — Encounter: Payer: 59 | Admitting: Thoracic Surgery (Cardiothoracic Vascular Surgery)

## 2015-05-04 ENCOUNTER — Institutional Professional Consult (permissible substitution) (INDEPENDENT_AMBULATORY_CARE_PROVIDER_SITE_OTHER): Payer: 59 | Admitting: Thoracic Surgery (Cardiothoracic Vascular Surgery)

## 2015-05-04 ENCOUNTER — Encounter: Payer: Self-pay | Admitting: Thoracic Surgery (Cardiothoracic Vascular Surgery)

## 2015-05-04 VITALS — BP 123/75 | HR 70 | Resp 16 | Ht 72.0 in | Wt 182.0 lb

## 2015-05-04 DIAGNOSIS — Z953 Presence of xenogenic heart valve: Secondary | ICD-10-CM

## 2015-05-04 DIAGNOSIS — I350 Nonrheumatic aortic (valve) stenosis: Secondary | ICD-10-CM | POA: Diagnosis not present

## 2015-05-04 DIAGNOSIS — I351 Nonrheumatic aortic (valve) insufficiency: Secondary | ICD-10-CM

## 2015-05-04 DIAGNOSIS — Z954 Presence of other heart-valve replacement: Secondary | ICD-10-CM

## 2015-05-04 DIAGNOSIS — T8203XD Leakage of heart valve prosthesis, subsequent encounter: Secondary | ICD-10-CM

## 2015-05-04 NOTE — Progress Notes (Signed)
[address]       [name] [address]             [phone]     CARDIOTHORACIC SURGERY CONSULTATION REPORT  Referring Provider is Clovis Riley, L.[name], MD  Primary Cardiologist is Wendall Stade, MD PCP is Lupe Carney, MD  Chief Complaint  Patient presents with  . Aortic Stenosis    HPI:  Patient is a 59 year old gentleman with history of bicuspid aortic valve disease who underwent aortic valve replacement using a bioprosthetic tissue valve via right mini thoracotomy approach December 2010. The patient's valve was replaced using a 21 mm [facility] Ease bovine pericardial tissue valve. Early postoperatively the patient was noted to have a mild paravalvular leak and somewhat elevated transvalvular gradients across the aortic valve suggestive of mild patient-prosthesis mismatch. Peak velocity across the aortic valve was measured 3.7 m/sec at the time of his first post-operative echocardiogram performed in January 2011, corresponding to a mean transvalvular gradient of 29 mmHg. At that time left ventricular systolic function appeared normal with ejection fraction estimated 55%.  Since that time serial transthoracic echocardiograms have demonstrated a gradual decline in left ventricular systolic function with ejection fraction estimated 30-35% on the most recent echocardiogram performed 11/26/2014.  Subsequent echocardiograms have also suggested that the paravalvular leak may have increased in severity, but there have been no signs of leaflet dysfunction or calcification.  However, over time the transvalvular gradient across the aortic valve has gradually increased.  Most recent transthoracic echocardiogram was notable for a peak velocity across the aortic valve measured greater than 4 m/sec corresponding to a mean transvalvular gradient of 39 mmHg.  The patient was recently referred to [facility] to consider percutaneous treatment options for  management of the patient's prosthetic valve disease and paravalvular leak. Redo aortic valve replacement was recommended. The patient has subsequently requested follow-up consultation to discuss treatment options further.  The patient is married and lives locally in [place] with his wife. He retired from his previous job several years ago but has recently resumed working as a Firefighter. He lives a active physical lifestyle and enjoys hiking and backpacking.  He denies any symptoms of exertional shortness of breath, but he does report a noticeable decline in his exercise tolerance over the past year or so. He states that he gets tired and winded more easily with strenuous activity then he has in the past. He has had some occasional dizzy spells when he stands up suddenly although he denies syncopal episode. He has never had any exertional chest pain or chest tightness. He denies any history of PND, orthopnea, or lower extremity edema.  Past Medical History  Diagnosis Date  . Rosacea   . Reflux   . Hearing loss     bilateral, pt wears hearing aids   . Aortic valve disease     a. Biscuspid AVR with heavy annular calcification s/p tissue AVR (pt preferred). b. Mod-severe perivalvular regurg by cath 08/2013, mod by TEE 08/2013, gradients lower by TEE than by echo.   . Nonischemic cardiomyopathy     a. EF normal immediately after AVR, then decreased to 40-45% by echo & 34% by MRI 2011. b. Most recent EF 40-45% by echo/TEE 08/2013.  Marland Kitchen HTN (hypertension)   . Paravalvular leak of prosthetic heart valve   . Aortic insufficiency   . S/P aortic valve replacement with bioprosthetic valve 12/08/2009    21mm [employer]  bovine pericardial tissue valve placed via right mini thoracotomy approach    Past Surgical History  Procedure Laterality Date  . Aortic valve replacement  12/08/2009    Procedure: Right miniature anterior thoracotomy for aortic valve replacement (32-mm [facility] Ease  pericardial tissue valve) Surgeon: [name], MD assistant: Kerin Perna, MD  . Inguinal hernia repair      Left  . Inguinal hernia repair  1960    right side  . Tooth extraction Left August 2014    left bottom molar  . Tee without cardioversion N/A 09/19/2013    Procedure: TRANSESOPHAGEAL ECHOCARDIOGRAM (TEE);  Surgeon: Wendall Stade, MD;  Location: [facility] ENDOSCOPY;  Service: Cardiovascular;  Laterality: N/A;  . Left and right heart catheterization with coronary angiogram N/A 09/19/2013    Procedure: LEFT AND RIGHT HEART CATHETERIZATION WITH CORONARY ANGIOGRAM;  Surgeon: Wendall Stade, MD;  Location: [facility] CATH LAB;  Service: Cardiovascular;  Laterality: N/A;    History reviewed. No pertinent family history.  History   Social History  . Marital Status: Married    Spouse Name: N/A  . Number of Children: 2  . Years of Education: N/A   Occupational History  . Business Engineer, manufacturing systems     Social History Main Topics  . Smoking status: Never Smoker   . Smokeless tobacco: Never Used  . Alcohol Use: 0.6 oz/week    1 Cans of beer per week     Comment: occasional  . Drug Use: No  . Sexual Activity: Not on file   Other Topics Concern  . Not on file   Social History Narrative   Exercises 4 days per week    Current Outpatient Prescriptions  Medication Sig Dispense Refill  . amoxicillin (AMOXIL) 500 MG tablet Take 2,000 mg by mouth once as needed (takes prior to dental surgeries).     . Omega-3 Fatty Acids (FISH OIL PO) Take 750 mg by mouth daily.     No current facility-administered medications for this visit.    No Known Allergies    Review of Systems:   General:  normal appetite, normal energy, no weight gain, no weight loss, no fever  Cardiac:  no chest pain with exertion, no chest pain at rest, + SOB with strenuous exertion, no resting SOB, no PND, no orthopnea, no palpitations, no arrhythmia, no atrial fibrillation, no LE edema, occasional  dizzy spells, no syncope  Respiratory:  no shortness of breath, no home oxygen, no productive cough, no dry cough, no bronchitis, no wheezing, no hemoptysis, no asthma, no pain with inspiration or cough, no sleep apnea, no CPAP at night  GI:   no difficulty swallowing, no reflux, no frequent heartburn, no hiatal hernia, no abdominal pain, no constipation, no diarrhea, no hematochezia, no hematemesis, no melena  GU:   no dysuria,  no frequency, no urinary tract infection, no hematuria, no enlarged prostate, no kidney stones, no kidney disease  Vascular:  no pain suggestive of claudication, no pain in feet, no leg cramps, no varicose veins, no DVT, no non-healing foot ulcer  Neuro:   no stroke, no TIA's, no seizures, no headaches, no temporary blindness one eye,  no slurred speech, no peripheral neuropathy, no chronic pain, no instability of gait, no memory/cognitive dysfunction  Musculoskeletal: no arthritis, no joint swelling, no myalgias, no difficulty walking, normal mobility   Skin:   no rash, no itching, no skin infections, no pressure sores or ulcerations  Psych:   no  anxiety, no depression, no nervousness, no unusual recent stress  Eyes:   no blurry vision, no floaters, no recent vision changes, no wears glasses or contacts  ENT:   no hearing loss, no loose or painful teeth, no dentures, last saw dentist within the past year  Hematologic:  no easy bruising, no abnormal bleeding, no clotting disorder, no frequent epistaxis  Endocrine:  no diabetes, does not check CBG's at home     Physical Exam:   BP 123/75 mmHg  Pulse 70  Resp 16  Ht 6' (1.829 m)  Wt 182 lb (82.555 kg)  BMI 24.68 kg/m2  SpO2 98%  General:    well-appearing  HEENT:  Unremarkable   Neck:   no JVD, no bruits, no adenopathy   Chest:   clear to auscultation, symmetrical breath sounds, no wheezes, no rhonchi   CV:   RRR, grade III/VI systolic murmur, no diastolic murmur noted  Abdomen:  soft, non-tender, no masses    Extremities:  warm, well-perfused, pulses palpable, no LE edema  Rectal/GU  Deferred  Neuro:   Grossly non-focal and symmetrical throughout  Skin:   Clean and dry, no rashes, no breakdown   Diagnostic Tests:  Transthoracic Echocardiography  Patient:  [name], [name] MR #:    [id number] Study Date: 01/06/2010 Gender:   M Age:    54 Height:   182.9cm Weight:   84.4kg BSA:    2.75m^2 Pt. Status: Room:  ADMITTING  [name], MD, [facility] ATTENDING  [name], MD, [facility] ORDERING   [name], MD, [facility] SONOGRAPHER [facility] PERFORMING  Redge Gainer, [facility] cc:  -------------------------------------------------------------------- Indications:  Aortic insufficiency 424.1.  -------------------------------------------------------------------- History: PMH: c/o occasional palpitations. Patient is one month s/p AVR using minimally invasive procedure. He had a bicuspid AV with severe AS.Post surgery, a perivalvular leak was seen by TEE.  -------------------------------------------------------------------- Study Conclusions  - Left ventricle: septal hypokinesis The cavity size was mildly  dilated. Wall thickness was increased in a pattern of mild LVH.  Systolic function was normal. The estimated ejection fraction was  55%, in the range of 60% to 65%. Wall motion was normal; there  were no regional wall motion abnormalities. Left ventricular  diastolic function parameters were normal. - Aortic valve: Tissue AVR not well visualized. Mild periprosthetic  leak and somewhat elevated gradients at baseline Mild  regurgitation. Valve area: 0.52cm^2(VTI). Valve area: 0.58cm^2  (Vmax). - Mitral valve: Calcified annulus. Mildly thickened leaflets . - Left atrium: The atrium was mildly dilated. - Atrial septum: No defect or patent foramen ovale was identified. Transthoracic echocardiography. M-mode,  complete 2D, spectral Doppler, and color Doppler. Height: Height: 182.9cm. Height: 72in. Weight: Weight: 84.4kg. Weight: 185.6lb. Body mass index: BMI: 25.2kg/m^2. Body surface area: BSA: 2.13m^2. Blood pressure: 120/70. Patient status: Outpatient. Location: [place] [facility]  --------------------------------------------------------------------  -------------------------------------------------------------------- Left ventricle: septal hypokinesis The cavity size was mildly dilated. Wall thickness was increased in a pattern of mild LVH. Systolic function was normal. The estimated ejection fraction was 55%, in the range of 60% to 65%. Wall motion was normal; there were no regional wall motion abnormalities. The transmitral flow pattern was normal. The deceleration time of the early transmitral flow velocity was normal. The pulmonary vein flow pattern was normal. The tissue Doppler parameters were normal. Left ventricular diastolic function parameters were normal.  -------------------------------------------------------------------- Aortic valve: Tissue AVR not well visualized. Mild periprosthetic leak and somewhat elevated gradients at baseline Poorly visualized. Doppler: Transvalvular velocity was minimally increased. Mild  regurgitation. Valve area: 0.52cm^2(VTI). Indexed valve area: 0.25cm^2/m^2 (VTI). Valve area: 0.58cm^2 (Vmax). Indexed valve area: 0.28cm^2/m^2 (Vmax). Mean gradient: 40mm Hg (S). Peak gradient: 12mm Hg (S).  -------------------------------------------------------------------- Mitral valve: Calcified annulus. Mildly thickened leaflets . Doppler: Trivial regurgitation. Peak gradient: 42mm Hg (D).  -------------------------------------------------------------------- Left atrium: The atrium was mildly dilated.  -------------------------------------------------------------------- Atrial septum: No defect or patent foramen ovale was  identified.  -------------------------------------------------------------------- Right ventricle: The cavity size was normal. Wall thickness was normal. Systolic function was normal.  -------------------------------------------------------------------- Pulmonic valve: Doppler: Trivial regurgitation.  -------------------------------------------------------------------- Tricuspid valve: Doppler: Mild regurgitation.  -------------------------------------------------------------------- Right atrium: The atrium was normal in size.  -------------------------------------------------------------------- Pericardium: The pericardium was normal in appearance.  -------------------------------------------------------------------- Systemic veins: Inferior vena cava: The vessel was normal in size; the respirophasic diameter changes were in the normal range (= 50%); findings are consistent with normal central venous pressure.  --------------------------------------------------------------------  2D measurements      Normal Doppler measurements   Normal Left ventricle           Aortic valve LVID ED,     52 mm   43-52  Peak vel, S 373 cm/s   ------ chord, PLAX            Mean vel, S 243 cm/s   ------ LVID ES,     30 mm   23-38  VTI, S    80. cm    ------ chord, PLAX                    9 FS, chord,    42 %   >29   Mean     29 mm Hg  ------ PLAX                gradient, S LVPW, ED     13 mm   ------ Peak     56 mm Hg  ------ IVS/LVPW    0.92    <1.3  gradient, S ratio, ED             Area, VTI  0.5 cm^2   ------ Vol ED, MOD1  136 ml   ------         2 Vol ES, MOD1   47 ml   ------ Area index  0.2 cm^2/m^2 ------ EF, MOD1     65 %   ------ (VTI)     5 Vol index, ED,  66 ml/m^2 ------  Area, Vmax  0.5 cm^2   ------ MOD1                       8 Vol index, ES,  23 ml/m^2 ------ Area index  0.2 cm^2/m^2 ------ MOD1                (Vmax)     8 Vol ED, MOD2  106 ml   ------ Regurg PHT  539 ms    ------ Vol ES, MOD2   35 ml   ------ Mitral valve EF, MOD2     67 %   ------ Peak E vel  102 cm/s   ------ Stroke vol,   71 ml   ------ Peak A vel  83. cm/s   ------ MOD2                       4 Vol index, ED,  51 ml/m^2 ------ Deceleration 176 ms    150-23 MOD2  time           0 Vol index, ES,  17 ml/m^2 ------ Peak      4 mm Hg  ------ MOD2                gradient, D Stroke index, 34.3 ml/m^2 ------ Peak E/A   1.2     ------ MOD2                ratio Ventricular septum         Tricuspid valve IVS, ED     12 mm   ------ Regurg peak 240 cm/s   ------ Aorta               vel Root diam, ED  27 mm   ------ Peak RV-RA  23 mm Hg  ------ Left atrium            gradient, S AP dim      35 mm   ------ AP dim index  1.69 cm/m^2 <2.2 Right ventricle RVID ED, PLAX  24 mm   19-38  -------------------------------------------------------------------- Prepared and Electronically Authenticated by  [name], MD, [facility] 2011-01-12T18:43:[id number]    Cardiac Catheterization Procedure Note  Name: [name] MRN: [id number] DOB: October 15, 1956  Procedure: Right Heart Cath, Left Heart Cath, Selective Coronary Angiography, LV angiography  Indication: Aortic stenosis/regurgitation post tissue AVR with 21mm [employer] Ease bovine valve  Procedural Details: The right groin was prepped, draped, and anesthetized with 1% lidocaine. Using the modified Seldinger technique a 5 French sheath was placed in the  right femoral artery and a 7 French sheath was placed in the right femoral vein. A Swan-Ganz catheter was used for the right heart catheterization. Standard protocol was followed for recording of right heart pressures and sampling of oxygen saturations. Fick cardiac output was calculated. Standard Judkins catheters were used for selective coronary angiography and left ventriculography. There were no immediate procedural complications. The patient was transferred to the post catheterization recovery area for further monitoring.  Procedural Findings: Hemodynamics RA 8 RV 30/4 PA 22/12 PCWP 14 LV 133/10 AO 105/63  Oxygen saturations: PA 68% AO 98%  Cardiac Output (Fick) 4.33  Cardiac Index (Fick) 2.1   AV peak to peak gradient 28 mmHg mean gradient 28.42mm Hg Calculated AVA .93 mmHg  Coronary angiography: Coronary dominance: right  Left mainstem: Normal  Left anterior descending (LAD): Normal  Left circumflex (LCx): Normal  Right coronary artery (RCA): Normal  Aortic Root: Moderate to severe peri valvular regurgitation No aortic root dilatation.   Final Conclusions: AS/AR Recommendations: Will discuss further with Dr Cornelius Moras and Excell Seltzer. By TEE the struts are not crimped and the leaflets look nice with planimetry area of 1.6cm2 High gradients (mean and peak 102 mmHg) by echo may be related to small valve with significant regurgitation causing high CO. Do not think valve in valve TAVR indicated with leaflets looking so good. Consider catheter based sealing of peri prosthetic regurgitation.    [name] 09/19/2013, 11:29 AM      Transesophageal Echocardiography  Patient:  [name], [name] MR #:    [id number] Study Date: 09/19/2013 Gender:   M Age:    57 Height:   182.9cm Weight:   84.1kg BSA:    2.88m^2 Pt. Status: Room:    [facility]  ADMITTING  [name] ATTENDING  Jani Gravel PERFORMING  Gweneth Dimitri SONOGRAPHER Jimmy Reel cc:  ------------------------------------------------------------ LV  EF: 40%  ------------------------------------------------------------ Indications:   Aortic stenosis 424.1.  ------------------------------------------------------------ Study Conclusions  - Left ventricle: Hypertrophy was noted. The estimated ejection fraction was 40%. Diffuse hypokinesis. - Aortic valve: Tissue AVR Moderate peri valvular leak. Elevated gradients See TTE and cath report Leaflets appear normal with no calcification or restriction to motion Planimetry Valve Areo 1.6cm2 - Mitral valve: No evidence of vegetation. - Left atrium: The atrium was dilated. - Right atrium: No evidence of thrombus in the atrial cavity or appendage. - Atrial septum: No defect or patent foramen ovale was identified. Echo contrast study showed no right-to-left atrial level shunt, following an increase in RA pressure induced by provocative maneuvers. - Tricuspid valve: No evidence of vegetation. - Pulmonic valve: No evidence of vegetation. Transesophageal echocardiography. 2D and color Doppler. Height: Height: 182.9cm. Height: 72in. Weight: Weight: 84.1kg. Weight: 185lb. Body mass index: BMI: 25.1kg/m^2. Body surface area:  BSA: 2.55m^2. Blood pressure: 126/81. Patient status: Outpatient. Location: Endoscopy.  ------------------------------------------------------------  ------------------------------------------------------------ Left ventricle: Hypertrophy was noted. The estimated ejection fraction was 40%. Diffuse hypokinesis.  ------------------------------------------------------------ Aortic valve: Tissue AVR Moderate peri valvular leak. Elevated gradients See TTE and cath report Leaflets appear normal with no calcification or restriction to motion Planimetry Valve Areo 1.6cm2 Doppler:    Mean gradient: 37mm Hg (S). Peak gradient: 58mm Hg (S).  ------------------------------------------------------------ Aorta: There was no evidence for dissection.  ------------------------------------------------------------ Mitral valve:  Structurally normal valve.  Leaflet separation was normal. No evidence of vegetation. Doppler: No regurgitation.  ------------------------------------------------------------ Left atrium: The atrium was dilated.  ------------------------------------------------------------ Atrial septum: No defect or patent foramen ovale was identified. Echo contrast study showed no right-to-left atrial level shunt, following an increase in RA pressure induced by provocative maneuvers.  ------------------------------------------------------------ Right ventricle: The cavity size was normal. Wall thickness was normal. Systolic function was normal.  ------------------------------------------------------------ Pulmonic valve:  Structurally normal valve.  Cusp separation was normal. No evidence of vegetation.  ------------------------------------------------------------ Tricuspid valve:  Structurally normal valve.  Leaflet separation was normal. No evidence of vegetation. Doppler: Mild regurgitation.  ------------------------------------------------------------ Right atrium: The atrium was normal in size. No evidence of thrombus in the atrial cavity or appendage.  ------------------------------------------------------------ Pericardium: The pericardium was normal in appearance. There was no pericardial effusion.  ------------------------------------------------------------  2D measurements  Normal    Doppler measurements  Normal LVOT              Aortic valve Diam, S  15 mm  ------    Peak vel, S  381 cm/s ------ Area  1.77 cm^2 ------    Mean vel, S  289 cm/s ------                VTI,  S    93.6 cm  ------                Mean      37 mm  ------                gradient, S    Hg                Peak      58 mm  ------                gradient, S    Hg  ------------------------------------------------------------ Prepared and Electronically Authenticated by  [name] 2014-10-06T10:12:[id number]     Transthoracic Echocardiography  Patient:  [name], [name] MR #:    [id number] Study Date: 11/26/2014 Gender:   M Age:  58 Height:   182.9 cm Weight:   84.4 kg BSA:    2.08 m^2 Pt. Status: Room:  Layla Maw, M.D. REFERRING  [name], M.D. ATTENDING  Willa Rough, MD SONOGRAPHER Randa Evens, Will PERFORMING  [facility], Outpatient  cc:  ------------------------------------------------------------------- LV EF: 30% -  35%  ------------------------------------------------------------------- Indications:   (R06.02).  ------------------------------------------------------------------- History:  PMH: AVR. Acquired from the patient and from the patient&'s chart. Congestive heart failure.  ------------------------------------------------------------------- Study Conclusions  - Left ventricle: The cavity size was moderately dilated. Wall thickness was increased in a pattern of mild LVH. Systolic function was moderately to severely reduced. The estimated ejection fraction was in the range of 30% to 35%. - Aortic valve: Failing tissue valve. Moderate perivalvular regurgitation and severe increase in systolic gradients similar to previous echo. - Mitral valve: There was mild regurgitation. - Left atrium: The atrium was mildly dilated. - Atrial septum: No defect or patent foramen ovale was identified.  Transthoracic echocardiography. M-mode, complete 2D, spectral Doppler, and color Doppler. Birthdate: Patient  birthdate: October 18, 1956. Age: Patient is 59 yr old. Sex: Gender: male. BMI: 25.2 kg/m^2. Blood pressure:   122/84 Patient status: Outpatient. Study date: Study date: 11/26/2014. Study time: 01:31 PM. Location: [place] [facility]  -------------------------------------------------------------------  ------------------------------------------------------------------- Left ventricle: The cavity size was moderately dilated. Wall thickness was increased in a pattern of mild LVH. Systolic function was moderately to severely reduced. The estimated ejection fraction was in the range of 30% to 35%.  ------------------------------------------------------------------- Aortic valve: Failing tissue valve. Moderate perivalvular regurgitation and severe increase in systolic gradients similar to previous echo. Doppler:   VTI ratio of LVOT to aortic valve: 0.21. Valve area (VTI): 0.78 cm^2. Indexed valve area (VTI): 0.38 cm^2/m^2. Valve area (Vmax): 0.71 cm^2. Indexed valve area (Vmax): 0.34 cm^2/m^2. Mean velocity ratio of LVOT to aortic valve: 0.2. Valve area (Vmean): 0.75 cm^2. Indexed valve area (Vmean): 0.36 cm^2/m^2.  Mean gradient (S): 39 mm Hg. Peak gradient (S): 79 mm Hg.  ------------------------------------------------------------------- Aorta: The aorta was normal, not dilated, and non-diseased.  ------------------------------------------------------------------- Mitral valve:  Mildly thickened leaflets . Doppler: There was mild regurgitation.  ------------------------------------------------------------------- Left atrium: The atrium was mildly dilated.  ------------------------------------------------------------------- Atrial septum: No defect or patent foramen ovale was identified.  ------------------------------------------------------------------- Right ventricle: The cavity size was normal. Wall thickness was normal. Systolic function was  normal.  ------------------------------------------------------------------- Pulmonic valve:  Structurally normal valve.  Cusp separation was normal. Doppler: Transvalvular velocity was within the normal range. There was mild regurgitation.  ------------------------------------------------------------------- Tricuspid valve:  Structurally normal valve.  Leaflet separation was normal. Doppler: Transvalvular velocity was within the normal range. There was mild regurgitation.  ------------------------------------------------------------------- Right atrium: The atrium was normal in size.  ------------------------------------------------------------------- Pericardium: The pericardium was normal in appearance.  ------------------------------------------------------------------- Post procedure conclusions Ascending Aorta:  - The aorta was normal, not dilated, and non-diseased.  ------------------------------------------------------------------- Measurements  Left ventricle              Value     Reference LV ID, ED, PLAX chordal      (H)   56.1 mm    43 - 52 LV ID, ES, PLAX chordal      (H)   43.1 mm    23 - 38 LV fx shortening, PLAX chordal  (L)   23  %    >=29 LV PW thickness, ED            8.17 mm    --------- IVS/LV PW ratio,  ED        (H)   1.49      <=1.3 Stroke volume, 2D             65  ml    --------- Stroke volume/bsa, 2D           31  ml/m^2  --------- LV e&', lateral              9.85 cm/s   --------- LV E/e&', lateral             6.86      --------- LV e&', medial               8.68 cm/s   --------- LV E/e&', medial              7.79      --------- LV e&', average              9.27 cm/s   --------- LV E/e&', average             7.3       ---------  Ventricular septum            Value     Reference IVS thickness, ED             12.2 mm    ---------  LVOT                   Value     Reference LVOT ID, S                22  mm    --------- LVOT area                 3.8  cm^2   --------- LVOT ID                  22  mm    --------- LVOT peak velocity, S           83.5 cm/s   --------- LVOT mean velocity, S           56.1 cm/s   --------- LVOT VTI, S                17.1 cm    --------- LVOT peak gradient, S           3   mm Hg  --------- Stroke volume (SV), LVOT DP        65  ml    --------- Stroke index (SV/bsa), LVOT DP      31.3 ml/m^2  ---------  Aortic valve               Value     Reference Aortic valve mean velocity, S       284  cm/s   --------- Aortic valve VTI, S            82.8 cm    --------- Aortic mean gradient, S          39  mm Hg  --------- Aortic peak gradient, S          79  mm Hg  --------- VTI ratio, LVOT/AV            0.21      --------- Aortic valve area, VTI          0.78 cm^2   --------- Aortic valve area/bsa, VTI        0.38 cm^2/m^2 ---------  Aortic valve area, peak velocity     0.71 cm^2   --------- Aortic valve area/bsa, peak        0.34 cm^2/m^2 --------- velocity Velocity ratio, mean, LVOT/AV       0.2      --------- Aortic valve area, mean velocity     0.75 cm^2   --------- Aortic valve area/bsa, mean        0.36 cm^2/m^2 --------- velocity Aortic regurg pressure half-time     395  ms    ---------  Aorta                   Value     Reference Aortic root ID, ED             31  mm    --------- Ascending aorta ID, A-P, S        34  mm    ---------  Left atrium                Value     Reference LA ID, A-P, ES              43  mm    --------- LA ID/bsa, A-P              2.07 cm/m^2  <=2.2 LA volume, S               72  ml    --------- LA volume/bsa, S             34.7 ml/m^2  --------- LA volume, ES, 1-p A4C          75  ml    --------- LA volume/bsa, ES, 1-p A4C        36.1 ml/m^2  --------- LA volume, ES, 1-p A2C          66  ml    --------- LA volume/bsa, ES, 1-p A2C        31.8 ml/m^2  ---------  Mitral valve               Value     Reference Mitral E-wave peak velocity        67.6 cm/s   --------- Mitral A-wave peak velocity        52.8 cm/s   --------- Mitral deceleration time     (H)   320  ms    150 - 230 Mitral E/A ratio, peak          1.2      ---------  Pulmonary arteries            Value     Reference PA pressure, S, DP            21  mm Hg  <=30  Tricuspid valve              Value     Reference Tricuspid regurg peak velocity      214  cm/s   --------- Tricuspid peak RV-RA gradient       18  mm Hg  ---------  Systemic veins              Value     Reference Estimated CVP               3   mm Hg  ---------  Right ventricle              Value     Reference RV pressure,  S, DP            21  mm Hg  <=30 RV s&', lateral, S             17.4 cm/s   ---------  Legend: (L) and (H) mark values outside specified reference range.  ------------------------------------------------------------------- Prepared and Electronically Authenticated  by  [name], M.D. 2015-12-02T14:48:13       CT ANGIOGRAM  Both the report and images from non-gated CT angiogram of the chest performed 02/16/2015 at [facility] in reverse [facility] were reviewed.  The patient has normal sized ascending thoracic aorta. The report comments on a "focal outpouching of the non-coronary artery cusp that extends 7 mm beyond the aortic valve".  I am not personally impressed that there is anything abnormal in this area. The patient may have a somewhat asymmetrical aortic root given his history of bicuspid aortic valve disease, but there is nothing to suggest the presence of an aneurysm or false aneurysm. This was a non-gated scan.  The report also comments on the presence of a 4 mm nodule in the right middle lobe that has benign characteristics.     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   I have personally reviewed the patient's last several echocardiograms, the diagnostic cardiac catheterization performed September 2014, and CT angiogram performed recently at [facility].  Although there is no significant calcification of the valve leaflets, the patient has patient prosthesis mismatch that is exacerbated by the presence of increased flow due to a significant paravalvular leak with moderate aortic insufficiency.  Peak velocity across the aortic valve has gradually increased over time and is now measuring > 4 m/sec, consistent with severe aortic stenosis.  The patient has remained clinically stable over time, but over the past year he has noticed a slight drop in his exercise tolerance.  Follow-up echocardiograms have demonstrated a gradual fall in left ventricular systolic function, with recent ejection fraction down to 30-35%. Under the circumstances I agree that it makes sense to consider redo aortic valve replacement in the near future.  He  may require aortic root enlargement or root replacement to facilitate placement of an aortic valve prosthesis with adequate orifice area to diminish the likelihood of significant residual patient prosthesis mismatch.  Use of a low-profile bileaflet mechanical prosthesis might prove to be optimal for numerous reasons, most notably the avoidance of the potential for late structural valve deterioration and failure.  Alternatives to surgery include continued medical therapy with very close follow-up, but I'm concerned by the gradual decline in the patient's left ventricular function, the onset of symptoms, and the gradual rise in the peak velocity across the aortic valve.   Plan:  The patient and his wife were counseled at length regarding treatment alternatives with respect to the patient's prosthetic valve dysfunction including continued medical therapy versus proceeding with conventional surgical redo aortic valve replacement using either a mechanical prosthesis or a bioprosthetic tissue valve.  The rationale for proceeding with surgery sooner rather than later was discussed at length, as were risks associated with continued medical therapy.  Other alternatives that are not recommended were discussed, including transcatheter aortic valve replacement.  An attempt at percutaneous closure of the patient's paravalvular leak could be considered, but this would not address the underlying patient prosthesis mismatch and as a result I would remain concerned that the patient's left ventricular function might continue  to deteriorate over time. Discussion was held comparing the relative risks of mechanical valve replacement with need for lifelong anticoagulation versus use of a bioprosthetic tissue valve and the associated potential for late structural valve deterioration and failure.  The impact that redo surgery has on the risks associated with surgery were discussed.  Expectations for the patient's postoperative  convalescence were discussed. The patient desires to think matters over before making a final decision.  If he decides to proceed with surgery he probably should undergo follow-up left and right heart catheterization since it has been over a year and a half since his last cardiac catheterization.  I have encouraged the patient to follow up closely with Dr. Eden Emms and undergo serial echocardiograms every 6 months if he does not decide to proceed with surgery in the near future. All of his questions have been addressed.   I spent in excess of 90 minutes during the conduct of this office consultation and >50% of this time involved direct face-to-face encounter with the patient for counseling and/or coordination of their care.   [name], MD 05/04/2015 5:32 PM

## 2015-05-04 NOTE — Patient Instructions (Signed)
Consider redo aortic valve replacement in the near future.  At the very least the patient is recommended to continue to follow up with his cardiologist every 6 months for serial echocardiograms if he decides not to proceed with surgery in the near future.

## 2015-07-27 ENCOUNTER — Encounter: Payer: Self-pay | Admitting: Cardiovascular Disease

## 2016-07-29 DIAGNOSIS — Z125 Encounter for screening for malignant neoplasm of prostate: Secondary | ICD-10-CM | POA: Diagnosis not present

## 2016-07-29 DIAGNOSIS — I351 Nonrheumatic aortic (valve) insufficiency: Secondary | ICD-10-CM | POA: Diagnosis not present

## 2016-07-29 DIAGNOSIS — Z0000 Encounter for general adult medical examination without abnormal findings: Secondary | ICD-10-CM | POA: Diagnosis not present

## 2016-07-29 DIAGNOSIS — Z1211 Encounter for screening for malignant neoplasm of colon: Secondary | ICD-10-CM | POA: Diagnosis not present

## 2016-08-24 ENCOUNTER — Telehealth: Payer: Self-pay

## 2016-08-24 NOTE — Telephone Encounter (Signed)
Tried to call patient to schedule an appointment with Dr. Eden Emms.

## 2016-08-25 DIAGNOSIS — H903 Sensorineural hearing loss, bilateral: Secondary | ICD-10-CM | POA: Diagnosis not present

## 2016-08-26 ENCOUNTER — Telehealth: Payer: Self-pay

## 2016-08-26 DIAGNOSIS — Z952 Presence of prosthetic heart valve: Secondary | ICD-10-CM

## 2016-08-26 DIAGNOSIS — I420 Dilated cardiomyopathy: Secondary | ICD-10-CM

## 2016-08-26 NOTE — Telephone Encounter (Signed)
Patient needs an echo before his appointment. Patient needs an appointment for both. Will route to scheduling.

## 2016-09-01 NOTE — Telephone Encounter (Signed)
Patient has an appointment with Dr. Eden Emms and an echo on 11/16/16.

## 2016-09-01 NOTE — Telephone Encounter (Signed)
Patient has an appointment with Dr. Nishan and an echo on 11/16/16. 

## 2016-10-04 ENCOUNTER — Telehealth (HOSPITAL_COMMUNITY): Payer: Self-pay | Admitting: Cardiovascular Disease

## 2016-10-04 NOTE — Telephone Encounter (Signed)
New Message:  Pt called in wanting to knos if he could be seen sooner than 11/22 to get the results to his echo that is scheduled on 10/23. Please f/u with pt.  Thanks

## 2016-10-04 NOTE — Telephone Encounter (Signed)
Informed patient that next available office visit with Dr. Eden Emms is on 01/03/16. Patient stated he did not want to wait until then. Patient stated he will keep his appointment on 11/16/16. Informed patient that our office will call him about his echo results before his appointment. Patient verbalized understanding.

## 2016-10-17 ENCOUNTER — Ambulatory Visit (HOSPITAL_COMMUNITY): Payer: BLUE CROSS/BLUE SHIELD | Attending: Cardiovascular Disease

## 2016-10-17 ENCOUNTER — Other Ambulatory Visit: Payer: Self-pay

## 2016-10-17 DIAGNOSIS — Z9889 Other specified postprocedural states: Secondary | ICD-10-CM | POA: Diagnosis not present

## 2016-10-17 DIAGNOSIS — I340 Nonrheumatic mitral (valve) insufficiency: Secondary | ICD-10-CM | POA: Insufficient documentation

## 2016-10-17 DIAGNOSIS — Z952 Presence of prosthetic heart valve: Secondary | ICD-10-CM | POA: Diagnosis not present

## 2016-10-17 DIAGNOSIS — I509 Heart failure, unspecified: Secondary | ICD-10-CM | POA: Insufficient documentation

## 2016-10-17 DIAGNOSIS — I420 Dilated cardiomyopathy: Secondary | ICD-10-CM | POA: Diagnosis not present

## 2016-10-17 DIAGNOSIS — I071 Rheumatic tricuspid insufficiency: Secondary | ICD-10-CM | POA: Insufficient documentation

## 2016-10-17 DIAGNOSIS — I351 Nonrheumatic aortic (valve) insufficiency: Secondary | ICD-10-CM | POA: Diagnosis not present

## 2016-10-27 DIAGNOSIS — F4323 Adjustment disorder with mixed anxiety and depressed mood: Secondary | ICD-10-CM | POA: Diagnosis not present

## 2016-11-07 ENCOUNTER — Encounter: Payer: BLUE CROSS/BLUE SHIELD | Admitting: Thoracic Surgery (Cardiothoracic Vascular Surgery)

## 2016-11-14 ENCOUNTER — Encounter: Payer: BLUE CROSS/BLUE SHIELD | Admitting: Thoracic Surgery (Cardiothoracic Vascular Surgery)

## 2016-11-16 ENCOUNTER — Ambulatory Visit: Payer: Self-pay | Admitting: Cardiovascular Disease

## 2016-11-16 ENCOUNTER — Other Ambulatory Visit (HOSPITAL_COMMUNITY): Payer: Self-pay

## 2016-11-22 ENCOUNTER — Encounter: Payer: Self-pay | Admitting: Thoracic Surgery (Cardiothoracic Vascular Surgery)

## 2016-11-22 ENCOUNTER — Institutional Professional Consult (permissible substitution) (INDEPENDENT_AMBULATORY_CARE_PROVIDER_SITE_OTHER): Payer: BLUE CROSS/BLUE SHIELD | Admitting: Thoracic Surgery (Cardiothoracic Vascular Surgery)

## 2016-11-22 VITALS — BP 136/82 | HR 66 | Resp 16 | Ht 72.0 in | Wt 175.0 lb

## 2016-11-22 DIAGNOSIS — T8209XD Other mechanical complication of heart valve prosthesis, subsequent encounter: Secondary | ICD-10-CM

## 2016-11-22 DIAGNOSIS — T8209XA Other mechanical complication of heart valve prosthesis, initial encounter: Secondary | ICD-10-CM | POA: Insufficient documentation

## 2016-11-22 DIAGNOSIS — I350 Nonrheumatic aortic (valve) stenosis: Secondary | ICD-10-CM

## 2016-11-22 DIAGNOSIS — T8203XD Leakage of heart valve prosthesis, subsequent encounter: Secondary | ICD-10-CM | POA: Diagnosis not present

## 2016-11-22 DIAGNOSIS — Z953 Presence of xenogenic heart valve: Secondary | ICD-10-CM | POA: Diagnosis not present

## 2016-11-22 DIAGNOSIS — I351 Nonrheumatic aortic (valve) insufficiency: Secondary | ICD-10-CM | POA: Diagnosis not present

## 2016-11-22 NOTE — Progress Notes (Signed)
[address]       [name] [address]             [phone]     CARDIOTHORACIC SURGERY CONSULTATION REPORT  Referring Provider is Wendall Stade, MD PCP is Lupe Carney, MD  Chief Complaint  Patient presents with  . Follow-up    rediscuss AVR...originally consulted in May 2016...ECHO 10/17/16    HPI:  Patient is a 60 year old gentleman with history of bicuspid aortic valve disease who underwent aortic valve replacement using a bioprosthetic tissue valve via right mini thoracotomy approach December 2010. The patient's valve was replaced using a 21 mm [facility] Ease bovine pericardial tissue valve. Early postoperatively the patient was noted to have a mild paravalvular leak and somewhat elevated transvalvular gradients across the aortic valve suggestive of mild patient-prosthesis mismatch. Peak velocity across the aortic valve was measured 3.7 m/sec at the time of his first post-operative echocardiogram performed in January 2011, corresponding to a mean transvalvular gradient of 29 mmHg. At that time left ventricular systolic function appeared normal with ejection fraction estimated 55%.  Since that time serial transthoracic echocardiograms have demonstrated a gradual decline in left ventricular systolic function with slowly rising transvalvular gradient and increased paravalvular leak.  Approximately 2 years ago he was referrred to [facility] to consider percutaneous treatment options for management of the patient's paravalvular leak but redo aortic valve replacement was recommended. The patient was referred back to our office for surgical consultation and I had the opportunity to see him in May 2016. At that time he remained essentially asymptomatic but he admitted that he was starting to appreciate a slight decline in his exercise tolerance.  Redo aortic valve replacement was recommended. The patient decided to hold off at that time and  has been followed intermittently ever since by Dr. Eden Emms. Recent follow-up echocardiogram revealed further decline in the patient's left ventricular systolic function and increased transvalvular gradient across the aortic valve. The patient returns to our office today for follow-up.  The patient is now separated from his wife who accompanies him for his office visit today. He admits that he has had a further decline in his exercise tolerance and he is no longer exercising his regularly or as vigorously as he used to in the past. He otherwise denies any significant problems with exertional shortness of breath, and he is quite comfortable with ordinary day-to-day activities. He has not had any chest pain or chest tightness either with activity or at rest. He has had occasional slight dizzy spells when he stands up from a seated position. He denies syncope. He denies any history of PND, orthopnea, or lower extremity edema. He complains of some problems related to stress and depression as well as some problems with short-term memory loss of unclear significance.  His appetite is stable but he has lost 4 or 5 pounds in weight. He denies any fevers or chills.  Past Medical History:  Diagnosis Date  . Aortic insufficiency   . Aortic valve disease    a. Biscuspid AVR with heavy annular calcification s/p tissue AVR (pt preferred). b. Mod-severe perivalvular regurg by cath 08/2013, mod by TEE 08/2013, gradients lower by TEE than by echo.   . Hearing loss    bilateral, pt wears hearing aids   . HTN (hypertension)   . Nonischemic cardiomyopathy (HCC)    a. EF normal immediately after AVR, then decreased to 40-45% by echo & 34%  by MRI 2011. b. Most recent EF 40-45% by echo/TEE 08/2013.  . Paravalvular leak of prosthetic heart valve   . Prosthetic valve dysfunction   . Reflux   . Rosacea   . S/P aortic valve replacement with bioprosthetic valve 12/08/2009   3mm [employer] Ease bovine pericardial tissue valve  placed via right mini thoracotomy approach    Past Surgical History:  Procedure Laterality Date  . AORTIC VALVE REPLACEMENT  12/08/2009   Procedure: Right miniature anterior thoracotomy for aortic valve replacement (32-mm [place] Ease pericardial tissue valve) Surgeon: [name], MD assistant: Kerin Perna, MD  . INGUINAL HERNIA REPAIR     Left  . INGUINAL HERNIA REPAIR  1960   right side  . LEFT AND RIGHT HEART CATHETERIZATION WITH CORONARY ANGIOGRAM N/A 09/19/2013   Procedure: LEFT AND RIGHT HEART CATHETERIZATION WITH CORONARY ANGIOGRAM;  Surgeon: Wendall Stade, MD;  Location: [facility] CATH LAB;  Service: Cardiovascular;  Laterality: N/A;  . TEE WITHOUT CARDIOVERSION N/A 09/19/2013   Procedure: TRANSESOPHAGEAL ECHOCARDIOGRAM (TEE);  Surgeon: Wendall Stade, MD;  Location: [facility] ENDOSCOPY;  Service: Cardiovascular;  Laterality: N/A;  . TOOTH EXTRACTION Left August 2014   left bottom molar    History reviewed. No pertinent family history.  Social History   Social History  . Marital status: Married    Spouse name: N/A  . Number of children: 2  . Years of education: N/A   Occupational History  . Business Engineer, manufacturing systems     Social History Main Topics  . Smoking status: Never Smoker  . Smokeless tobacco: Never Used  . Alcohol use 0.6 oz/week    1 Cans of beer per week     Comment: occasional  . Drug use: No  . Sexual activity: Not on file   Other Topics Concern  . Not on file   Social History Narrative   Exercises 4 days per week    Current Outpatient Prescriptions  Medication Sig Dispense Refill  . amoxicillin (AMOXIL) 500 MG tablet Take 2,000 mg by mouth once as needed (takes prior to dental surgeries).     . Omega-3 Fatty Acids (FISH OIL PO) Take 750 mg by mouth daily.     No current facility-administered medications for this visit.     No Known Allergies    Review of Systems:   General:  norma appetite, decreased energy, no  weight gain, + weight loss, no fever  Cardiac:  no chest pain with exertion, no chest pain at rest, + SOB with more strenuous exertion, no resting SOB, no PND, no orthopnea, no palpitations, no arrhythmia, no atrial fibrillation, no LE edema, occasional dizzy spells, no syncope  Respiratory:  no shortness of breath, no home oxygen, no productive cough, no dry cough, no bronchitis, no wheezing, no hemoptysis, no asthma, no pain with inspiration or cough, no sleep apnea, no CPAP at night  GI:   no difficulty swallowing, no reflux, no frequent heartburn, no hiatal hernia, no abdominal pain, no constipation, no diarrhea, no hematochezia, no hematemesis, no melena  GU:   no dysuria,  no frequency, no urinary tract infection, no hematuria, no enlarged prostate, no kidney stones, no kidney disease  Vascular:  no pain suggestive of claudication, no pain in feet, no leg cramps, no varicose veins, no DVT, no non-healing foot ulcer  Neuro:   no stroke, no TIA's, no seizures, no headaches, no temporary blindness one eye,  no slurred speech, no peripheral neuropathy,  no chronic pain, no instability of gait, + mild memory/cognitive dysfunction  Musculoskeletal: no arthritis, no joint swelling, no myalgias, no difficulty walking, normal mobility   Skin:   no rash, no itching, no skin infections, no pressure sores or ulcerations  Psych:   no anxiety, + depression, no nervousness, + unusual recent stress  Eyes:   no blurry vision, no floaters, no recent vision changes, + wears glasses or contacts  ENT:   + hearing loss, no loose or painful teeth, no dentures, last saw dentist 7/17  Hematologic:  no easy bruising, no abnormal bleeding, no clotting disorder, no frequent epistaxis  Endocrine:  no diabetes, does not check CBG's at home     Physical Exam:   BP 136/82 (BP Location: Left Arm, Patient Position: Sitting, Cuff Size: Normal)   Pulse 66   Resp 16   Ht 6' (1.829 m)   Wt 175 lb (79.4 kg)   SpO2 97% Comment:  ON RA  BMI 23.73 kg/m   General:  Thin,  well-appearing  HEENT:  Unremarkable   Neck:   no JVD, no bruits, no adenopathy   Chest:   clear to auscultation, symmetrical breath sounds, no wheezes, no rhonchi   CV:   RRR, grade III/VI systolic murmur   Abdomen:  soft, non-tender, no masses   Extremities:  warm, well-perfused, pulses palpable, no LE edema  Rectal/GU  Deferred  Neuro:   Grossly non-focal and symmetrical throughout  Skin:   Clean and dry, no rashes, no breakdown   Diagnostic Tests:  Transthoracic Echocardiography  Patient:    Takeem, Krotzer MR #:       [id number] Study Date: 10/17/2016 Gender:     M Age:        13 Height:     182.9 cm Weight:     82.6 kg BSA:        2.05 m^2 Pt. Status: Room:   ATTENDING    [name], M.D.  ORDERING     [name], M.D.  REFERRING    [name], M.D.  SONOGRAPHER  Randa Evens, Will  PERFORMING   [facility], Outpatient  REFERRING    Chilton Si, MD  cc:  ------------------------------------------------------------------- LV EF: 30% -   35%  ------------------------------------------------------------------- Indications:      (Z95.2).  ------------------------------------------------------------------- History:   PMH:  S/p AVR (bioprosthetic). Dilated cardiomyopathy. AI. Acquired from the patient and from the patient&'s chart. Congestive heart failure.  ------------------------------------------------------------------- Study Conclusions  - Left ventricle: The cavity size was mildly dilated. There was   mild concentric hypertrophy. Systolic function was moderately to   severely reduced. The estimated ejection fraction was in the   range of 30% to 35%. Akinesis of the anteroseptal myocardium and   hypokinesis of the inferoseptum. Doppler parameters are   consistent with a reversible restrictive pattern, indicative of   decreased left ventricular diastolic compliance and/or increased   left atrial pressure  (grade 3 diastolic dysfunction). Doppler   parameters are consistent with high ventricular filling pressure. - Aortic valve: A bioprosthesis was present. Transvalvular   gradients are severely elevated, slightly higher than when last   checked 11/2014. There was moderate perivalvular regurgitation.   Peak velocity (S): 443 cm/s. Mean gradient (S): 45 mm Hg. - Mitral valve: Transvalvular velocity was within the normal range.   There was no evidence for stenosis. There was mild regurgitation. - Left atrium: The atrium was severely dilated. - Right ventricle: The cavity size was normal. Wall thickness  was   normal. Systolic function was normal. - Atrial septum: No defect or patent foramen ovale was identified   by color flow Doppler. - Tricuspid valve: There was trivial regurgitation. - Pulmonary arteries: Systolic pressure was at the upper limits of   normal. PA peak pressure: 37 mm Hg (S).  Impressions:  - Compared with echo 11/2014, mean gradient across the aortic valve   has increased from 39 mmHg to 45 mmHg.  ------------------------------------------------------------------- Study data:  Comparison was made to the study of 11/26/2014.  Study status:  Routine.  Procedure:  The patient reported no pain pre or post test. Transthoracic echocardiography for left ventricular function evaluation and for assessment of valvular function. Image quality was adequate.  Study completion:  There were no complications.          Transthoracic echocardiography.  M-mode, complete 2D, spectral Doppler, and color Doppler.  Birthdate: Patient birthdate: 10/16/1956.  Age:  Patient is 60 yr old.  Sex: Gender: male.    BMI: 24.7 kg/m^2.  Blood pressure:     120/88 Patient status:  Outpatient.  Study date:  Study date: 10/17/2016. Study time: 04:18 PM.  Location:  [facility]  3  -------------------------------------------------------------------  ------------------------------------------------------------------- Left ventricle:  The cavity size was mildly dilated. There was mild concentric hypertrophy. Systolic function was moderately to severely reduced. The estimated ejection fraction was in the range of 30% to 35%.  Regional wall motion abnormalities:  Akinesis of the anteroseptal myocardium and hypokinesis of the inferoseptum. Doppler parameters are consistent with a reversible restrictive pattern, indicative of decreased left ventricular diastolic compliance and/or increased left atrial pressure (grade 3 diastolic dysfunction). Doppler parameters are consistent with high ventricular filling pressure.  ------------------------------------------------------------------- Aortic valve:  Poorly visualized. A bioprosthesis was present. Doppler:  Transvalvular gradients are severely elevated, slightly higher than when last checked 11/2014. There was moderate perivalvular regurgitation.    VTI ratio of LVOT to aortic valve: 0.14. Valve area (VTI): 0.54 cm^2. Indexed valve area (VTI): 0.26 cm^2/m^2. Peak velocity ratio of LVOT to aortic valve: 0.14. Valve area (Vmax): 0.55 cm^2. Indexed valve area (Vmax): 0.27 cm^2/m^2. Mean velocity ratio of LVOT to aortic valve: 0.15. Valve area (Vmean): 0.58 cm^2. Indexed valve area (Vmean): 0.28 cm^2/m^2. Mean gradient (S): 45 mm Hg. Peak gradient (S): 78 mm Hg.  ------------------------------------------------------------------- Aorta:  Aortic root: The aortic root was normal in size.  ------------------------------------------------------------------- Mitral valve:   Structurally normal valve.   Mobility was not restricted.  Doppler:  Transvalvular velocity was within the normal range. There was no evidence for stenosis. There was mild regurgitation.    Peak gradient (D): 3 mm  Hg.  ------------------------------------------------------------------- Left atrium:  The atrium was severely dilated.  ------------------------------------------------------------------- Atrial septum:  No defect or patent foramen ovale was identified by color flow Doppler.  ------------------------------------------------------------------- Right ventricle:  The cavity size was normal. Wall thickness was normal. Systolic function was normal.  ------------------------------------------------------------------- Pulmonic valve:    Structurally normal valve.   Cusp separation was normal.  Doppler:  Transvalvular velocity was within the normal range. There was no evidence for stenosis. There was no regurgitation.  ------------------------------------------------------------------- Tricuspid valve:   Structurally normal valve.    Doppler: Transvalvular velocity was within the normal range. There was trivial regurgitation.  ------------------------------------------------------------------- Pulmonary artery:   The main pulmonary artery was normal-sized. Systolic pressure was at the upper limits of normal.  ------------------------------------------------------------------- Right atrium:  The atrium was normal in size.  ------------------------------------------------------------------- Pericardium:  There was no pericardial  effusion.  ------------------------------------------------------------------- Systemic veins: Inferior vena cava: The vessel was dilated. The respirophasic diameter changes were in the normal range (>= 50%), consistent with elevated central venous pressure.  ------------------------------------------------------------------- Measurements   Left ventricle                            Value          Reference  LV ID, ED, PLAX chordal           (H)     54.4  mm       43 - 52  LV ID, ES, PLAX chordal           (H)     45.3  mm       23 - 38  LV fx  shortening, PLAX chordal    (L)     17    %        >=29  LV PW thickness, ED                       12.6  mm       ---------  IVS/LV PW ratio, ED                       0.89           <=1.3  Stroke volume, 2D                         58    ml       ---------  Stroke volume/bsa, 2D                     28    ml/m^2   ---------  LV ejection fraction, 1-p A4C             40    %        ---------  LV end-diastolic volume, 2-p              136   ml       ---------  LV end-systolic volume, 2-p               87    ml       ---------  LV ejection fraction, 2-p                 36    %        ---------  Stroke volume, 2-p                        49    ml       ---------  LV end-diastolic volume/bsa, 2-p          66    ml/m^2   ---------  LV end-systolic volume/bsa, 2-p           42    ml/m^2   ---------  Stroke volume/bsa, 2-p                    23.9  ml/m^2   ---------  LV e&', lateral                            7.9   cm/s     ---------  LV E/e&', lateral  10.06          ---------  LV e&', medial                             4     cm/s     ---------  LV E/e&', medial                           19.88          ---------  LV e&', average                            5.95  cm/s     ---------  LV E/e&', average                          13.36          ---------    Ventricular septum                        Value          Reference  IVS thickness, ED                         11.2  mm       ---------    LVOT                                      Value          Reference  LVOT ID, S                                22    mm       ---------  LVOT area                                 3.8   cm^2     ---------  LVOT ID                                   22    mm       ---------  LVOT peak velocity, S                     63.9  cm/s     ---------  LVOT mean velocity, S                     48.4  cm/s     ---------  LVOT VTI, S                               15.3  cm       ---------  LVOT peak gradient,  S                     2     mm Hg    ---------  Stroke volume (SV), LVOT DP  58.2  ml       ---------  Stroke index (SV/bsa), LVOT DP            28.3  ml/m^2   ---------    Aortic valve                              Value          Reference  Aortic valve peak velocity, S             443   cm/s     ---------  Aortic valve mean velocity, S             319   cm/s     ---------  Aortic valve VTI, S                       107   cm       ---------  Aortic mean gradient, S                   45    mm Hg    ---------  Aortic peak gradient, S                   78    mm Hg    ---------  VTI ratio, LVOT/AV                        0.14           ---------  Aortic valve area, VTI                    0.54  cm^2     ---------  Aortic valve area/bsa, VTI                0.26  cm^2/m^2 ---------  Velocity ratio, peak, LVOT/AV             0.14           ---------  Aortic valve area, peak velocity          0.55  cm^2     ---------  Aortic valve area/bsa, peak               0.27  cm^2/m^2 ---------  velocity  Velocity ratio, mean, LVOT/AV             0.15           ---------  Aortic valve area, mean velocity          0.58  cm^2     ---------  Aortic valve area/bsa, mean               0.28  cm^2/m^2 ---------  velocity  Aortic regurg pressure half-time          466   ms       ---------    Aorta                                     Value          Reference  Aortic root ID, ED                        33    mm       ---------  Ascending aorta ID, A-P, S                34    mm       ---------    Left atrium                               Value          Reference  LA ID, A-P, ES                            42    mm       ---------  LA ID/bsa, A-P                            2.05  cm/m^2   <=2.2  LA volume, S                              87    ml       ---------  LA volume/bsa, S                          42.4  ml/m^2   ---------  LA volume, ES, 1-p A4C                    94    ml       ---------  LA  volume/bsa, ES, 1-p A4C                45.8  ml/m^2   ---------  LA volume, ES, 1-p A2C                    76    ml       ---------  LA volume/bsa, ES, 1-p A2C                37    ml/m^2   ---------    Mitral valve                              Value          Reference  Mitral E-wave peak velocity               79.5  cm/s     ---------  Mitral A-wave peak velocity               34.6  cm/s     ---------  Mitral deceleration time          (H)     236   ms       150 - 230  Mitral peak gradient, D                   3     mm Hg    ---------  Mitral E/A ratio, peak                    2.3            ---------    Pulmonary arteries                        Value  Reference  PA pressure, S, DP                (H)     37    mm Hg    <=30    Tricuspid valve                           Value          Reference  Tricuspid regurg peak velocity            271   cm/s     ---------  Tricuspid peak RV-RA gradient             29    mm Hg    ---------    Systemic veins                            Value          Reference  Estimated CVP                             8     mm Hg    ---------    Right ventricle                           Value          Reference  RV pressure, S, DP                (H)     37    mm Hg    <=30  RV s&', lateral, S                         11.1  cm/s     ---------  Legend: (L)  and  (H)  mark values outside specified reference range.  ------------------------------------------------------------------- Prepared and Electronically Authenticated by  Chilton Si, MD 2017-10-23T22:03:06   Impression:  Patient has a prosthetic valve dysfunction with stage D severe symptomatic aortic stenosis and paravalvular leak associated with moderate aortic insufficiency. He describes mild but progressive symptoms of exertional shortness of breath and fatigue consistent with chronic combined systolic and diastolic congestive heart failure, New York Heart Association functional class  I-II.  I have personally reviewed the patient's recent follow-up transthoracic echocardiogram. Peak velocity across the aortic valve now measures 4.4 m/s corresponding to mean transvalvular gradient estimated 45 mmHg. Left ventricular systolic function has declined further with ejection fraction estimated at only 30-35%. There remains moderate paravalvular leak.   Risks associated with surgery will be considerable, but the patient remains otherwise healthy and free of comorbid medical problems.  I recommend redo aortic valve replacement. I favor use of a low profile bileaflet mechanical prosthetic valve.   Plan:  I spent in excess of an hour with the patient and his wife discussing his current clinical condition, results of his recent echocardiogram, options for management, and expectations for his convalescence following surgery.  All of their questions have been addressed. The patient seems inclined to proceed with surgery at some point in the near future, but he remains reluctant at this time and wants to think matters over further before making a final decision. Prior to surgery the patient will need to undergo left and right heart catheterization. In addition, cardiac gated CT angiogram of the heart  may be helpful to further evaluate the anatomy of the aortic root including the proximity of the left main and right coronary artery to the existing aortic valve and aortic annulus. We will hold off on ordering a CT angiogram or making arrangements for diagnostic cardiac catheterization until the patient is ready to proceed with surgery.  In the meanwhile the patient will continue to follow-up with Dr. Eden Emms.   I spent in excess of 90 minutes during the conduct of this office consultation and >50% of this time involved direct face-to-face encounter with the patient for counseling and/or coordination of their care.   [name], MD 11/22/2016 6:08 PM

## 2016-12-07 ENCOUNTER — Other Ambulatory Visit: Payer: Self-pay | Admitting: Family Medicine

## 2016-12-07 DIAGNOSIS — F320 Major depressive disorder, single episode, mild: Secondary | ICD-10-CM | POA: Diagnosis not present

## 2016-12-07 DIAGNOSIS — R413 Other amnesia: Secondary | ICD-10-CM | POA: Diagnosis not present

## 2016-12-09 ENCOUNTER — Telehealth: Payer: Self-pay

## 2016-12-09 ENCOUNTER — Other Ambulatory Visit: Payer: Self-pay | Admitting: *Deleted

## 2016-12-09 DIAGNOSIS — Z01812 Encounter for preprocedural laboratory examination: Secondary | ICD-10-CM

## 2016-12-09 DIAGNOSIS — I350 Nonrheumatic aortic (valve) stenosis: Secondary | ICD-10-CM

## 2016-12-09 DIAGNOSIS — I351 Nonrheumatic aortic (valve) insufficiency: Secondary | ICD-10-CM

## 2016-12-09 NOTE — Telephone Encounter (Signed)
Left message for patient to call back  

## 2016-12-09 NOTE — Telephone Encounter (Signed)
Left message for patient to call back.  Per Dr. Eden Emms, patient needs right and left heart cath with aortic angiogram in January, before up coming surgery with Dr. Cornelius Moras in February.

## 2016-12-09 NOTE — Telephone Encounter (Signed)
Called patient back. Patient will have heart cath on 01/06/17 at 10:30 am with Dr. Excell Seltzer. Will send patien'st instruction sheet to patient. Patient will have lab work before procedure. Patient stated it would be fine to leave message on his voicemail.

## 2016-12-09 NOTE — Telephone Encounter (Signed)
[name] is returning your call . Thanks

## 2016-12-10 ENCOUNTER — Other Ambulatory Visit: Payer: Self-pay | Admitting: Cardiovascular Disease

## 2016-12-10 DIAGNOSIS — I350 Nonrheumatic aortic (valve) stenosis: Secondary | ICD-10-CM

## 2016-12-12 ENCOUNTER — Other Ambulatory Visit: Payer: Self-pay | Admitting: *Deleted

## 2016-12-12 DIAGNOSIS — I350 Nonrheumatic aortic (valve) stenosis: Secondary | ICD-10-CM

## 2016-12-13 NOTE — Telephone Encounter (Signed)
81 mg asa fine

## 2016-12-13 NOTE — Telephone Encounter (Signed)
Called patient with instructions for heart cath. Patient will have lab work done 12/30/16, a week prior to heart cath. Patient is currently not on any ASA/antiplatlet. Will forward to Dr. Eden Emms to see if patient needs to start aspirin.

## 2016-12-14 MED ORDER — ASPIRIN EC 81 MG PO TBEC: 81.0000 mg | DELAYED_RELEASE_TABLET | Freq: Every day | ORAL | 3 refills | Status: AC

## 2016-12-14 NOTE — Telephone Encounter (Signed)
Called patient to let him know to start Aspirin 81 mg by mouth daily. Patient verbalized understanding.

## 2016-12-21 ENCOUNTER — Ambulatory Visit
Admission: RE | Admit: 2016-12-21 | Discharge: 2016-12-21 | Disposition: A | Discharge status: Home / Self Care | Payer: BLUE CROSS/BLUE SHIELD | Source: Ambulatory Visit | Attending: Family Medicine | Admitting: Family Medicine

## 2016-12-21 DIAGNOSIS — R413 Other amnesia: Secondary | ICD-10-CM

## 2016-12-21 MED ORDER — GADOBENATE DIMEGLUMINE 529 MG/ML IV SOLN
15.0000 mL | Freq: Once | INTRAVENOUS | Status: AC | PRN
  Administered 2016-12-21: 15 mL via INTRAVENOUS

## 2016-12-30 ENCOUNTER — Ambulatory Visit (HOSPITAL_COMMUNITY)
Admission: RE | Admit: 2016-12-30 | Discharge: 2016-12-30 | Disposition: A | Discharge status: Home / Self Care | Payer: BLUE CROSS/BLUE SHIELD | Source: Ambulatory Visit | Attending: Thoracic Surgery (Cardiothoracic Vascular Surgery) | Admitting: Thoracic Surgery (Cardiothoracic Vascular Surgery)

## 2016-12-30 ENCOUNTER — Encounter (HOSPITAL_COMMUNITY): Payer: Self-pay

## 2016-12-30 ENCOUNTER — Other Ambulatory Visit: Payer: BLUE CROSS/BLUE SHIELD | Admitting: *Deleted

## 2016-12-30 DIAGNOSIS — I350 Nonrheumatic aortic (valve) stenosis: Secondary | ICD-10-CM

## 2016-12-30 DIAGNOSIS — Z01812 Encounter for preprocedural laboratory examination: Secondary | ICD-10-CM | POA: Diagnosis not present

## 2016-12-30 LAB — PROTIME-INR: INR: 1 (ref 0.9–1.1)

## 2016-12-30 MED ORDER — METOPROLOL TARTRATE 5 MG/5ML IV SOLN
5.0000 mg | INTRAVENOUS | Status: DC | PRN
  Administered 2016-12-30 (×2): 5 mg via INTRAVENOUS

## 2016-12-30 MED ORDER — IOPAMIDOL (ISOVUE-370) INJECTION 76%
INTRAVENOUS | Status: AC
  Administered 2016-12-30: 80 mL
  Filled 2016-12-30: qty 100

## 2016-12-30 MED ORDER — METOPROLOL TARTRATE 5 MG/5ML IV SOLN
INTRAVENOUS | Status: AC
  Administered 2016-12-30: 5 mg via INTRAVENOUS
  Filled 2016-12-30: qty 15

## 2016-12-30 NOTE — Progress Notes (Signed)
Bmp

## 2017-01-03 ENCOUNTER — Encounter (HOSPITAL_COMMUNITY): Payer: Self-pay | Admitting: *Deleted

## 2017-01-04 ENCOUNTER — Ambulatory Visit (HOSPITAL_COMMUNITY)
Admission: RE | Admit: 2017-01-04 | Discharge: 2017-01-04 | Disposition: A | Discharge status: Home / Self Care | Payer: BLUE CROSS/BLUE SHIELD | Source: Ambulatory Visit | Attending: Cardiovascular Disease | Admitting: Cardiovascular Disease

## 2017-01-04 ENCOUNTER — Encounter (HOSPITAL_COMMUNITY)
Admission: RE | Disposition: A | Discharge status: Home / Self Care | Payer: Self-pay | Source: Ambulatory Visit | Attending: Cardiovascular Disease

## 2017-01-04 ENCOUNTER — Encounter (HOSPITAL_COMMUNITY): Payer: Self-pay | Admitting: Interventional Cardiology

## 2017-01-04 DIAGNOSIS — I2510 Atherosclerotic heart disease of native coronary artery without angina pectoris: Secondary | ICD-10-CM

## 2017-01-04 DIAGNOSIS — I5040 Unspecified combined systolic (congestive) and diastolic (congestive) heart failure: Secondary | ICD-10-CM

## 2017-01-04 DIAGNOSIS — I350 Nonrheumatic aortic (valve) stenosis: Secondary | ICD-10-CM

## 2017-01-04 DIAGNOSIS — L719 Rosacea, unspecified: Secondary | ICD-10-CM | POA: Diagnosis not present

## 2017-01-04 DIAGNOSIS — I5022 Chronic systolic (congestive) heart failure: Secondary | ICD-10-CM | POA: Insufficient documentation

## 2017-01-04 DIAGNOSIS — I110 Hypertensive heart disease with heart failure: Secondary | ICD-10-CM | POA: Diagnosis not present

## 2017-01-04 DIAGNOSIS — Z7982 Long term (current) use of aspirin: Secondary | ICD-10-CM | POA: Diagnosis not present

## 2017-01-04 DIAGNOSIS — T82897D Other specified complication of cardiac prosthetic devices, implants and grafts, subsequent encounter: Secondary | ICD-10-CM | POA: Diagnosis not present

## 2017-01-04 DIAGNOSIS — I428 Other cardiomyopathies: Secondary | ICD-10-CM | POA: Diagnosis not present

## 2017-01-04 DIAGNOSIS — Y713 Surgical instruments, materials and cardiovascular devices (including sutures) associated with adverse incidents: Secondary | ICD-10-CM | POA: Diagnosis not present

## 2017-01-04 DIAGNOSIS — K219 Gastro-esophageal reflux disease without esophagitis: Secondary | ICD-10-CM | POA: Diagnosis not present

## 2017-01-04 DIAGNOSIS — I351 Nonrheumatic aortic (valve) insufficiency: Secondary | ICD-10-CM | POA: Diagnosis not present

## 2017-01-04 DIAGNOSIS — T8203XA Leakage of heart valve prosthesis, initial encounter: Secondary | ICD-10-CM | POA: Diagnosis present

## 2017-01-04 DIAGNOSIS — Q245 Malformation of coronary vessels: Secondary | ICD-10-CM | POA: Diagnosis not present

## 2017-01-04 DIAGNOSIS — Z952 Presence of prosthetic heart valve: Secondary | ICD-10-CM

## 2017-01-04 HISTORY — PX: CARDIAC CATHETERIZATION: SHX172

## 2017-01-04 LAB — POCT I-STAT 3, ART BLOOD GAS (G3+)
Bicarbonate: 24.9 mmol/L (ref 20.0–28.0)
O2 SAT: 96 %
PCO2 ART: 42.7 mmHg (ref 32.0–48.0)
PO2 ART: 86 mmHg (ref 83.0–108.0)
TCO2: 26 mmol/L (ref 0–100)
pH, Arterial: 7.375 (ref 7.350–7.450)

## 2017-01-04 LAB — POCT I-STAT 3, VENOUS BLOOD GAS (G3P V)
Acid-Base Excess: 1 mmol/L (ref 0.0–2.0)
BICARBONATE: 26.3 mmol/L (ref 20.0–28.0)
O2 Saturation: 68 %
PCO2 VEN: 44.6 mmHg (ref 44.0–60.0)
PH VEN: 7.378 (ref 7.250–7.430)
TCO2: 28 mmol/L (ref 0–100)
pO2, Ven: 36 mmHg (ref 32.0–45.0)

## 2017-01-04 LAB — PROTIME-INR
INR: 1.02
Prothrombin Time: 13.5 seconds (ref 11.4–15.2)

## 2017-01-04 SURGERY — RIGHT/LEFT HEART CATH AND CORONARY ANGIOGRAPHY
Anesthesia: LOCAL

## 2017-01-04 MED ORDER — OXYCODONE-ACETAMINOPHEN 5-325 MG PO TABS: 1.0000 | ORAL_TABLET | ORAL | Status: DC | PRN

## 2017-01-04 MED ORDER — ONDANSETRON HCL 4 MG/2ML IJ SOLN: 4.0000 mg | Freq: Four times a day (QID) | INTRAMUSCULAR | Status: DC | PRN

## 2017-01-04 MED ORDER — HEPARIN SODIUM (PORCINE) 1000 UNIT/ML IJ SOLN
INTRAMUSCULAR | Status: AC
Start: 2017-01-04 — End: 2017-01-04
  Filled 2017-01-04: qty 1

## 2017-01-04 MED ORDER — MIDAZOLAM HCL 2 MG/2ML IJ SOLN
INTRAMUSCULAR | Status: AC
  Filled 2017-01-04: qty 2

## 2017-01-04 MED ORDER — FENTANYL CITRATE (PF) 100 MCG/2ML IJ SOLN
INTRAMUSCULAR | Status: AC
  Filled 2017-01-04: qty 2

## 2017-01-04 MED ORDER — HEPARIN (PORCINE) IN NACL 2-0.9 UNIT/ML-% IJ SOLN
INTRAMUSCULAR | Status: DC | PRN
Start: 2017-01-04 — End: 2017-01-04
  Administered 2017-01-04: 1000 mL

## 2017-01-04 MED ORDER — HEPARIN (PORCINE) IN NACL 2-0.9 UNIT/ML-% IJ SOLN
INTRAMUSCULAR | Status: AC
  Filled 2017-01-04: qty 500

## 2017-01-04 MED ORDER — VERAPAMIL HCL 2.5 MG/ML IV SOLN
INTRAVENOUS | Status: AC
  Filled 2017-01-04: qty 2

## 2017-01-04 MED ORDER — SODIUM CHLORIDE 0.9% FLUSH: 3.0000 mL | Freq: Two times a day (BID) | INTRAVENOUS | Status: DC

## 2017-01-04 MED ORDER — SODIUM CHLORIDE 0.9 % IV SOLN: 250.0000 mL | INTRAVENOUS | Status: DC | PRN

## 2017-01-04 MED ORDER — MIDAZOLAM HCL 2 MG/2ML IJ SOLN
INTRAMUSCULAR | Status: DC | PRN
  Administered 2017-01-04 (×3): 1 mg via INTRAVENOUS

## 2017-01-04 MED ORDER — VERAPAMIL HCL 2.5 MG/ML IV SOLN
INTRAVENOUS | Status: DC | PRN
  Administered 2017-01-04: 10 mL via INTRA_ARTERIAL

## 2017-01-04 MED ORDER — SODIUM CHLORIDE 0.9 % WEIGHT BASED INFUSION: 1.0000 mL/kg/h | INTRAVENOUS | Status: DC

## 2017-01-04 MED ORDER — SODIUM CHLORIDE 0.9% FLUSH: 3.0000 mL | INTRAVENOUS | Status: DC | PRN

## 2017-01-04 MED ORDER — ACETAMINOPHEN 325 MG PO TABS: 650.0000 mg | ORAL_TABLET | ORAL | Status: DC | PRN

## 2017-01-04 MED ORDER — HEPARIN SODIUM (PORCINE) 1000 UNIT/ML IJ SOLN
INTRAMUSCULAR | Status: DC | PRN
  Administered 2017-01-04: 4000 [IU] via INTRAVENOUS

## 2017-01-04 MED ORDER — IOPAMIDOL (ISOVUE-370) INJECTION 76%
INTRAVENOUS | Status: DC | PRN
  Administered 2017-01-04: 120 mL via INTRAVENOUS

## 2017-01-04 MED ORDER — IOPAMIDOL (ISOVUE-370) INJECTION 76%
INTRAVENOUS | Status: AC
  Filled 2017-01-04: qty 100

## 2017-01-04 MED ORDER — ASPIRIN 81 MG PO CHEW: 81.0000 mg | CHEWABLE_TABLET | ORAL | Status: DC

## 2017-01-04 MED ORDER — SODIUM CHLORIDE 0.9 % IV SOLN: INTRAVENOUS | Status: AC

## 2017-01-04 MED ORDER — SODIUM CHLORIDE 0.9 % WEIGHT BASED INFUSION
3.0000 mL/kg/h | INTRAVENOUS | Status: DC
  Administered 2017-01-04: 3 mL/kg/h via INTRAVENOUS

## 2017-01-04 MED ORDER — FENTANYL CITRATE (PF) 100 MCG/2ML IJ SOLN
INTRAMUSCULAR | Status: DC | PRN
  Administered 2017-01-04: 50 ug via INTRAVENOUS

## 2017-01-04 MED ORDER — LIDOCAINE HCL (PF) 1 % IJ SOLN
INTRAMUSCULAR | Status: DC | PRN
  Administered 2017-01-04: 5 mL

## 2017-01-04 SURGICAL SUPPLY — 15 items
CATH BALLN WEDGE 5F 110CM (CATHETERS) ×1 IMPLANT
CATH EXPO 5F FL3.5 (CATHETERS) ×1 IMPLANT
CATH EXPO 5FR ANG PIGTAIL 145 (CATHETERS) ×1 IMPLANT
CATH INFINITI JR4 5F (CATHETERS) ×1 IMPLANT
DEVICE RAD COMP TR BAND LRG (VASCULAR PRODUCTS) ×1 IMPLANT
GLIDESHEATH SLEND A-KIT 6F 22G (SHEATH) ×1 IMPLANT
GUIDEWIRE INQWIRE 1.5J.035X260 (WIRE) IMPLANT
INQWIRE 1.5J .035X260CM (WIRE) ×2
KIT HEART LEFT (KITS) ×2 IMPLANT
PACK CARDIAC CATHETERIZATION (CUSTOM PROCEDURE TRAY) ×2 IMPLANT
SHEATH FAST CATH BRACH 5F 5CM (SHEATH) ×1 IMPLANT
SYR MEDRAD MARK V 150ML (SYRINGE) ×1 IMPLANT
TRANSDUCER W/STOPCOCK (MISCELLANEOUS) ×3 IMPLANT
TUBING CIL FLEX 10 FLL-RA (TUBING) ×2 IMPLANT
WIRE EMERALD ST .035X150CM (WIRE) ×1 IMPLANT

## 2017-01-04 NOTE — Progress Notes (Signed)
Site area: rt ac venous sheath Site Prior to Removal:  Level 0 Pressure Applied For: 20 minutes Manual:   yes Patient Status During Pull:  stable Post Pull Site:  Level 0 Post Pull Instructions Given:  yes Post Pull Pulses Present: yes Dressing Applied:  yes Bedrest begins @ 1040 Comments:

## 2017-01-04 NOTE — Discharge Instructions (Signed)

## 2017-01-04 NOTE — H&P (Signed)
[name] is a 61 y.o. male  Admit Date: 01/04/2017 Referring Physician: Tressie Stalker, M.D. Primary Cardiologist: [name], MD Chief complaint / reason for admission: Dysfunction of bioprosthetic aortic valve  HPI: 61 year old gentleman with history of aortic valve replacement by minithoracotomy in December 2010 using a bioprosthetic 32 mm Magna Ease Edwards bovine valve. Postoperatively he developed a pattern valvular leak. He has done relatively well over the ensuing 7 years I have her recently there has been a decline in his baseline ejection fraction of 45% to 30-35% by echocardiography. On echocardiography he is noted to have stenosis and moderate perivalvular aortic regurgitation. Symptomatically, he has exertional fatigue and mild dyspnea. No complaints at rest. He denies chest pain. He has not had syncope.  He presents today for left and right heart catheterization as a prelude to [facility] where he tells me a mechanical valve will be used.  PMH:    Past Medical History:  Diagnosis Date  . Aortic insufficiency   . Aortic valve disease    a. Biscuspid AVR with heavy annular calcification s/p tissue AVR (pt preferred). b. Mod-severe perivalvular regurg by cath 08/2013, mod by TEE 08/2013, gradients lower by TEE than by echo.   . Hearing loss    bilateral, pt wears hearing aids   . HTN (hypertension)   . Nonischemic cardiomyopathy (HCC)    a. EF normal immediately after AVR, then decreased to 40-45% by echo & 34% by MRI 2011. b. Most recent EF 40-45% by echo/TEE 08/2013.  . Paravalvular leak of prosthetic heart valve   . Prosthetic valve dysfunction   . Reflux   . Rosacea   . S/P aortic valve replacement with bioprosthetic valve 12/08/2009   50mm Edwards Magna Ease bovine pericardial tissue valve placed via right mini thoracotomy approach    PSH:    Past Surgical History:  Procedure Laterality Date  . AORTIC VALVE REPLACEMENT  12/08/2009   Procedure: Right miniature  anterior thoracotomy for aortic valve replacement (32-mm [facility] Ease pericardial tissue valve) Surgeon: [name], MD assistant: Kerin Perna, MD  . INGUINAL HERNIA REPAIR     Left  . INGUINAL HERNIA REPAIR  1960   right side  . LEFT AND RIGHT HEART CATHETERIZATION WITH CORONARY ANGIOGRAM N/A 09/19/2013   Procedure: LEFT AND RIGHT HEART CATHETERIZATION WITH CORONARY ANGIOGRAM;  Surgeon: Wendall Stade, MD;  Location: [facility] CATH LAB;  Service: Cardiovascular;  Laterality: N/A;  . TEE WITHOUT CARDIOVERSION N/A 09/19/2013   Procedure: TRANSESOPHAGEAL ECHOCARDIOGRAM (TEE);  Surgeon: Wendall Stade, MD;  Location: [facility] ENDOSCOPY;  Service: Cardiovascular;  Laterality: N/A;  . TOOTH EXTRACTION Left August 2014   left bottom molar   ALLERGIES:   Patient has no known allergies. Prior to Admit Meds:   Prescriptions Prior to Admission  Medication Sig Dispense Refill Last Dose  . aspirin EC 81 MG tablet Take 1 tablet (81 mg total) by mouth daily. 90 tablet 3 01/04/2017 at 0600  . citalopram (CELEXA) 10 MG tablet Take 10 mg by mouth daily.  12 01/04/2017 at 0600  . Multiple Vitamin (MULTIVITAMIN WITH MINERALS) TABS tablet Take 1 tablet by mouth daily.   01/04/2017 at 0600  . Omega-3 Fatty Acids (FISH OIL PO) Take 1 capsule by mouth daily.    01/03/2017 at Unknown time  . Phosphatidylserine 100 MG CAPS Take 100 mg by mouth daily.   01/03/2017 at Unknown time  . amoxicillin (AMOXIL) 500 MG tablet Take 2,000 mg by  mouth once as needed (takes prior to dental surgeries).    More than a month at Unknown time   Family HX:   No family history on file. Social HX:    Social History   Social History  . Marital status: Married    Spouse name: N/A  . Number of children: 2  . Years of education: N/A   Occupational History  . Business Engineer, manufacturing systems     Social History Main Topics  . Smoking status: Never Smoker  . Smokeless tobacco: Never Used  . Alcohol use 0.6 oz/week    1  Cans of beer per week     Comment: occasional  . Drug use: No  . Sexual activity: Not on file   Other Topics Concern  . Not on file   Social History Narrative   Exercises 4 days per week     ROS Low energy. Easy bruising. He has no neurological complaints. He denies chills and fever. No history of stroke or neurological complaints. He has occasional headache. He has occasional palpitation but no prolonged or sustained arrhythmia. All other systems are negative.  Physical Exam: Blood pressure (!) 143/83, pulse 62, temperature 97.7 F (36.5 C), resp. rate 18, height 6' (1.829 m), weight 179 lb (81.2 kg), SpO2 99 %.    Healthy-appearing middle-aged Caucasian gentleman in no acute distress being examined in the same day outpatient area prior to catheterization. Skin is clear. HEENT exam reveals no jaundice or pallor of the conjunctiva. Neck exam reveals no JVD or carotid bruits. Carotid upstroke is 2+. Transmitted bruits are heard in both carotids. Chest is clear to auscultation and percussion. Cardiac exam reveals a 3/6 crescendo decrescendo murmur. Perhaps a faint mid to late diastolic murmurs heard with the patient lying supine. Auscultation was not performed with the patient sitting. Abdomen is soft. Bowel sounds are normal. Liver is not palpable. Extremities reveal no edema. Pedal pulses are 2+ and symmetric. Radial pulses are 2+ and symmetric. Carotid upstroke is 2+ and symmetric.  Neurological exam reveals no motor sensory deficits. Somewhat flat affect.  Labs: Lab Results  Component Value Date   WBC 9.2 09/20/2013   HGB 15.6 09/20/2013   HCT 44.6 09/20/2013   MCV 86.1 09/20/2013   PLT 151 09/20/2013   No results for input(s): NA, K, CL, CO2, BUN, CREATININE, CALCIUM, PROT, BILITOT, ALKPHOS, ALT, AST, GLUCOSE in the last 168 hours.  Invalid input(s): LABALBU No results found for: CKTOTAL, CKMB, CKMBINDEX, TROPONINI    Radiology:   Coronary CTA 12/30/16: IMPRESSION: 1) 21  mm Edwards Magna Ease tissue valve with non calcified restrictive leaflet motion? Fused non/right coronary cusps  2) Normal right dominant coronary arteries  3) Normal aortic root see dimensions above  4) Pseudocoarctation of distal aortic arch  5) Normal take off of great vessels from the arch  6) Coronary ostium very low lying within the existing tissue AVR  EKG:  Performed on 01/04/17 reveals poor R-wave progression V1 through V3, incomplete right bundle, and anterolateral precordial repolarization abnormality.   ASSESSMENT:  1. History of bicuspid aortic valve. Status post aortic valve replacement via mini thoracotomy approach 2010. Subsequent development of paravalvular leak and valvular dysfunction with echo now demonstrating evidence of both obstruction and moderate aortic regurgitation associated with decrease in LV function and exertional fatigue. He has been seen by cardiac surgery and is planning on having surgical aortic valve replacement by conventional approach.  2. As noted above the recent coronary  CTA demonstrated essentially normal coronaries. We have been requested to perform a right and left heart catheterization.  Plan:  The patient was counseled to undergo left and right heart catheterization, and coronary angiography. The procedural risks and benefits were discussed in detail. The risks discussed included death, stroke, myocardial infarction, life-threatening bleeding, limb ischemia, kidney injury, allergy, and possible emergency cardiac surgery. The risk of these significant complications were estimated to occur less than 1% of the time. After discussion, the patient has agreed to proceed.  Lyn Records III 01/04/2017 8:46 AM

## 2017-01-09 DIAGNOSIS — R413 Other amnesia: Secondary | ICD-10-CM | POA: Diagnosis not present

## 2017-01-09 DIAGNOSIS — F320 Major depressive disorder, single episode, mild: Secondary | ICD-10-CM | POA: Diagnosis not present

## 2017-02-02 ENCOUNTER — Ambulatory Visit: Payer: Self-pay | Admitting: Cardiovascular Disease

## 2017-02-06 ENCOUNTER — Ambulatory Visit: Payer: BLUE CROSS/BLUE SHIELD | Admitting: Thoracic Surgery (Cardiothoracic Vascular Surgery)

## 2017-02-10 ENCOUNTER — Ambulatory Visit (INDEPENDENT_AMBULATORY_CARE_PROVIDER_SITE_OTHER): Payer: BLUE CROSS/BLUE SHIELD | Admitting: Thoracic Surgery (Cardiothoracic Vascular Surgery)

## 2017-02-10 ENCOUNTER — Encounter: Payer: Self-pay | Admitting: Thoracic Surgery (Cardiothoracic Vascular Surgery)

## 2017-02-10 VITALS — BP 129/75 | HR 64 | Resp 16 | Ht 72.0 in | Wt 178.0 lb

## 2017-02-10 DIAGNOSIS — I350 Nonrheumatic aortic (valve) stenosis: Secondary | ICD-10-CM | POA: Diagnosis not present

## 2017-02-10 DIAGNOSIS — T8209XD Other mechanical complication of heart valve prosthesis, subsequent encounter: Secondary | ICD-10-CM | POA: Diagnosis not present

## 2017-02-10 DIAGNOSIS — T8203XD Leakage of heart valve prosthesis, subsequent encounter: Secondary | ICD-10-CM

## 2017-02-10 DIAGNOSIS — I5042 Chronic combined systolic (congestive) and diastolic (congestive) heart failure: Secondary | ICD-10-CM

## 2017-02-10 DIAGNOSIS — I351 Nonrheumatic aortic (valve) insufficiency: Secondary | ICD-10-CM

## 2017-02-10 DIAGNOSIS — Z953 Presence of xenogenic heart valve: Secondary | ICD-10-CM | POA: Diagnosis not present

## 2017-02-10 NOTE — Progress Notes (Addendum)
[address]       [name] [address]             [phone]     CARDIOTHORACIC SURGERY OFFICE NOTE  Referring Provider is Wendall Stade, MD PCP is Lupe Carney, MD   HPI:  Patient returns to the office today for follow-up of prosthetic valve dysfunction with severe symptomatic aortic stenosis and aortic insufficiency status post aortic valve replacement using a bioprosthetic tissue valve in 2010. He was last seen in our office on 11/22/2016 at which time the patient decided to proceed with surgery. He wanted to hold off until late February for personal reasons, and surgery has been scheduled for 02/21/2017. He reports no new problems or complaints over the past few months. He states that his exercise tolerance is decreased but it has not changed substantially over the past couple of months. He denies any exertional shortness of breath with low level activity and he is very comfortable with ordinary activities around the house. He admits that he gets short of breath and tired with more strenuous exertion. He denies any resting shortness of breath, PND, orthopnea, or lower extremity edema. He has not had any chest pain or chest tightness. Appetite is stable. He states that he has lost 4 or 5 pounds over the past 6 months, but he feels that this is related to changing his eating habits. He admits that he has had some problems with concentration and short-term memory loss. He feels this may be related to stress. He has been somewhat depressed for a variety of personal reasons. The remainder of his review of systems is unremarkable.   Current Outpatient Prescriptions  Medication Sig Dispense Refill  . amoxicillin (AMOXIL) 500 MG tablet Take 2,000 mg by mouth once as needed (takes prior to dental surgeries).     . citalopram (CELEXA) 10 MG tablet Take 10 mg by mouth daily.  12  . Multiple Vitamin (MULTIVITAMIN WITH MINERALS) TABS tablet Take 1 tablet by mouth daily. 200  MG    . Omega-3 Fatty Acids (FISH OIL PO) Take 1 capsule by mouth daily. 750 MG    . aspirin EC 81 MG tablet Take 1 tablet (81 mg total) by mouth daily. (Patient not taking: Reported on 02/10/2017) 90 tablet 3  . Phosphatidylserine 100 MG CAPS Take 100 mg by mouth daily.     No current facility-administered medications for this visit.       Physical Exam:   BP 129/75 (BP Location: Right Arm, Patient Position: Sitting, Cuff Size: Large)   Pulse 64   Resp 16   Ht 6' (1.829 m)   Wt 178 lb (80.7 kg)   SpO2 96% Comment: ON RA  BMI 24.14 kg/m   General:  Well-appearing  Chest:   Clear to auscultation  CV:   Regular rate and rhythm with prominent systolic and diastolic murmurs  Incisions:  n/a  Abdomen:  Soft nontender  Extremities:  Warm and well-perfused   Diagnostic Tests:  Right/Left Heart Cath and Coronary Angiography  Conclusion     There is mild (2+) aortic regurgitation.  There is mild (2+) aortic regurgitation.  Dist LAD lesion, 30 %stenosed.  Hemodynamic findings consistent with pulmonary hypertension.    Widely patent left and right coronary systems. Right coronary dominant.  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  Mildly dilated aortic root with trivial to mild peri-valvular aortic regurgitation, non-coronary cusp region.  LV function not assessed.  Normal right heart pressures.  Recommendation:   Follow-up with Dr. Cornelius Moras for now for replacement   Indications   Prosthetic cardiac paravalvular leak, initial encounter [T82.03XA (ICD-10-CM)]  Aortic valve regurgitation, acquired [I35.1 (ICD-10-CM)]  Chronic systolic heart failure (HCC) [I50.22 (ICD-10-CM)]  Procedural Details/Technique   Technical Details The right radial area was sterilely prepped and draped. Intravenous sedation with Versed and fentanyl was administered. 1% Xylocaine was infiltrated to achieve local analgesia. A double wall stick with an angiocath  was utilized to obtain intra-arterial access. The modified Seldinger technique was used to place a 53F " Slender" sheath in the right radial artery. Weight based heparin was administered. Coronary angiography was done using 5 F catheters. Right coronary angiography was performed with a JR4. Left ventricular hemodynamic recordings were not made. The prosthetic valve was not crossed retrograde. Left coronary angiography was performed with a JL 3.5 cm. Supravalvular aortography was performed with a 5 French angled pigtail 20 cc/s for a total of 50 cc.  Right heart catheterization was performed by exchanging an antecubital angiocath IV for a 5 French brachial sheath. 1% Xylocaine local infiltration at the IV site was given prior to sheath exchange. A double glove technique was used. The modified Seldinger technique was employed. After sheath insertion, right heart cath was performed using a 5 French balloon tipped catheter. Pressures were recorded in each chamber. A main pulmonary artery O2 saturation was obtained. I wedge pressure was also measured.   Hemostasis was achieved using a pneumatic band.  During this procedure the patient is administered a total of Versed 3 mg and Fentanyl 50 mg to achieve and maintain moderate conscious sedation. The patient's heart rate, blood pressure, and oxygen saturation are monitored continuously during the procedure. The period of conscious sedation is 59 minutes, of which I was present face-to-face 100% of this time.   Estimated blood loss <50 mL.  During this procedure the patient was administered the following to achieve and maintain moderate conscious sedation: Versed 3 mg, Fentanyl 50 mcg, while the patient's heart rate, blood pressure, and oxygen saturation were continuously monitored. The period of conscious sedation was 59 minutes, of which I was present face-to-face 100% of this time.    Coronary Findings   Dominance: Right  Left Anterior Descending  Dist LAD  lesion, 30% stenosed. 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 No fixed obstructive disease is noted.  Right Heart   Right Heart Pressures Hemodynamic findings consistent with pulmonary hypertension.    Left Heart   Aortic Valve There is mild (2+) aortic regurgitation. A bioprosthetic aortic valve is noted onset of fluoroscopy.    Aorta Aortic Root: The aortic root is enlarged. There is mild (2+) aortic regurgitation.   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    Coronary Diagrams   Diagnostic Diagram     Implants     No implant documentation for this case.  PACS Images   Show images for Cardiac catheterization   Link to Procedure Log   Procedure Log    Hemo Data   Flowsheet Row Most Recent Value  Fick Cardiac Output 4.63 L/min  Fick Cardiac Output Index 2.28 (L/min)/BSA  RA A Wave 11 mmHg  RA V Wave 8 mmHg  RA Mean 6 mmHg  RV Systolic Pressure 41 mmHg  RV Diastolic Pressure 2 mmHg  RV EDP 12 mmHg  PA Systolic Pressure 33 mmHg  PA  Diastolic Pressure 13 mmHg  PA Mean 20 mmHg  PW A Wave 22 mmHg  PW V Wave 23 mmHg  PW Mean 13 mmHg  AO Systolic Pressure 105 mmHg  AO Diastolic Pressure 65 mmHg  AO Mean 83 mmHg  QP/QS 1  TPVR Index 8.76 HRUI  TSVR Index 36.36 HRUI  PVR SVR Ratio 0.09  TPVR/TSVR Ratio 0.24     Cardiac CTA  MEDICATIONS: Sub lingual nitro. 4mg  and lopressor 10mg   TECHNIQUE: The patient was scanned on a Philips 256 slice scanner. Gantry rotation speed was 270 msecs. Collimation was .18mm. A 100 kV prospective scan was triggered in the descending thoracic aorta at 111 HU's with 5% padding centered around 78% of the R-R interval. Average HR during the scan was 58 bpm. The 3D data set was interpreted on a dedicated work station using MPR, MIP and VRT modes. A total of 80cc of contrast was used.  FINDINGS: Non-cardiac: See separate report from [facility]  Radiology. No significant findings on limited lung and soft tissue windows.  Aortic Valve: Patient has a 21 mm [facility] Ease bovine pericardial tissue valve. There is restricted leaflet motion and the right and non coronary cusps appear fused. The leaflets are not calcified  The struts appear normal with a separation of 16 mm and no "creep". The coronary arteries come off quite low within the valve especially the LM. They both appear well within the superior margin of the strut cage.  Aorta: Not dilated or aneurysmal. Normal take off of the great vessels. Pseudo-coarctation of the distal arch with diameter of 18 mm as opposed to 24 mm for arch and descending thoracic aorta  Sinus:  26 mm  STJ:  24 mm  Aortic Root:  30.6 mm  Arch:  24 mm  Descending Thoracic aorta:  24 mm  Coronary Arteries: Right dominant with no anomalies  LM: Normal  LAD:  Normal  IM:  Large and normal  D1: Normal  D2: Normal  Circumflex: Normal  OM1: Normal  RCA:  Right dominant and normal  PDA: Normal  PLA:  Normal  IMPRESSION: 1) 21 mm Edwards Magna Ease tissue valve with non calcified restrictive leaflet motion? Fused non/right coronary cusps  2) Normal right dominant coronary arteries  3) Normal aortic root see dimensions above  4) Pseudocoarctation of distal aortic arch  5) Normal take off of great vessels from the arch  6) Coronary ostium very low lying within the existing tissue AVR  [name]   Electronically Signed   By: [name] M.D.   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  He describes mild but progressive symptoms of exertional shortness of breath and fatigue consistent with chronic combined systolic and diastolic congestive heart failure, [place] Heart Association functional class I-II.   I have personally reviewed the patient's transthoracic echocardiogram, cardiac catheterization, and cardiac gated CT angiogram. Peak velocity across the aortic valve now measures 4.4 m/s corresponding to mean transvalvular gradient estimated 45 mmHg. Left ventricular systolic function has declined further with ejection fraction estimated at only 30-35%. There remains moderate paravalvular leak but there is also significant AI due to leaflet dysfunction.   The patient does not have significant coronary artery disease but the origin of both the left main and the right coronary arteries are fairly  low and relatively close to the sewing ring of the old aortic valve.  There is a possibility that aortic root enlargement, root replacement, and/or coronary artery bypass grafting might be necessary depending upon intraoperative findings.  Risks associated with surgery will be considerable, but the patient remains otherwise healthy and free of comorbid medical problems.  I recommend redo replacement.  I favor use of a low profile bileaflet mechanical prosthetic valve.    Plan:  I again reviewed the indications, risks, and potential benefits of surgery at length with the patient and his wife in the office today. Expectations for his postoperative convalescence at been discussed.   Discussion was held comparing the relative risks of mechanical valve replacement with need for lifelong anticoagulation versus use of a bioprosthetic tissue valve and the associated potential for late structural valve deterioration and failure.  This discussion was placed in the context of the patient's particular circumstances, and as a result the patient specifically requests that their valve be replaced using a mechanical valve.   The patient understands and accepts all potential associated risks of surgery including but not limited to risk of death, stroke, myocardial infarction, congestive  heart failure, respiratory failure, renal failure, pneumonia, bleeding requiring blood transfusion and or reexploration, arrhythmia, heart block or bradycardia requiring permanent pacemaker, aortic dissection or other major vascular complication, pleural effusions or other delayed complications related to continued congestive heart failure, and other late complications related to valve replacement including structural valve deterioration and failure, thrombosis, endocarditis, or paravalvular leak.   I spent in excess of 45 minutes during the conduct of this office consultation and >50% of this time involved direct face-to-face encounter with the patient for counseling and/or coordination of their care.    [name], MD 02/10/2017 9:38 AM

## 2017-02-10 NOTE — Patient Instructions (Signed)
Stop taking fish oil , Phosphatidylserine, and aspirin  Continue taking all other medications without change through the day before surgery.  Have nothing to eat or drink after midnight the night before surgery.  On the morning of surgery take only citalopram with a sip of water.

## 2017-02-13 ENCOUNTER — Ambulatory Visit: Payer: BLUE CROSS/BLUE SHIELD | Admitting: Thoracic Surgery (Cardiothoracic Vascular Surgery)

## 2017-02-14 ENCOUNTER — Ambulatory Visit: Payer: BLUE CROSS/BLUE SHIELD | Admitting: Thoracic Surgery (Cardiothoracic Vascular Surgery)

## 2017-02-17 ENCOUNTER — Other Ambulatory Visit (HOSPITAL_COMMUNITY): Payer: Self-pay | Admitting: *Deleted

## 2017-02-17 ENCOUNTER — Encounter (HOSPITAL_COMMUNITY): Payer: Self-pay

## 2017-02-17 ENCOUNTER — Ambulatory Visit (HOSPITAL_BASED_OUTPATIENT_CLINIC_OR_DEPARTMENT_OTHER)
Admission: RE | Admit: 2017-02-17 | Discharge: 2017-02-17 | Disposition: A | Discharge status: Home / Self Care | Payer: BLUE CROSS/BLUE SHIELD | Source: Ambulatory Visit | Attending: Thoracic Surgery (Cardiothoracic Vascular Surgery) | Admitting: Thoracic Surgery (Cardiothoracic Vascular Surgery)

## 2017-02-17 ENCOUNTER — Encounter (HOSPITAL_COMMUNITY)
Admission: RE | Admit: 2017-02-17 | Discharge: 2017-02-17 | Disposition: A | Discharge status: Home / Self Care | Payer: BLUE CROSS/BLUE SHIELD | Source: Ambulatory Visit | Attending: Thoracic Surgery (Cardiothoracic Vascular Surgery) | Admitting: Thoracic Surgery (Cardiothoracic Vascular Surgery)

## 2017-02-17 ENCOUNTER — Ambulatory Visit (HOSPITAL_COMMUNITY)
Admission: RE | Admit: 2017-02-17 | Discharge: 2017-02-17 | Disposition: A | Discharge status: Home / Self Care | Payer: BLUE CROSS/BLUE SHIELD | Source: Ambulatory Visit | Attending: Thoracic Surgery (Cardiothoracic Vascular Surgery) | Admitting: Thoracic Surgery (Cardiothoracic Vascular Surgery)

## 2017-02-17 DIAGNOSIS — I351 Nonrheumatic aortic (valve) insufficiency: Secondary | ICD-10-CM

## 2017-02-17 DIAGNOSIS — Z01818 Encounter for other preprocedural examination: Secondary | ICD-10-CM | POA: Insufficient documentation

## 2017-02-17 DIAGNOSIS — R001 Bradycardia, unspecified: Secondary | ICD-10-CM | POA: Insufficient documentation

## 2017-02-17 DIAGNOSIS — I350 Nonrheumatic aortic (valve) stenosis: Secondary | ICD-10-CM | POA: Insufficient documentation

## 2017-02-17 DIAGNOSIS — I252 Old myocardial infarction: Secondary | ICD-10-CM | POA: Insufficient documentation

## 2017-02-17 DIAGNOSIS — I6523 Occlusion and stenosis of bilateral carotid arteries: Secondary | ICD-10-CM | POA: Diagnosis not present

## 2017-02-17 DIAGNOSIS — I517 Cardiomegaly: Secondary | ICD-10-CM | POA: Insufficient documentation

## 2017-02-17 HISTORY — DX: Other complications of anesthesia, initial encounter: T88.59XA

## 2017-02-17 HISTORY — DX: Major depressive disorder, single episode, unspecified: F32.9

## 2017-02-17 HISTORY — DX: Frequency of micturition: R35.0

## 2017-02-17 HISTORY — DX: Depression, unspecified: F32.A

## 2017-02-17 HISTORY — DX: Adverse effect of unspecified anesthetic, initial encounter: T41.45XA

## 2017-02-17 LAB — PULMONARY FUNCTION TEST
DL/VA % pred: 94 %
DL/VA: 4.48 ml/min/mmHg/L
DLCO unc % pred: 90 %
DLCO unc: 31.79 ml/min/mmHg
FEF 25-75 Post: 3.27 L/sec
FEF 25-75 Pre: 2.88 L/sec
FEF2575-%Change-Post: 13 %
FEF2575-%PRED-PRE: 91 %
FEF2575-%Pred-Post: 103 %
FEV1-%Change-Post: 2 %
FEV1-%PRED-POST: 93 %
FEV1-%PRED-PRE: 91 %
FEV1-POST: 3.62 L
FEV1-PRE: 3.52 L
FEV1FVC-%Change-Post: -2 %
FEV1FVC-%Pred-Pre: 98 %
FEV6-%Change-Post: 5 %
FEV6-%PRED-PRE: 95 %
FEV6-%Pred-Post: 101 %
FEV6-POST: 4.93 L
FEV6-Pre: 4.68 L
FEV6FVC-%Change-Post: 0 %
FEV6FVC-%PRED-POST: 105 %
FEV6FVC-%PRED-PRE: 104 %
FVC-%Change-Post: 5 %
FVC-%PRED-PRE: 91 %
FVC-%Pred-Post: 96 %
FVC-POST: 4.94 L
FVC-PRE: 4.7 L
POST FEV6/FVC RATIO: 100 %
PRE FEV1/FVC RATIO: 75 %
Post FEV1/FVC ratio: 73 %
Pre FEV6/FVC Ratio: 100 %
RV % PRED: 102 %
RV: 2.41 L
TLC % PRED: 101 %
TLC: 7.49 L

## 2017-02-17 LAB — VAS US DOPPLER PRE CABG
LCCADSYS: -66 cm/s
LCCAPDIAS: 12 cm/s
LEFT ECA DIAS: -10 cm/s
LEFT VERTEBRAL DIAS: -7 cm/s
Left CCA dist dias: -12 cm/s
Left CCA prox sys: 89 cm/s
Left ICA dist dias: -14 cm/s
Left ICA dist sys: -35 cm/s
Left ICA prox dias: -12 cm/s
Left ICA prox sys: -39 cm/s
RCCADSYS: -53 cm/s
RCCAPDIAS: 11 cm/s
RCCAPSYS: 124 cm/s
RIGHT ECA DIAS: -11 cm/s
RIGHT VERTEBRAL DIAS: -4 cm/s

## 2017-02-17 LAB — COMPREHENSIVE METABOLIC PANEL
ALBUMIN: 4.2 g/dL (ref 3.5–5.0)
ALT: 28 U/L (ref 17–63)
AST: 23 U/L (ref 15–41)
Alkaline Phosphatase: 47 U/L (ref 38–126)
Anion gap: 9 (ref 5–15)
BUN: 18 mg/dL (ref 6–20)
CHLORIDE: 110 mmol/L (ref 101–111)
CO2: 21 mmol/L — ABNORMAL LOW (ref 22–32)
CREATININE: 0.87 mg/dL (ref 0.61–1.24)
Calcium: 9.5 mg/dL (ref 8.9–10.3)
GFR calc Af Amer: >60 mL/min (ref >60)
GFR calc non Af Amer: >60 mL/min (ref >60)
GLUCOSE: 92 mg/dL (ref 65–99)
Potassium: 4.1 mmol/L (ref 3.5–5.1)
SODIUM: 140 mmol/L (ref 135–145)
Total Bilirubin: 0.9 mg/dL (ref 0.3–1.2)
Total Protein: 6.5 g/dL (ref 6.5–8.1)

## 2017-02-17 LAB — BLOOD GAS, ARTERIAL
Acid-Base Excess: 0.2 mmol/L (ref 0.0–2.0)
Bicarbonate: 23.7 mmol/L (ref 20.0–28.0)
Drawn by: 421801
FIO2: 21
O2 Saturation: 92.4 %
Patient temperature: 98.6
pCO2 arterial: 34.1 mmHg (ref 32.0–48.0)
pH, Arterial: 7.455 — ABNORMAL HIGH (ref 7.350–7.450)
pO2, Arterial: 61.5 mmHg — ABNORMAL LOW (ref 83.0–108.0)

## 2017-02-17 LAB — URINALYSIS, ROUTINE W REFLEX MICROSCOPIC
Bilirubin Urine: NEGATIVE
GLUCOSE, UA: NEGATIVE mg/dL
Hgb urine dipstick: NEGATIVE
KETONES UR: NEGATIVE mg/dL
LEUKOCYTES UA: NEGATIVE
NITRITE: NEGATIVE
PROTEIN: NEGATIVE mg/dL
Specific Gravity, Urine: 1.019 (ref 1.005–1.030)
pH: 6 (ref 5.0–8.0)

## 2017-02-17 LAB — CBC
HCT: 42.3 % (ref 39.0–52.0)
HEMOGLOBIN: 14.8 g/dL (ref 13.0–17.0)
MCH: 30.5 pg (ref 26.0–34.0)
MCHC: 35 g/dL (ref 30.0–36.0)
MCV: 87.2 fL (ref 78.0–100.0)
PLATELETS: 120 10*3/uL — AB (ref 150–400)
RBC: 4.85 MIL/uL (ref 4.22–5.81)
RDW: 12.4 % (ref 11.5–15.5)
WBC: 7.8 10*3/uL (ref 4.0–10.5)

## 2017-02-17 LAB — SURGICAL PCR SCREEN
MRSA, PCR: NEGATIVE
Staphylococcus aureus: NEGATIVE

## 2017-02-17 LAB — PROTIME-INR
INR: 1.13
Prothrombin Time: 14.6 seconds (ref 11.4–15.2)

## 2017-02-17 LAB — APTT: aPTT: 32 s (ref 24–36)

## 2017-02-17 MED ORDER — ALBUTEROL SULFATE (2.5 MG/3ML) 0.083% IN NEBU
2.5000 mg | INHALATION_SOLUTION | Freq: Once | RESPIRATORY_TRACT | Status: AC
  Administered 2017-02-17: 2.5 mg via RESPIRATORY_TRACT

## 2017-02-17 NOTE — Pre-Procedure Instructions (Signed)
JECOREY FUSILLO  02/17/2017      [id number] - [place], [place] - [address] AT [facility] [address] [place] [address] Phone: [phone] Fax: [phone]    Your procedure is scheduled on 02-21-2017  Tuesday .  Report to [facility] Admitting at 5:30 A.M.   Call this number if you have problems the morning of surgery:  [phone]   Remember:  Do not eat food or drink liquids after midnight.   Take these medicines the morning of surgery with A SIP OF WATER citalopram(Celexa)                STOP ASPIRIN,ANTIINFLAMATORIES (IBUPROFEN,ALEVE,MOTRIN,ADVIL,GOODY'S POWDERS),HERBAL SUPPLEMENTS,FISH OIL,AND VITAMINS 5-7 DAYS PRIOR TO SURGERY    Do not wear jewelry,  Do not wear lotions, powders, or perfumes, or deoderant.  Do not shave 48 hours prior to surgery.  Men may shave face and neck.   Do not bring valuables to the hospital.  [facility] is not responsible for any belongings or valuables.  Contacts, dentures or bridgework may not be worn into surgery.  Leave your suitcase in the car.  After surgery it may be brought to your room.  For patients admitted to the hospital, discharge time will be determined by your treatment team.  Patients discharged the day of surgery will not be allowed to drive home.   Please read over the following fact sheets that you were given. Coughing and Deep Breathing and Surgical Site Infection Prevention  Incentive Spirometry                                 Special Instructions: [place] - Preparing for Surgery  Before surgery, you can play an important role.  Because skin is not sterile, your skin needs to be as free of germs as possible.  You can reduce the number of germs on you skin by washing with CHG (chlorahexidine gluconate) soap before surgery.  CHG is an antiseptic cleaner which kills germs and bonds with the skin to continue killing germs even after  washing.  Please DO NOT use if you have an allergy to CHG or antibacterial soaps.  If your skin becomes reddened/irritated stop using the CHG and inform your nurse when you arrive at Short Stay.  Do not shave (including legs and underarms) for at least 48 hours prior to the first CHG shower.  You may shave your face.  Please follow these instructions carefully:   1.  Shower with CHG Soap the night before surgery and the   morning of Surgery.  2.  If you choose to wash your hair, wash your hair first as usual with your normal shampoo.  3.  After you shampoo, rinse your hair and body thoroughly to remove the  Shampoo.  4.  Use CHG as you would any other liquid soap.  You can apply chg directly  to the skin and wash gently with scrungie or a clean washcloth.  5.  Apply the CHG Soap to your body ONLY FROM THE NECK DOWN.   Do not use on open wounds or open sores.  Avoid contact with your eyes,  ears, mouth and genitals (private parts).  Wash genitals (private parts) with your normal soap.  6.  Wash thoroughly, paying special attention to the area where your surgery will be performed.  7.  Thoroughly rinse your  body with warm water from the neck down.  8.  DO NOT shower/wash with your normal soap after using and rinsing o  the CHG Soap.  9.  Pat yourself dry with a clean towel.            10.  Wear clean pajamas.            11.  Place clean sheets on your bed the night of your first shower and do not sleep with pets.  Day of Surgery  Do not apply any lotions/deodorants the morning of surgery.  Please wear clean clothes to the hospital/surgery center.

## 2017-02-17 NOTE — Progress Notes (Signed)
Pre-op Cardiac Surgery  Carotid Findings:   Findings are consistent with a 1-39 percent stenosis involving the right internal carotid artery and the left internal carotid artery. The vertebral arteries demonstrate antegrade flow.  Upper Extremity Right Left  Brachial Pressures 114  Triphasic 108  Triphasic  Radial Waveforms Triphasic Triphasic  Ulnar Waveforms Triphasic Triphasic  Palmar Arch (Allen's Test) Palmar waveforms remain within normal limits with radial and ulnar compression. Palmar waveforms remain within normal limits with radial and ulnar compression.   02/17/17 3:46 PM Olen Cordial RVT

## 2017-02-17 NOTE — H&P (Signed)
[address]       [name] [address]             [phone]          CARDIOTHORACIC SURGERY HISTORY AND PHYSICAL EXAM  Referring Provider is Wendall Stade, MD PCP is Lupe Carney, MD      Chief Complaint  Patient presents with  . Follow-up    rediscuss AVR...originally consulted in May 2016...ECHO 10/17/16    HPI:  Patient is a 61 year old gentleman with history of bicuspid aortic valve disease who underwent aortic valve replacement using a bioprosthetic tissue valve via right mini thoracotomy approach December 2010. The patient's valve was replaced using a 21 mm [facility] Ease bovine pericardial tissue valve.Early postoperatively the patient was noted to have a mild paravalvular leak and somewhat elevated transvalvular gradients across the aortic valve suggestive of mild patient-prosthesis mismatch.Peak velocity across the aortic valve was measured 3.7 m/sec at the time of his first post-operative echocardiogram performed in January 2011, corresponding to a mean transvalvular gradient of 29 mmHg.At that time left ventricular systolic function appeared normal with ejection fraction estimated 55%. Since that time serial transthoracic echocardiograms have demonstrated a gradual decline in left ventricular systolic function with slowly rising transvalvular gradient and increased paravalvular leak.  Approximately 2 years ago he was referrred to [facility] to consider percutaneous treatment options for management of the patient's paravalvular leak but redo aortic valve replacement was recommended. The patient was referred back to our office for surgical consultation and I had the opportunity to see him in May 2016. At that time he remained essentially asymptomatic but he admitted that he was starting to appreciate a slight decline in his exercise tolerance.  Redo aortic valve replacement was recommended. The patient decided to hold off  at that time and has been followed intermittently ever since by Dr. Eden Emms. Recent follow-up echocardiogram revealed further decline in the patient's left ventricular systolic function and increased transvalvular gradient across the aortic valve. The patient returns to our office today for follow-up.  The patient is now separated from his wife who accompanies him for his office visit today. He admits that he has had a further decline in his exercise tolerance and he is no longer exercising his regularly or as vigorously as he used to in the past. He otherwise denies any significant problems with exertional shortness of breath, and he is quite comfortable with ordinary day-to-day activities. He has not had any chest pain or chest tightness either with activity or at rest. He has had occasional slight dizzy spells when he stands up from a seated position. He denies syncope. He denies any history of PND, orthopnea, or lower extremity edema. He complains of some problems related to stress and depression as well as some problems with short-term memory loss of unclear significance.  His appetite is stable but he has lost 4 or 5 pounds in weight. He denies any fevers or chills.  Patient returns to the office today for follow-up of prosthetic valve dysfunction with severe symptomatic aortic stenosis and aortic insufficiency status post aortic valve replacement using a bioprosthetic tissue valve in 2010. He was last seen in our office on 11/22/2016 at which time the patient decided to proceed with surgery. He wanted to hold off until late February for personal reasons, and surgery has been scheduled for 02/21/2017. He reports no new problems or complaints over the past few months. He states that  his exercise tolerance is decreased but it has not changed substantially over the past couple of months. He denies any exertional shortness of breath with low level activity and he is very comfortable with ordinary activities  around the house. He admits that he gets short of breath and tired with more strenuous exertion. He denies any resting shortness of breath, PND, orthopnea, or lower extremity edema. He has not had any chest pain or chest tightness. Appetite is stable. He states that he has lost 4 or 5 pounds over the past 6 months, but he feels that this is related to changing his eating habits. He admits that he has had some problems with concentration and short-term memory loss. He feels this may be related to stress. He has been somewhat depressed for a variety of personal reasons. The remainder of his review of systems is unremarkable.    Past Medical History:  Diagnosis Date  . Aortic insufficiency   . Aortic valve disease    a. Biscuspid AVR with heavy annular calcification s/p tissue AVR (pt preferred). b. Mod-severe perivalvular regurg by cath 08/2013, mod by TEE 08/2013, gradients lower by TEE than by echo.   . Complication of anesthesia    when intubated  damage to vocal after surgery  2010  . Depression   . Frequent urination   . Hearing loss    bilateral, pt wears hearing aids   . Nonischemic cardiomyopathy (HCC)    a. EF normal immediately after AVR, then decreased to 40-45% by echo & 34% by MRI 2011. b. Most recent EF 40-45% by echo/TEE 08/2013.  . Paravalvular leak of prosthetic heart valve   . Prosthetic valve dysfunction   . Reflux   . Rosacea   . S/P aortic valve replacement with bioprosthetic valve 12/08/2009   21mm [facility] Ease bovine pericardial tissue valve placed via right mini thoracotomy approach  . Shortness of breath    with exertion    Past Surgical History:  Procedure Laterality Date  . AORTIC VALVE REPLACEMENT  12/08/2009   Procedure: Right miniature anterior thoracotomy for aortic valve replacement (32-mm [facility] Ease pericardial tissue valve) Surgeon: [name], MD assistant: Kerin Perna, MD  . CARDIAC CATHETERIZATION N/A 01/04/2017   Procedure:  Right/Left Heart Cath and Coronary Angiography;  Surgeon: Lyn Records, MD;  Location: [facility] INVASIVE CV LAB;  Service: Cardiovascular;  Laterality: N/A;  . INGUINAL HERNIA REPAIR     Left  . INGUINAL HERNIA REPAIR  1960   right side  . LEFT AND RIGHT HEART CATHETERIZATION WITH CORONARY ANGIOGRAM N/A 09/19/2013   Procedure: LEFT AND RIGHT HEART CATHETERIZATION WITH CORONARY ANGIOGRAM;  Surgeon: Wendall Stade, MD;  Location: [facility] CATH LAB;  Service: Cardiovascular;  Laterality: N/A;  . TEE WITHOUT CARDIOVERSION N/A 09/19/2013   Procedure: TRANSESOPHAGEAL ECHOCARDIOGRAM (TEE);  Surgeon: Wendall Stade, MD;  Location: [facility] ENDOSCOPY;  Service: Cardiovascular;  Laterality: N/A;  . TOOTH EXTRACTION Left August 2014   left bottom molar    No family history on file.  Social History Social History  Substance Use Topics  . Smoking status: Never Smoker  . Smokeless tobacco: Never Used  . Alcohol use 0.6 oz/week    1 Cans of beer per week     Comment: occasional    Prior to Admission medications   Medication Sig Start Date End Date Taking? Authorizing Provider  citalopram (CELEXA) 10 MG tablet Take 10 mg by mouth daily. 12/07/16  Yes Historical Provider,  MD  amoxicillin (AMOXIL) 500 MG tablet Take 2,000 mg by mouth once as needed (takes prior to dental surgeries).     Historical Provider, MD  aspirin EC 81 MG tablet Take 1 tablet (81 mg total) by mouth daily. Patient not taking: Reported on 02/10/2017 12/14/16   Wendall Stade, MD  Multiple Vitamin (MULTIVITAMIN WITH MINERALS) TABS tablet Take 1 tablet by mouth daily. On hold for procedure    Historical Provider, MD  Omega-3 Fatty Acids (FISH OIL PO) Take 1 capsule by mouth daily. 750 MG On hold for procedure    Historical Provider, MD    No Known Allergies  Review of Systems:              General:                      norma appetite, decreased energy, no weight gain, + weight loss, no fever             Cardiac:                       no chest  pain with exertion, no chest pain at rest, + SOB with more strenuous exertion, no resting SOB, no PND, no orthopnea, no palpitations, no arrhythmia, no atrial fibrillation, no LE edema, occasional dizzy spells, no syncope             Respiratory:                 no shortness of breath, no home oxygen, no productive cough, no dry cough, no bronchitis, no wheezing, no hemoptysis, no asthma, no pain with inspiration or cough, no sleep apnea, no CPAP at night             GI:                               no difficulty swallowing, no reflux, no frequent heartburn, no hiatal hernia, no abdominal pain, no constipation, no diarrhea, no hematochezia, no hematemesis, no melena             GU:                              no dysuria,  no frequency, no urinary tract infection, no hematuria, no enlarged prostate, no kidney stones, no kidney disease             Vascular:                     no pain suggestive of claudication, no pain in feet, no leg cramps, no varicose veins, no DVT, no non-healing foot ulcer             Neuro:                         no stroke, no TIA's, no seizures, no headaches, no temporary blindness one eye,  no slurred speech, no peripheral neuropathy, no chronic pain, no instability of gait, + mild memory/cognitive dysfunction             Musculoskeletal:         no arthritis, no joint swelling, no myalgias, no difficulty walking, normal mobility              Skin:  no rash, no itching, no skin infections, no pressure sores or ulcerations             Psych:                         no anxiety, + depression, no nervousness, + unusual recent stress             Eyes:                           no blurry vision, no floaters, no recent vision changes, + wears glasses or contacts             ENT:                            + hearing loss, no loose or painful teeth, no dentures, last saw dentist 7/17             Hematologic:               no easy bruising, no abnormal bleeding,  no clotting disorder, no frequent epistaxis             Endocrine:                   no diabetes, does not check CBG's at home                           Physical Exam:              BP 136/82 (BP Location: Left Arm, Patient Position: Sitting, Cuff Size: Normal)   Pulse 66   Resp 16   Ht 6' (1.829 m)   Wt 175 lb (79.4 kg)   SpO2 97% Comment: ON RA  BMI 23.73 kg/m              General:                      Thin,  well-appearing             HEENT:                       Unremarkable              Neck:                           no JVD, no bruits, no adenopathy              Chest:                          clear to auscultation, symmetrical breath sounds, no wheezes, no rhonchi              CV:                              RRR, grade III/VI systolic murmur              Abdomen:                    soft, non-tender, no masses              Extremities:  warm, well-perfused, pulses palpable, no LE edema             Rectal/GU                   Deferred             Neuro:                         Grossly non-focal and symmetrical throughout             Skin:                            Clean and dry, no rashes, no breakdown   Diagnostic Tests:  Transthoracic Echocardiography  Patient: Coulter, Oldaker MR #: [id number] Study Date: 10/17/2016 Gender: M Age: 35 Height: 182.9 cm Weight: 82.6 kg BSA: 2.05 m^2 Pt. Status: Room:  ATTENDING [name], M.D. ORDERING [name], M.D. REFERRING [name], M.D. SONOGRAPHER Randa Evens, Will PERFORMING [facility], Outpatient REFERRING Chilton Si, MD  cc:  ------------------------------------------------------------------- LV EF: 30% - 35%  ------------------------------------------------------------------- Indications: (Z95.2).  ------------------------------------------------------------------- History: PMH: S/p AVR (bioprosthetic). Dilated  cardiomyopathy. AI. Acquired from the patient and from the patient&'s chart. Congestive heart failure.  ------------------------------------------------------------------- Study Conclusions  - Left ventricle: The cavity size was mildly dilated. There was mild concentric hypertrophy. Systolic function was moderately to severely reduced. The estimated ejection fraction was in the range of 30% to 35%. Akinesis of the anteroseptal myocardium and hypokinesis of the inferoseptum. Doppler parameters are consistent with a reversible restrictive pattern, indicative of decreased left ventricular diastolic compliance and/or increased left atrial pressure (grade 3 diastolic dysfunction). Doppler parameters are consistent with high ventricular filling pressure. - Aortic valve: A bioprosthesis was present. Transvalvular gradients are severely elevated, slightly higher than when last checked 11/2014. There was moderate perivalvular regurgitation. Peak velocity (S): 443 cm/s. Mean gradient (S): 45 mm Hg. - Mitral valve: Transvalvular velocity was within the normal range. There was no evidence for stenosis. There was mild regurgitation. - Left atrium: The atrium was severely dilated. - Right ventricle: The cavity size was normal. Wall thickness was normal. Systolic function was normal. - Atrial septum: No defect or patent foramen ovale was identified by color flow Doppler. - Tricuspid valve: There was trivial regurgitation. - Pulmonary arteries: Systolic pressure was at the upper limits of normal. PA peak pressure: 37 mm Hg (S).  Impressions:  - Compared with echo 11/2014, mean gradient across the aortic valve has increased from 39 mmHg to 45 mmHg.  ------------------------------------------------------------------- Study data: Comparison was made to the study of 11/26/2014. Study status: Routine. Procedure: The patient reported no pain pre or post  test. Transthoracic echocardiography for left ventricular function evaluation and for assessment of valvular function. Image quality was adequate. Study completion: There were no complications. Transthoracic echocardiography. M-mode, complete 2D, spectral Doppler, and color Doppler. Birthdate: Patient birthdate: 1956/08/19. Age: Patient is 61 yr old. Sex: Gender: male. BMI: 24.7 kg/m^2. Blood pressure: 120/88 Patient status: Outpatient. Study date: Study date: 10/17/2016. Study time: 04:18 PM. Location: [facility]  -------------------------------------------------------------------  ------------------------------------------------------------------- Left ventricle: The cavity size was mildly dilated. There was mild concentric hypertrophy. Systolic function was moderately to severely reduced. The estimated ejection fraction was in the range of 30% to 35%. Regional wall motion abnormalities: Akinesis of the anteroseptal myocardium and hypokinesis of the inferoseptum. Doppler parameters are consistent with a reversible restrictive pattern, indicative  of decreased left ventricular diastolic compliance and/or increased left atrial pressure (grade 3 diastolic dysfunction). Doppler parameters are consistent with high ventricular filling pressure.  ------------------------------------------------------------------- Aortic valve: Poorly visualized. A bioprosthesis was present. Doppler: Transvalvular gradients are severely elevated, slightly higher than when last checked 11/2014. There was moderate perivalvular regurgitation. VTI ratio of LVOT to aortic valve: 0.14. Valve area (VTI): 0.54 cm^2. Indexed valve area (VTI): 0.26 cm^2/m^2. Peak velocity ratio of LVOT to aortic valve: 0.14. Valve area (Vmax): 0.55 cm^2. Indexed valve area (Vmax): 0.27 cm^2/m^2. Mean velocity ratio of LVOT to aortic valve: 0.15. Valve area (Vmean): 0.58 cm^2. Indexed  valve area (Vmean): 0.28 cm^2/m^2. Mean gradient (S): 45 mm Hg. Peak gradient (S): 78 mm Hg.  ------------------------------------------------------------------- Aorta: Aortic root: The aortic root was normal in size.  ------------------------------------------------------------------- Mitral valve: Structurally normal valve. Mobility was not restricted. Doppler: Transvalvular velocity was within the normal range. There was no evidence for stenosis. There was mild regurgitation. Peak gradient (D): 3 mm Hg.  ------------------------------------------------------------------- Left atrium: The atrium was severely dilated.  ------------------------------------------------------------------- Atrial septum: No defect or patent foramen ovale was identified by color flow Doppler.  ------------------------------------------------------------------- Right ventricle: The cavity size was normal. Wall thickness was normal. Systolic function was normal.  ------------------------------------------------------------------- Pulmonic valve: Structurally normal valve. Cusp separation was normal. Doppler: Transvalvular velocity was within the normal range. There was no evidence for stenosis. There was no regurgitation.  ------------------------------------------------------------------- Tricuspid valve: Structurally normal valve. Doppler: Transvalvular velocity was within the normal range. There was trivial regurgitation.  ------------------------------------------------------------------- Pulmonary artery: The main pulmonary artery was normal-sized. Systolic pressure was at the upper limits of normal.  ------------------------------------------------------------------- Right atrium: The atrium was normal in size.  ------------------------------------------------------------------- Pericardium: There was no pericardial  effusion.  ------------------------------------------------------------------- Systemic veins: Inferior vena cava: The vessel was dilated. The respirophasic diameter changes were in the normal range (>= 50%), consistent with elevated central venous pressure.  ------------------------------------------------------------------- Measurements  Left ventricle Value Reference LV ID, ED, PLAX chordal (H) 54.4 mm 43 - 52 LV ID, ES, PLAX chordal (H) 45.3 mm 23 - 38 LV fx shortening, PLAX chordal (L) 17 % >=29 LV PW thickness, ED 12.6 mm --------- IVS/LV PW ratio, ED 0.89 <=1.3 Stroke volume, 2D 58 ml --------- Stroke volume/bsa, 2D 28 ml/m^2 --------- LV ejection fraction, 1-p A4C 40 % --------- LV end-diastolic volume, 2-p 136 ml --------- LV end-systolic volume, 2-p 87 ml --------- LV ejection fraction, 2-p 36 % --------- Stroke volume, 2-p 49 ml --------- LV end-diastolic volume/bsa, 2-p 66 ml/m^2 --------- LV end-systolic volume/bsa, 2-p 42 ml/m^2 --------- Stroke volume/bsa, 2-p 23.9 ml/m^2 --------- LV e&', lateral 7.9 cm/s --------- LV E/e&', lateral 10.06 --------- LV e&', medial 4 cm/s --------- LV E/e&', medial 19.88 --------- LV e&', average 5.95 cm/s --------- LV E/e&', average 13.36 ---------  Ventricular septum Value Reference IVS  thickness, ED 11.2 mm ---------  LVOT Value Reference LVOT ID, S 22 mm --------- LVOT area 3.8 cm^2 --------- LVOT ID 22 mm --------- LVOT peak velocity, S 63.9 cm/s --------- LVOT mean velocity, S 48.4 cm/s --------- LVOT VTI, S 15.3 cm --------- LVOT peak gradient, S 2 mm Hg --------- Stroke volume (SV), LVOT DP 58.2 ml --------- Stroke index (SV/bsa), LVOT DP 28.3 ml/m^2 ---------  Aortic valve Value Reference Aortic valve peak velocity, S 443 cm/s --------- Aortic valve mean velocity, S 319 cm/s --------- Aortic valve VTI, S 107 cm --------- Aortic mean gradient, S 45 mm Hg --------- Aortic peak gradient, S 78 mm Hg --------- VTI  ratio, LVOT/AV 0.14 --------- Aortic valve area, VTI 0.54 cm^2 --------- Aortic valve area/bsa, VTI 0.26 cm^2/m^2 --------- Velocity ratio, peak, LVOT/AV 0.14 --------- Aortic valve area, peak velocity 0.55 cm^2 --------- Aortic valve area/bsa, peak 0.27 cm^2/m^2 --------- velocity Velocity ratio, mean, LVOT/AV 0.15 --------- Aortic valve area, mean velocity 0.58 cm^2 --------- Aortic valve area/bsa, mean 0.28 cm^2/m^2 --------- velocity Aortic regurg pressure half-time 466 ms ---------  Aorta Value  Reference Aortic root ID, ED 33 mm --------- Ascending aorta ID, A-P, S 34 mm ---------  Left atrium Value Reference LA ID, A-P, ES 42 mm --------- LA ID/bsa, A-P 2.05 cm/m^2 <=2.2 LA volume, S 87 ml --------- LA volume/bsa, S 42.4 ml/m^2 --------- LA volume, ES, 1-p A4C 94 ml --------- LA volume/bsa, ES, 1-p A4C 45.8 ml/m^2 --------- LA volume, ES, 1-p A2C 76 ml --------- LA volume/bsa, ES, 1-p A2C 37 ml/m^2 ---------  Mitral valve Value Reference Mitral E-wave peak velocity 79.5 cm/s --------- Mitral A-wave peak velocity 34.6 cm/s --------- Mitral deceleration time (H) 236 ms 150 - 230 Mitral peak gradient, D 3 mm Hg --------- Mitral E/A ratio, peak 2.3 ---------  Pulmonary arteries Value Reference PA pressure, S, DP (H) 37 mm Hg <=30  Tricuspid valve Value Reference Tricuspid regurg peak velocity 271 cm/s --------- Tricuspid peak RV-RA gradient 29 mm Hg ---------  Systemic veins Value Reference Estimated CVP 8 mm Hg ---------  Right ventricle Value Reference RV pressure, S, DP (H) 37 mm Hg <=30 RV s&', lateral, S 11.1 cm/s ---------  Legend: (L) and (H) mark values outside specified  reference range.  ------------------------------------------------------------------- Prepared and Electronically Authenticated by  Chilton Si, MD 2017-10-23T22:03:06  Right/Left Heart Cath and Coronary Angiography  Conclusion     There is mild (2+) aortic regurgitation.  There is mild (2+) aortic regurgitation.  Dist LAD lesion, 30 %stenosed.  Hemodynamic findings consistent with pulmonary hypertension.   Widely patent left and right coronary systems. Right coronary dominant.  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  Mildly dilated aortic root with trivial to mild peri-valvular aortic regurgitation, non-coronary cusp region.   LV function not assessed.  Normal right heart pressures.  Recommendation:   Follow-up with Dr. Cornelius Moras for now for replacement   Indications   Prosthetic cardiac paravalvular leak, initial encounter [T82.03XA (ICD-10-CM)]  Aortic valve regurgitation, acquired [I35.1 (ICD-10-CM)]  Chronic systolic heart failure (HCC) [I50.22 (ICD-10-CM)]  Procedural Details/Technique   Technical Details The right radial area was sterilely prepped and draped. Intravenous sedation with Versed and fentanyl was administered. 1% Xylocaine was infiltrated to achieve local analgesia. A double wall stick with an angiocath was utilized to obtain intra-arterial access. The modified Seldinger technique was used to place a 35F " Slender" sheath in the right radial artery. Weight based heparin was administered. Coronary angiography was done using 5 F catheters. Right coronary angiography was performed with a JR4. Left ventricular hemodynamic recordings were not made. The prosthetic valve was not crossed retrograde. Left coronary angiography was performed with a JL 3.5 cm. Supravalvular aortography was performed with a 5 French angled pigtail 20 cc/s for a total of 50 cc.  Right heart catheterization was performed by exchanging an  antecubital angiocath IV for a 5 French brachial sheath. 1% Xylocaine local infiltration at the IV site was given prior to sheath exchange. A double glove technique was used. The modified Seldinger technique was employed. After sheath insertion, right heart cath was performed using a 5 French balloon tipped catheter. Pressures were recorded  in each chamber. A main pulmonary artery O2 saturation was obtained. I wedge pressure was also measured.   Hemostasis was achieved using a pneumatic band.  During this procedure the patient is administered a total of Versed 3 mg and Fentanyl 50 mg to achieve and maintain moderate conscious sedation. The patient's heart rate, blood pressure, and oxygen saturation are monitored continuously during the procedure. The period of conscious sedation is 59 minutes, of which I was present face-to-face 100% of this time.   Estimated blood loss <50 mL.  During this procedure the patient was administered the following to achieve and maintain moderate conscious sedation: Versed 3 mg, Fentanyl 50 mcg, while the patient's heart rate, blood pressure, and oxygen saturation were continuously monitored. The period of conscious sedation was 59 minutes, of which I was present face-to-face 100% of this time.    Coronary Findings   Dominance: Right  Left Anterior Descending  Dist LAD lesion, 30% stenosed. 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 No fixed obstructive disease is noted.  Right Heart   Right Heart Pressures Hemodynamic findings consistent with pulmonary hypertension.    Left Heart   Aortic Valve There is mild (2+) aortic regurgitation. A bioprosthetic aortic valve is noted onset of fluoroscopy.    Aorta Aortic Root: The aortic root is enlarged. There is mild (2+) aortic regurgitation.   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    Coronary Diagrams   Diagnostic Diagram      Implants        No implant documentation for this case.  PACS Images   Show images for Cardiac catheterization   Link to Procedure Log   Procedure Log    Hemo Data   Flowsheet Row Most Recent Value  Fick Cardiac Output 4.63 L/min  Fick Cardiac Output Index 2.28 (L/min)/BSA  RA A Wave 11 mmHg  RA V Wave 8 mmHg  RA Mean 6 mmHg  RV Systolic Pressure 41 mmHg  RV Diastolic Pressure 2 mmHg  RV EDP 12 mmHg  PA Systolic Pressure 33 mmHg  PA Diastolic Pressure 13 mmHg  PA Mean 20 mmHg  PW A Wave 22 mmHg  PW V Wave 23 mmHg  PW Mean 13 mmHg  AO Systolic Pressure 105 mmHg  AO Diastolic Pressure 65 mmHg  AO Mean 83 mmHg  QP/QS 1  TPVR Index 8.76 HRUI  TSVR Index 36.36 HRUI  PVR SVR Ratio 0.09  TPVR/TSVR Ratio 0.24     Cardiac CTA  MEDICATIONS: Sub lingual nitro. 4mg  and lopressor 10mg   TECHNIQUE: The patient was scanned on a [employer] 256 slice scanner. Gantry rotation speed was 270 msecs. Collimation was .9mm. A 100 kV prospective scan was triggered in the descending thoracic aorta at 111 HU's with 5% padding centered around 78% of the R-R interval. Average HR during the scan was 58 bpm. The 3D data set was interpreted on a dedicated work station using MPR, MIP and VRT modes. A total of 80cc of contrast was used.  FINDINGS: Non-cardiac: See separate report from [facility]. No significant findings on limited lung and soft tissue windows.  Aortic Valve: Patient has a 21 mm [facility] Ease bovine pericardial tissue valve. There is restricted leaflet motion and the right and non coronary cusps appear fused. The leaflets are not calcified  The struts appear normal with a separation of 16 mm and no "creep". The coronary arteries come off quite low within the  valve especially the LM. They both appear well within the superior margin of the strut cage.  Aorta: Not dilated or aneurysmal. Normal take off of the great vessels. Pseudo-coarctation  of the distal arch with diameter of 18 mm as opposed to 24 mm for arch and descending thoracic aorta  Sinus: 26 mm  STJ: 24 mm  Aortic Root: 30.6 mm  Arch: 24 mm  Descending Thoracic aorta: 24 mm  Coronary Arteries: Right dominant with no anomalies  LM: Normal  LAD: Normal  IM: Large and normal  D1: Normal  D2: Normal  Circumflex: Normal  OM1: Normal  RCA: Right dominant and normal  PDA: Normal  PLA: Normal  IMPRESSION: 1) 21 mm [employer] Ease tissue valve with non calcified restrictive leaflet motion? Fused non/right coronary cusps  2) Normal right dominant coronary arteries  3) Normal aortic root see dimensions above  4) Pseudocoarctation of distal aortic arch  5) Normal take off of great vessels from the arch  6) Coronary ostium very low lying within the existing tissue AVR  [name]   Electronically Signed By: [name] M.D. 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  He describes mild but progressive symptoms of exertional shortness of breath and fatigue consistent with chronic combined systolic and diastolic congestive heart failure, [place] Heart Association functional class I-II.  I have personally reviewed the patient's transthoracic echocardiogram, cardiac catheterization, and cardiac gated CT angiogram. Peak velocity across the aortic valve now measures 4.4 m/s corresponding to mean transvalvular gradient estimated 45 mmHg. Left ventricular systolic function has declined further with ejection fraction estimated at only 30-35%. There remains moderate paravalvular leak but there is also significant AI due to leaflet dysfunction. The patient does not have significant coronary artery disease but the origin of both the  left main and the right coronary arteries are fairly low and relatively close to the sewing ring of the old aortic valve.  There is a possibility that aortic root enlargement, root replacement, and/or coronary artery bypass grafting might be necessary depending upon intraoperative findings.  Risks associated with surgery will be considerable, but the patient remains otherwise healthy and free of comorbid medical problems. I recommend redo replacement.  I favor use of a low profile bileaflet mechanical prosthetic valve.    Plan:  I again reviewed the indications, risks, and potential benefits of surgery at length with the patient and his wife in the office today. Expectations for his postoperative convalescence at been discussed.   Discussion was held comparing the relative risks of mechanical valve replacement with need for lifelong anticoagulation versus use of a bioprosthetic tissue valve and the associated potential for late structural valve deterioration and failure.  This discussion was placed in the context of the patient's particular circumstances, and as a result the patient specifically requests that their valve be replaced using a mechanical valve.   The patient understands and accepts all potential associated risks of surgery including but not limited to risk of death, stroke, myocardial infarction, congestive heart failure, respiratory failure, renal failure, pneumonia, bleeding requiring blood transfusion and or reexploration, arrhythmia, heart block or bradycardia requiring permanent pacemaker, aortic dissection or other major vascular complication, pleural effusions or other delayed complications related to continued congestive heart failure, and other late complications related to valve replacement including structural valve deterioration  and failure, thrombosis, endocarditis, or paravalvular leak.    [name], MD 02/10/2017 9:38 AM

## 2017-02-18 LAB — HEMOGLOBIN A1C
HEMOGLOBIN A1C: 4.9 % (ref 4.8–5.6)
MEAN PLASMA GLUCOSE: 94 mg/dL

## 2017-02-20 MED ORDER — DEXTROSE 5 % IV SOLN
0.0000 ug/min | INTRAVENOUS | Status: DC
  Filled 2017-02-20: qty 4

## 2017-02-20 MED ORDER — VANCOMYCIN HCL 1000 MG IV SOLR
INTRAVENOUS | Status: AC
  Administered 2017-02-21: 10:00:00
  Filled 2017-02-20: qty 1000

## 2017-02-20 MED ORDER — MAGNESIUM SULFATE 50 % IJ SOLN
40.0000 meq | INTRAMUSCULAR | Status: DC
  Filled 2017-02-20: qty 10

## 2017-02-20 MED ORDER — SODIUM CHLORIDE 0.9 % IV SOLN
30.0000 ug/min | INTRAVENOUS | Status: AC
  Administered 2017-02-21: 20 ug/min via INTRAVENOUS
  Filled 2017-02-20: qty 2

## 2017-02-20 MED ORDER — SODIUM CHLORIDE 0.9 % IV SOLN
INTRAVENOUS | Status: DC
  Filled 2017-02-20: qty 30

## 2017-02-20 MED ORDER — SODIUM CHLORIDE 0.9 % IV SOLN
INTRAVENOUS | Status: AC
  Administered 2017-02-21: .9 [IU]/h via INTRAVENOUS
  Filled 2017-02-20: qty 2.5

## 2017-02-20 MED ORDER — POTASSIUM CHLORIDE 2 MEQ/ML IV SOLN
80.0000 meq | INTRAVENOUS | Status: DC
  Filled 2017-02-20: qty 40

## 2017-02-20 MED ORDER — TRANEXAMIC ACID 1000 MG/10ML IV SOLN
1.5000 mg/kg/h | INTRAVENOUS | Status: AC
  Administered 2017-02-21: 1.5 mg/kg/h via INTRAVENOUS
  Filled 2017-02-20: qty 25

## 2017-02-20 MED ORDER — PLASMA-LYTE 148 IV SOLN
INTRAVENOUS | Status: AC
  Administered 2017-02-21: 500 mL
  Filled 2017-02-20: qty 2.5

## 2017-02-20 MED ORDER — CHLORHEXIDINE GLUCONATE 0.12 % MT SOLN
15.0000 mL | Freq: Once | OROMUCOSAL | Status: AC
  Administered 2017-02-21: 15 mL via OROMUCOSAL
  Filled 2017-02-20: qty 15

## 2017-02-20 MED ORDER — VANCOMYCIN HCL 10 G IV SOLR
1250.0000 mg | INTRAVENOUS | Status: AC
  Administered 2017-02-21: 1250 mg via INTRAVENOUS
  Filled 2017-02-20: qty 1250

## 2017-02-20 MED ORDER — DEXTROSE 5 % IV SOLN
750.0000 mg | INTRAVENOUS | Status: DC
  Filled 2017-02-20: qty 750

## 2017-02-20 MED ORDER — TRANEXAMIC ACID (OHS) PUMP PRIME SOLUTION
2.0000 mg/kg | INTRAVENOUS | Status: DC
  Filled 2017-02-20: qty 1.64

## 2017-02-20 MED ORDER — DEXTROSE 5 % IV SOLN
1.5000 g | INTRAVENOUS | Status: AC
  Administered 2017-02-21: .75 g via INTRAVENOUS
  Administered 2017-02-21: 1.5 g via INTRAVENOUS
  Filled 2017-02-20: qty 1.5

## 2017-02-20 MED ORDER — DOPAMINE-DEXTROSE 3.2-5 MG/ML-% IV SOLN
0.0000 ug/kg/min | INTRAVENOUS | Status: DC
  Filled 2017-02-20: qty 250

## 2017-02-20 MED ORDER — DEXMEDETOMIDINE HCL IN NACL 400 MCG/100ML IV SOLN
0.1000 ug/kg/h | INTRAVENOUS | Status: AC
  Administered 2017-02-21: .3 ug/kg/h via INTRAVENOUS
  Filled 2017-02-20: qty 100

## 2017-02-20 MED ORDER — METOPROLOL TARTRATE 12.5 MG HALF TABLET: 12.5000 mg | ORAL_TABLET | Freq: Once | ORAL | Status: DC

## 2017-02-20 MED ORDER — TRANEXAMIC ACID (OHS) BOLUS VIA INFUSION
15.0000 mg/kg | INTRAVENOUS | Status: AC
  Administered 2017-02-21: 1228.5 mg via INTRAVENOUS
  Filled 2017-02-20: qty 1229

## 2017-02-20 MED ORDER — NITROGLYCERIN IN D5W 200-5 MCG/ML-% IV SOLN
2.0000 ug/min | INTRAVENOUS | Status: DC
  Filled 2017-02-20: qty 250

## 2017-02-20 NOTE — Anesthesia Preprocedure Evaluation (Addendum)
Anesthesia Evaluation  Patient identified by MRN, date of birth, ID band Patient awake    Reviewed: Allergy & Precautions, H&P , NPO status , Patient's Chart, lab work & pertinent test results  Airway Mallampati: III  TM Distance: >3 FB Neck ROM: Full    Dental no notable dental hx. (+) Teeth Intact, Dental Advisory Given   Pulmonary shortness of breath,    Pulmonary exam normal breath sounds clear to auscultation       Cardiovascular Exercise Tolerance: Good + Valvular Problems/Murmurs AS and AI  Rhythm:Regular Rate:Normal  S/p AVR   Neuro/Psych Depression negative neurological ROS  negative psych ROS   GI/Hepatic negative GI ROS, Neg liver ROS,   Endo/Other  negative endocrine ROS  Renal/GU negative Renal ROS  negative genitourinary   Musculoskeletal   Abdominal   Peds  Hematology negative hematology ROS (+)   Anesthesia Other Findings   Reproductive/Obstetrics negative OB ROS                            Anesthesia Physical Anesthesia Plan  ASA: IV  Anesthesia Plan: General   Post-op Pain Management:    Induction: Intravenous  Airway Management Planned: Oral ETT  Additional Equipment: Arterial line, CVP, PA Cath, TEE and Ultrasound Guidance Line Placement  Intra-op Plan:   Post-operative Plan: Post-operative intubation/ventilation  Informed Consent: I have reviewed the patients History and Physical, chart, labs and discussed the procedure including the risks, benefits and alternatives for the proposed anesthesia with the patient or authorized representative who has indicated his/her understanding and acceptance.   Dental advisory given  Plan Discussed with: CRNA  Anesthesia Plan Comments:         Anesthesia Quick Evaluation

## 2017-02-21 ENCOUNTER — Inpatient Hospital Stay (HOSPITAL_COMMUNITY): Payer: BLUE CROSS/BLUE SHIELD

## 2017-02-21 ENCOUNTER — Encounter (HOSPITAL_COMMUNITY): Payer: Self-pay | Admitting: *Deleted

## 2017-02-21 ENCOUNTER — Inpatient Hospital Stay (HOSPITAL_COMMUNITY): Payer: BLUE CROSS/BLUE SHIELD | Admitting: Anesthesiology

## 2017-02-21 ENCOUNTER — Inpatient Hospital Stay (HOSPITAL_COMMUNITY)
Admission: RE | Admit: 2017-02-21 | Discharge: 2017-02-27 | DRG: 219 | Disposition: A | Discharge status: Home / Self Care | Payer: BLUE CROSS/BLUE SHIELD | Source: Ambulatory Visit | Attending: Thoracic Surgery (Cardiothoracic Vascular Surgery) | Admitting: Thoracic Surgery (Cardiothoracic Vascular Surgery)

## 2017-02-21 ENCOUNTER — Encounter (HOSPITAL_COMMUNITY)
Admission: RE | Disposition: A | Discharge status: Home / Self Care | Payer: Self-pay | Source: Ambulatory Visit | Attending: Thoracic Surgery (Cardiothoracic Vascular Surgery)

## 2017-02-21 DIAGNOSIS — D62 Acute posthemorrhagic anemia: Secondary | ICD-10-CM | POA: Diagnosis not present

## 2017-02-21 DIAGNOSIS — R197 Diarrhea, unspecified: Secondary | ICD-10-CM | POA: Diagnosis not present

## 2017-02-21 DIAGNOSIS — Z7982 Long term (current) use of aspirin: Secondary | ICD-10-CM

## 2017-02-21 DIAGNOSIS — I5040 Unspecified combined systolic (congestive) and diastolic (congestive) heart failure: Secondary | ICD-10-CM | POA: Diagnosis present

## 2017-02-21 DIAGNOSIS — Z4682 Encounter for fitting and adjustment of non-vascular catheter: Secondary | ICD-10-CM | POA: Diagnosis not present

## 2017-02-21 DIAGNOSIS — I351 Nonrheumatic aortic (valve) insufficiency: Secondary | ICD-10-CM | POA: Diagnosis present

## 2017-02-21 DIAGNOSIS — K219 Gastro-esophageal reflux disease without esophagitis: Secondary | ICD-10-CM | POA: Diagnosis present

## 2017-02-21 DIAGNOSIS — Z79899 Other long term (current) drug therapy: Secondary | ICD-10-CM | POA: Diagnosis not present

## 2017-02-21 DIAGNOSIS — D6959 Other secondary thrombocytopenia: Secondary | ICD-10-CM | POA: Diagnosis present

## 2017-02-21 DIAGNOSIS — I5043 Acute on chronic combined systolic (congestive) and diastolic (congestive) heart failure: Secondary | ICD-10-CM | POA: Diagnosis present

## 2017-02-21 DIAGNOSIS — J9811 Atelectasis: Secondary | ICD-10-CM | POA: Diagnosis not present

## 2017-02-21 DIAGNOSIS — Z951 Presence of aortocoronary bypass graft: Secondary | ICD-10-CM

## 2017-02-21 DIAGNOSIS — I272 Pulmonary hypertension, unspecified: Secondary | ICD-10-CM | POA: Diagnosis present

## 2017-02-21 DIAGNOSIS — I350 Nonrheumatic aortic (valve) stenosis: Secondary | ICD-10-CM | POA: Diagnosis not present

## 2017-02-21 DIAGNOSIS — R0602 Shortness of breath: Secondary | ICD-10-CM | POA: Diagnosis not present

## 2017-02-21 DIAGNOSIS — Y838 Other surgical procedures as the cause of abnormal reaction of the patient, or of later complication, without mention of misadventure at the time of the procedure: Secondary | ICD-10-CM | POA: Diagnosis present

## 2017-02-21 DIAGNOSIS — F329 Major depressive disorder, single episode, unspecified: Secondary | ICD-10-CM | POA: Diagnosis not present

## 2017-02-21 DIAGNOSIS — I352 Nonrheumatic aortic (valve) stenosis with insufficiency: Secondary | ICD-10-CM | POA: Diagnosis present

## 2017-02-21 DIAGNOSIS — J90 Pleural effusion, not elsewhere classified: Secondary | ICD-10-CM | POA: Diagnosis not present

## 2017-02-21 DIAGNOSIS — I2510 Atherosclerotic heart disease of native coronary artery without angina pectoris: Secondary | ICD-10-CM | POA: Diagnosis not present

## 2017-02-21 DIAGNOSIS — I493 Ventricular premature depolarization: Secondary | ICD-10-CM | POA: Diagnosis not present

## 2017-02-21 DIAGNOSIS — D696 Thrombocytopenia, unspecified: Secondary | ICD-10-CM | POA: Diagnosis present

## 2017-02-21 DIAGNOSIS — H9190 Unspecified hearing loss, unspecified ear: Secondary | ICD-10-CM | POA: Diagnosis not present

## 2017-02-21 DIAGNOSIS — Z952 Presence of prosthetic heart valve: Secondary | ICD-10-CM

## 2017-02-21 DIAGNOSIS — R002 Palpitations: Secondary | ICD-10-CM | POA: Diagnosis not present

## 2017-02-21 DIAGNOSIS — I358 Other nonrheumatic aortic valve disorders: Secondary | ICD-10-CM | POA: Diagnosis not present

## 2017-02-21 DIAGNOSIS — Z0389 Encounter for observation for other suspected diseases and conditions ruled out: Secondary | ICD-10-CM | POA: Diagnosis not present

## 2017-02-21 DIAGNOSIS — I420 Dilated cardiomyopathy: Secondary | ICD-10-CM | POA: Diagnosis not present

## 2017-02-21 DIAGNOSIS — T8209XA Other mechanical complication of heart valve prosthesis, initial encounter: Secondary | ICD-10-CM | POA: Diagnosis present

## 2017-02-21 DIAGNOSIS — Z953 Presence of xenogenic heart valve: Secondary | ICD-10-CM

## 2017-02-21 DIAGNOSIS — T8203XA Leakage of heart valve prosthesis, initial encounter: Principal | ICD-10-CM | POA: Diagnosis present

## 2017-02-21 DIAGNOSIS — Z954 Presence of other heart-valve replacement: Secondary | ICD-10-CM

## 2017-02-21 HISTORY — DX: Thrombocytopenia, unspecified: D69.6

## 2017-02-21 HISTORY — PX: CORONARY ARTERY BYPASS GRAFT: SHX141

## 2017-02-21 HISTORY — DX: Presence of aortocoronary bypass graft: Z95.1

## 2017-02-21 HISTORY — DX: Presence of other heart-valve replacement: Z95.4

## 2017-02-21 HISTORY — PX: AORTIC VALVE REPLACEMENT: SHX41

## 2017-02-21 HISTORY — PX: TEE WITHOUT CARDIOVERSION: SHX5443

## 2017-02-21 LAB — POCT I-STAT 3, ART BLOOD GAS (G3+)
ACID-BASE DEFICIT: 1 mmol/L (ref 0.0–2.0)
ACID-BASE EXCESS: 5 mmol/L — AB (ref 0.0–2.0)
Acid-Base Excess: 1 mmol/L (ref 0.0–2.0)
Acid-base deficit: 2 mmol/L (ref 0.0–2.0)
Acid-base deficit: 3 mmol/L — ABNORMAL HIGH (ref 0.0–2.0)
BICARBONATE: 23 mmol/L (ref 20.0–28.0)
BICARBONATE: 24.9 mmol/L (ref 20.0–28.0)
BICARBONATE: 25.3 mmol/L (ref 20.0–28.0)
Bicarbonate: 26.7 mmol/L (ref 20.0–28.0)
Bicarbonate: 27.8 mmol/L (ref 20.0–28.0)
Bicarbonate: 22.5 mmol/L (ref 20.0–28.0)
O2 SAT: 100 %
O2 SAT: 91 %
O2 SAT: 100 %
O2 SAT: 98 %
O2 Saturation: 100 %
O2 Saturation: 91 %
PCO2 ART: 47.9 mmHg (ref 32.0–48.0)
PCO2 ART: 32.7 mmHg (ref 32.0–48.0)
PCO2 ART: 39 mmHg (ref 32.0–48.0)
PH ART: 7.347 — AB (ref 7.350–7.450)
PH ART: 7.369 (ref 7.350–7.450)
PO2 ART: 63 mmHg — AB (ref 83.0–108.0)
PO2 ART: 65 mmHg — AB (ref 83.0–108.0)
PO2 ART: 299 mmHg — AB (ref 83.0–108.0)
PO2 ART: 107 mmHg (ref 83.0–108.0)
TCO2: 28 mmol/L (ref 0–100)
TCO2: 29 mmol/L (ref 0–100)
TCO2: 24 mmol/L (ref 0–100)
TCO2: 24 mmol/L (ref 0–100)
TCO2: 26 mmol/L (ref 0–100)
TCO2: 27 mmol/L (ref 0–100)
pCO2 arterial: 41.9 mmHg (ref 32.0–48.0)
pCO2 arterial: 48.1 mmHg — ABNORMAL HIGH (ref 32.0–48.0)
pCO2 arterial: 41.9 mmHg (ref 32.0–48.0)
pH, Arterial: 7.354 (ref 7.350–7.450)
pH, Arterial: 7.539 — ABNORMAL HIGH (ref 7.350–7.450)
pH, Arterial: 7.323 — ABNORMAL LOW (ref 7.350–7.450)
pH, Arterial: 7.386 (ref 7.350–7.450)
pO2, Arterial: 412 mmHg — ABNORMAL HIGH (ref 83.0–108.0)
pO2, Arterial: 384 mmHg — ABNORMAL HIGH (ref 83.0–108.0)

## 2017-02-21 LAB — POCT I-STAT, CHEM 8
BUN: 14 mg/dL (ref 6–20)
BUN: 12 mg/dL (ref 6–20)
BUN: 14 mg/dL (ref 6–20)
BUN: 12 mg/dL (ref 6–20)
BUN: 12 mg/dL (ref 6–20)
BUN: 11 mg/dL (ref 6–20)
BUN: 11 mg/dL (ref 6–20)
BUN: 9 mg/dL (ref 6–20)
BUN: 8 mg/dL (ref 6–20)
CALCIUM ION: 1.24 mmol/L (ref 1.15–1.40)
CALCIUM ION: 0.99 mmol/L — AB (ref 1.15–1.40)
CALCIUM ION: 1.21 mmol/L (ref 1.15–1.40)
CALCIUM ION: 1.13 mmol/L — AB (ref 1.15–1.40)
CALCIUM ION: 1.09 mmol/L — AB (ref 1.15–1.40)
CHLORIDE: 104 mmol/L (ref 101–111)
CHLORIDE: 105 mmol/L (ref 101–111)
CHLORIDE: 103 mmol/L (ref 101–111)
CHLORIDE: 103 mmol/L (ref 101–111)
CHLORIDE: 106 mmol/L (ref 101–111)
CHLORIDE: 105 mmol/L (ref 101–111)
CHLORIDE: 106 mmol/L (ref 101–111)
CREATININE: 0.6 mg/dL — AB (ref 0.61–1.24)
Calcium, Ion: 1.1 mmol/L — ABNORMAL LOW (ref 1.15–1.40)
Calcium, Ion: 1.02 mmol/L — ABNORMAL LOW (ref 1.15–1.40)
Calcium, Ion: 1.09 mmol/L — ABNORMAL LOW (ref 1.15–1.40)
Calcium, Ion: 0.81 mmol/L — CL (ref 1.15–1.40)
Chloride: 102 mmol/L (ref 101–111)
Chloride: 102 mmol/L (ref 101–111)
Creatinine, Ser: 0.7 mg/dL (ref 0.61–1.24)
Creatinine, Ser: 0.4 mg/dL — ABNORMAL LOW (ref 0.61–1.24)
Creatinine, Ser: 0.6 mg/dL — ABNORMAL LOW (ref 0.61–1.24)
Creatinine, Ser: 0.6 mg/dL — ABNORMAL LOW (ref 0.61–1.24)
Creatinine, Ser: 0.5 mg/dL — ABNORMAL LOW (ref 0.61–1.24)
Creatinine, Ser: 0.6 mg/dL — ABNORMAL LOW (ref 0.61–1.24)
Creatinine, Ser: 0.5 mg/dL — ABNORMAL LOW (ref 0.61–1.24)
Creatinine, Ser: 0.7 mg/dL (ref 0.61–1.24)
GLUCOSE: 110 mg/dL — AB (ref 65–99)
GLUCOSE: 98 mg/dL (ref 65–99)
GLUCOSE: 131 mg/dL — AB (ref 65–99)
Glucose, Bld: 104 mg/dL — ABNORMAL HIGH (ref 65–99)
Glucose, Bld: 151 mg/dL — ABNORMAL HIGH (ref 65–99)
Glucose, Bld: 146 mg/dL — ABNORMAL HIGH (ref 65–99)
Glucose, Bld: 126 mg/dL — ABNORMAL HIGH (ref 65–99)
Glucose, Bld: 123 mg/dL — ABNORMAL HIGH (ref 65–99)
Glucose, Bld: 118 mg/dL — ABNORMAL HIGH (ref 65–99)
HCT: 35 % — ABNORMAL LOW (ref 39.0–52.0)
HCT: 29 % — ABNORMAL LOW (ref 39.0–52.0)
HCT: 35 % — ABNORMAL LOW (ref 39.0–52.0)
HCT: 29 % — ABNORMAL LOW (ref 39.0–52.0)
HCT: 23 % — ABNORMAL LOW (ref 39.0–52.0)
HCT: 25 % — ABNORMAL LOW (ref 39.0–52.0)
HEMATOCRIT: 26 % — AB (ref 39.0–52.0)
HEMATOCRIT: 28 % — AB (ref 39.0–52.0)
HEMATOCRIT: 27 % — AB (ref 39.0–52.0)
HEMOGLOBIN: 9.9 g/dL — AB (ref 13.0–17.0)
HEMOGLOBIN: 8.8 g/dL — AB (ref 13.0–17.0)
HEMOGLOBIN: 7.8 g/dL — AB (ref 13.0–17.0)
HEMOGLOBIN: 9.2 g/dL — AB (ref 13.0–17.0)
Hemoglobin: 11.9 g/dL — ABNORMAL LOW (ref 13.0–17.0)
Hemoglobin: 11.9 g/dL — ABNORMAL LOW (ref 13.0–17.0)
Hemoglobin: 9.9 g/dL — ABNORMAL LOW (ref 13.0–17.0)
Hemoglobin: 9.5 g/dL — ABNORMAL LOW (ref 13.0–17.0)
Hemoglobin: 8.5 g/dL — ABNORMAL LOW (ref 13.0–17.0)
POTASSIUM: 3.9 mmol/L (ref 3.5–5.1)
POTASSIUM: 3.8 mmol/L (ref 3.5–5.1)
Potassium: 4 mmol/L (ref 3.5–5.1)
Potassium: 3.9 mmol/L (ref 3.5–5.1)
Potassium: 3.9 mmol/L (ref 3.5–5.1)
Potassium: 4.2 mmol/L (ref 3.5–5.1)
Potassium: 4.3 mmol/L (ref 3.5–5.1)
Potassium: 4.2 mmol/L (ref 3.5–5.1)
Potassium: 4 mmol/L (ref 3.5–5.1)
SODIUM: 143 mmol/L (ref 135–145)
SODIUM: 141 mmol/L (ref 135–145)
SODIUM: 139 mmol/L (ref 135–145)
SODIUM: 142 mmol/L (ref 135–145)
SODIUM: 142 mmol/L (ref 135–145)
Sodium: 141 mmol/L (ref 135–145)
Sodium: 141 mmol/L (ref 135–145)
Sodium: 135 mmol/L (ref 135–145)
Sodium: 143 mmol/L (ref 135–145)
TCO2: 28 mmol/L (ref 0–100)
TCO2: 27 mmol/L (ref 0–100)
TCO2: 31 mmol/L (ref 0–100)
TCO2: 29 mmol/L (ref 0–100)
TCO2: 28 mmol/L (ref 0–100)
TCO2: 28 mmol/L (ref 0–100)
TCO2: 29 mmol/L (ref 0–100)
TCO2: 26 mmol/L (ref 0–100)
TCO2: 25 mmol/L (ref 0–100)

## 2017-02-21 LAB — MAGNESIUM: MAGNESIUM: 2.8 mg/dL — AB (ref 1.7–2.4)

## 2017-02-21 LAB — PREPARE RBC (CROSSMATCH)

## 2017-02-21 LAB — CBC
HEMATOCRIT: 29.3 % — AB (ref 39.0–52.0)
HEMATOCRIT: 28 % — AB (ref 39.0–52.0)
HEMOGLOBIN: 10.5 g/dL — AB (ref 13.0–17.0)
HEMOGLOBIN: 9.8 g/dL — AB (ref 13.0–17.0)
MCH: 30.7 pg (ref 26.0–34.0)
MCH: 29.8 pg (ref 26.0–34.0)
MCHC: 35.8 g/dL (ref 30.0–36.0)
MCHC: 35 g/dL (ref 30.0–36.0)
MCV: 85.7 fL (ref 78.0–100.0)
MCV: 85.1 fL (ref 78.0–100.0)
Platelets: 113 10*3/uL — ABNORMAL LOW (ref 150–400)
Platelets: 88 10*3/uL — ABNORMAL LOW (ref 150–400)
RBC: 3.42 MIL/uL — AB (ref 4.22–5.81)
RBC: 3.29 MIL/uL — ABNORMAL LOW (ref 4.22–5.81)
RDW: 12.7 % (ref 11.5–15.5)
RDW: 12.5 % (ref 11.5–15.5)
WBC: 14.7 10*3/uL — ABNORMAL HIGH (ref 4.0–10.5)
WBC: 7.7 10*3/uL (ref 4.0–10.5)

## 2017-02-21 LAB — GLUCOSE, CAPILLARY
GLUCOSE-CAPILLARY: 104 mg/dL — AB (ref 65–99)
GLUCOSE-CAPILLARY: 103 mg/dL — AB (ref 65–99)
Glucose-Capillary: 111 mg/dL — ABNORMAL HIGH (ref 65–99)
Glucose-Capillary: 108 mg/dL — ABNORMAL HIGH (ref 65–99)
Glucose-Capillary: 106 mg/dL — ABNORMAL HIGH (ref 65–99)
Glucose-Capillary: 125 mg/dL — ABNORMAL HIGH (ref 65–99)

## 2017-02-21 LAB — HEMOGLOBIN AND HEMATOCRIT, BLOOD
HCT: 28.5 % — ABNORMAL LOW (ref 39.0–52.0)
HCT: 27.6 % — ABNORMAL LOW (ref 39.0–52.0)
Hemoglobin: 10.3 g/dL — ABNORMAL LOW (ref 13.0–17.0)
Hemoglobin: 9.9 g/dL — ABNORMAL LOW (ref 13.0–17.0)

## 2017-02-21 LAB — POCT I-STAT 4, (NA,K, GLUC, HGB,HCT)
Glucose, Bld: 109 mg/dL — ABNORMAL HIGH (ref 65–99)
HCT: 26 % — ABNORMAL LOW (ref 39.0–52.0)
HEMOGLOBIN: 8.8 g/dL — AB (ref 13.0–17.0)
Potassium: 4.1 mmol/L (ref 3.5–5.1)
Sodium: 143 mmol/L (ref 135–145)

## 2017-02-21 LAB — PROTIME-INR
INR: 1.62
Prothrombin Time: 19.4 seconds — ABNORMAL HIGH (ref 11.4–15.2)

## 2017-02-21 LAB — PLATELET COUNT
PLATELETS: 75 10*3/uL — AB (ref 150–400)
Platelets: 104 10*3/uL — ABNORMAL LOW (ref 150–400)

## 2017-02-21 LAB — FIBRINOGEN: Fibrinogen: 109 mg/dL — ABNORMAL LOW (ref 210–475)

## 2017-02-21 LAB — CREATININE, SERUM: Creatinine, Ser: 0.75 mg/dL (ref 0.61–1.24)

## 2017-02-21 LAB — APTT: aPTT: 39 seconds — ABNORMAL HIGH (ref 24–36)

## 2017-02-21 SURGERY — REDO AORTIC VALVE REPLACEMENT (AVR)
Anesthesia: General | Site: Chest

## 2017-02-21 MED ORDER — LACTATED RINGERS IV SOLN
INTRAVENOUS | Status: DC | PRN
Start: 2017-02-21 — End: 2017-02-21
  Administered 2017-02-21 (×2): via INTRAVENOUS

## 2017-02-21 MED ORDER — SODIUM CHLORIDE 0.9 % IV SOLN: Freq: Once | INTRAVENOUS | Status: DC

## 2017-02-21 MED ORDER — ROCURONIUM BROMIDE 50 MG/5ML IV SOSY
PREFILLED_SYRINGE | INTRAVENOUS | Status: AC
Start: 2017-02-21 — End: 2017-02-21
  Filled 2017-02-21: qty 10

## 2017-02-21 MED ORDER — FAMOTIDINE IN NACL 20-0.9 MG/50ML-% IV SOLN
20.0000 mg | Freq: Two times a day (BID) | INTRAVENOUS | Status: AC
  Administered 2017-02-21: 20 mg via INTRAVENOUS
  Filled 2017-02-21: qty 50

## 2017-02-21 MED ORDER — DEXMEDETOMIDINE HCL IN NACL 200 MCG/50ML IV SOLN
0.4000 ug/kg/h | INTRAVENOUS | Status: DC
  Filled 2017-02-21: qty 50

## 2017-02-21 MED ORDER — MAGNESIUM SULFATE 4 GM/100ML IV SOLN
4.0000 g | Freq: Once | INTRAVENOUS | Status: AC
  Administered 2017-02-21: 4 g via INTRAVENOUS
  Filled 2017-02-21: qty 100

## 2017-02-21 MED ORDER — MIDAZOLAM HCL 5 MG/5ML IJ SOLN
INTRAMUSCULAR | Status: DC | PRN
  Administered 2017-02-21 (×2): 3 mg via INTRAVENOUS
  Administered 2017-02-21: 1 mg via INTRAVENOUS
  Administered 2017-02-21: 2 mg via INTRAVENOUS
  Administered 2017-02-21: 1 mg via INTRAVENOUS

## 2017-02-21 MED ORDER — SODIUM CHLORIDE 0.9 % IJ SOLN
OROMUCOSAL | Status: DC | PRN
  Administered 2017-02-21 (×2): via TOPICAL

## 2017-02-21 MED ORDER — SODIUM CHLORIDE 0.9 % IJ SOLN
OROMUCOSAL | Status: DC | PRN
  Administered 2017-02-21 (×2): via TOPICAL

## 2017-02-21 MED ORDER — EPINEPHRINE PF 1 MG/ML IJ SOLN
0.5000 ug/min | INTRAVENOUS | Status: DC
  Filled 2017-02-21: qty 4

## 2017-02-21 MED ORDER — VECURONIUM BROMIDE 10 MG IV SOLR
INTRAVENOUS | Status: AC
  Filled 2017-02-21: qty 20

## 2017-02-21 MED ORDER — DEXMEDETOMIDINE HCL IN NACL 200 MCG/50ML IV SOLN
INTRAVENOUS | Status: AC
  Filled 2017-02-21: qty 50

## 2017-02-21 MED ORDER — ASPIRIN 81 MG PO CHEW: 324.0000 mg | CHEWABLE_TABLET | Freq: Every day | ORAL | Status: DC

## 2017-02-21 MED ORDER — MORPHINE SULFATE (PF) 2 MG/ML IV SOLN
1.0000 mg | INTRAVENOUS | Status: DC | PRN
  Administered 2017-02-21 – 2017-02-23 (×6): 2 mg via INTRAVENOUS
  Filled 2017-02-21 (×7): qty 1

## 2017-02-21 MED ORDER — MIDAZOLAM HCL 2 MG/2ML IJ SOLN
2.0000 mg | INTRAMUSCULAR | Status: DC | PRN
  Administered 2017-02-21: 2 mg via INTRAVENOUS
  Filled 2017-02-21: qty 2

## 2017-02-21 MED ORDER — LACTATED RINGERS IV SOLN
INTRAVENOUS | Status: DC
  Administered 2017-02-22: 01:00:00 via INTRAVENOUS

## 2017-02-21 MED ORDER — ORAL CARE MOUTH RINSE
15.0000 mL | Freq: Four times a day (QID) | OROMUCOSAL | Status: DC
  Administered 2017-02-22 (×2): 15 mL via OROMUCOSAL

## 2017-02-21 MED ORDER — ONDANSETRON HCL 4 MG/2ML IJ SOLN
INTRAMUSCULAR | Status: AC
  Filled 2017-02-21: qty 2

## 2017-02-21 MED ORDER — SODIUM CHLORIDE 0.45 % IV SOLN: INTRAVENOUS | Status: DC | PRN

## 2017-02-21 MED ORDER — HEPARIN SODIUM (PORCINE) 1000 UNIT/ML IJ SOLN
INTRAMUSCULAR | Status: AC
  Filled 2017-02-21: qty 1

## 2017-02-21 MED ORDER — MIDAZOLAM HCL 10 MG/2ML IJ SOLN
INTRAMUSCULAR | Status: AC
  Filled 2017-02-21: qty 2

## 2017-02-21 MED ORDER — TRANEXAMIC ACID 1000 MG/10ML IV SOLN
INTRAVENOUS | Status: DC
  Filled 2017-02-21: qty 100

## 2017-02-21 MED ORDER — MILRINONE LACTATE IN DEXTROSE 20-5 MG/100ML-% IV SOLN
0.1250 ug/kg/min | INTRAVENOUS | Status: AC
  Administered 2017-02-21: .3 ug/kg/min via INTRAVENOUS
  Filled 2017-02-21: qty 100

## 2017-02-21 MED ORDER — DOCUSATE SODIUM 100 MG PO CAPS
200.0000 mg | ORAL_CAPSULE | Freq: Every day | ORAL | Status: DC
  Administered 2017-02-22 – 2017-02-23 (×2): 200 mg via ORAL
  Filled 2017-02-21 (×4): qty 2

## 2017-02-21 MED ORDER — SODIUM CHLORIDE 0.9% FLUSH: 3.0000 mL | INTRAVENOUS | Status: DC | PRN

## 2017-02-21 MED ORDER — FENTANYL CITRATE (PF) 250 MCG/5ML IJ SOLN
INTRAMUSCULAR | Status: AC
  Filled 2017-02-21: qty 20

## 2017-02-21 MED ORDER — ETOMIDATE 2 MG/ML IV SOLN
INTRAVENOUS | Status: AC
  Filled 2017-02-21: qty 10

## 2017-02-21 MED ORDER — MILRINONE LACTATE IN DEXTROSE 20-5 MG/100ML-% IV SOLN
0.0000 ug/kg/min | INTRAVENOUS | Status: DC
  Administered 2017-02-22: 0.3 ug/kg/min via INTRAVENOUS
  Filled 2017-02-21: qty 100

## 2017-02-21 MED ORDER — BISACODYL 10 MG RE SUPP: 10.0000 mg | Freq: Every day | RECTAL | Status: DC

## 2017-02-21 MED ORDER — SODIUM CHLORIDE 0.9 % IV SOLN
INTRAVENOUS | Status: DC
  Administered 2017-02-21: 1.9 [IU]/h via INTRAVENOUS
  Filled 2017-02-21 (×2): qty 2.5

## 2017-02-21 MED ORDER — CHLORHEXIDINE GLUCONATE 4 % EX LIQD: 30.0000 mL | CUTANEOUS | Status: DC

## 2017-02-21 MED ORDER — PROPOFOL 10 MG/ML IV BOLUS
INTRAVENOUS | Status: AC
  Filled 2017-02-21: qty 20

## 2017-02-21 MED ORDER — MORPHINE SULFATE (PF) 2 MG/ML IV SOLN
1.0000 mg | INTRAVENOUS | Status: DC | PRN
  Administered 2017-02-21 (×2): 2 mg via INTRAVENOUS
  Administered 2017-02-22: 1 mg via INTRAVENOUS
  Filled 2017-02-21 (×3): qty 1

## 2017-02-21 MED ORDER — CHLORHEXIDINE GLUCONATE 0.12 % MT SOLN
15.0000 mL | OROMUCOSAL | Status: AC
  Administered 2017-02-21: 15 mL via OROMUCOSAL

## 2017-02-21 MED ORDER — VECURONIUM BROMIDE 10 MG IV SOLR
INTRAVENOUS | Status: AC
  Filled 2017-02-21: qty 10

## 2017-02-21 MED ORDER — ALBUMIN HUMAN 5 % IV SOLN: 250.0000 mL | INTRAVENOUS | Status: AC | PRN

## 2017-02-21 MED ORDER — ASPIRIN EC 325 MG PO TBEC
325.0000 mg | DELAYED_RELEASE_TABLET | Freq: Every day | ORAL | Status: DC
  Administered 2017-02-22: 325 mg via ORAL
  Filled 2017-02-21: qty 1

## 2017-02-21 MED ORDER — SODIUM CHLORIDE 0.9 % IJ SOLN
OROMUCOSAL | Status: DC | PRN
  Administered 2017-02-21: 09:00:00 via TOPICAL

## 2017-02-21 MED ORDER — PROPOFOL 10 MG/ML IV BOLUS
INTRAVENOUS | Status: DC | PRN
  Administered 2017-02-21: 50 mg via INTRAVENOUS

## 2017-02-21 MED ORDER — VECURONIUM BROMIDE 10 MG IV SOLR
INTRAVENOUS | Status: DC | PRN
  Administered 2017-02-21 (×6): 5 mg via INTRAVENOUS
  Administered 2017-02-21: 10 mg via INTRAVENOUS

## 2017-02-21 MED ORDER — ONDANSETRON HCL 4 MG/2ML IJ SOLN
4.0000 mg | Freq: Four times a day (QID) | INTRAMUSCULAR | Status: DC | PRN
  Administered 2017-02-22: 4 mg via INTRAVENOUS
  Filled 2017-02-21: qty 2

## 2017-02-21 MED ORDER — NOREPINEPHRINE BITARTRATE 1 MG/ML IV SOLN
0.0000 ug/min | INTRAVENOUS | Status: DC
  Filled 2017-02-21: qty 4

## 2017-02-21 MED ORDER — FENTANYL CITRATE (PF) 250 MCG/5ML IJ SOLN
INTRAMUSCULAR | Status: DC | PRN
  Administered 2017-02-21: 100 ug via INTRAVENOUS
  Administered 2017-02-21: 50 ug via INTRAVENOUS
  Administered 2017-02-21: 150 ug via INTRAVENOUS
  Administered 2017-02-21: 700 ug via INTRAVENOUS
  Administered 2017-02-21: 50 ug via INTRAVENOUS
  Administered 2017-02-21 (×2): 100 ug via INTRAVENOUS

## 2017-02-21 MED ORDER — PROTAMINE SULFATE 10 MG/ML IV SOLN
INTRAVENOUS | Status: AC
  Filled 2017-02-21: qty 25

## 2017-02-21 MED ORDER — ACETAMINOPHEN 160 MG/5ML PO SOLN: 650.0000 mg | Freq: Once | ORAL | Status: AC

## 2017-02-21 MED ORDER — BISACODYL 5 MG PO TBEC
10.0000 mg | DELAYED_RELEASE_TABLET | Freq: Every day | ORAL | Status: DC
  Administered 2017-02-22 – 2017-02-23 (×2): 10 mg via ORAL
  Filled 2017-02-21 (×3): qty 2

## 2017-02-21 MED ORDER — PROTAMINE SULFATE 10 MG/ML IV SOLN
INTRAVENOUS | Status: DC | PRN
  Administered 2017-02-21: 200 mg via INTRAVENOUS

## 2017-02-21 MED ORDER — EPHEDRINE 5 MG/ML INJ
INTRAVENOUS | Status: AC
  Filled 2017-02-21: qty 10

## 2017-02-21 MED ORDER — SODIUM CHLORIDE 0.9 % IR SOLN
Status: DC | PRN
  Administered 2017-02-21: 3000 mL

## 2017-02-21 MED ORDER — LACTATED RINGERS IV SOLN
INTRAVENOUS | Status: DC | PRN
Start: 2017-02-21 — End: 2017-02-21
  Administered 2017-02-21 (×3): via INTRAVENOUS

## 2017-02-21 MED ORDER — ROCURONIUM BROMIDE 10 MG/ML (PF) SYRINGE
PREFILLED_SYRINGE | INTRAVENOUS | Status: DC | PRN
  Administered 2017-02-21: 50 mg via INTRAVENOUS
  Administered 2017-02-21: 30 mg via INTRAVENOUS

## 2017-02-21 MED ORDER — HEMOSTATIC AGENTS (NO CHARGE) OPTIME
TOPICAL | Status: DC | PRN
Start: 2017-02-21 — End: 2017-02-21
  Administered 2017-02-21: 1 via TOPICAL

## 2017-02-21 MED ORDER — SODIUM CHLORIDE 0.9 % IV SOLN
30.0000 meq | Freq: Once | INTRAVENOUS | Status: DC
  Filled 2017-02-21: qty 15

## 2017-02-21 MED ORDER — CHLORHEXIDINE GLUCONATE 0.12% ORAL RINSE (MEDLINE KIT)
15.0000 mL | Freq: Two times a day (BID) | OROMUCOSAL | Status: DC
  Administered 2017-02-21 – 2017-02-22 (×2): 15 mL via OROMUCOSAL

## 2017-02-21 MED ORDER — SODIUM CHLORIDE 0.9% FLUSH
3.0000 mL | Freq: Two times a day (BID) | INTRAVENOUS | Status: DC
  Administered 2017-02-22 – 2017-02-26 (×7): 3 mL via INTRAVENOUS

## 2017-02-21 MED ORDER — SODIUM CHLORIDE 0.9 % IV SOLN
INTRAVENOUS | Status: DC
  Administered 2017-02-21: 10 mL/h via INTRAVENOUS

## 2017-02-21 MED ORDER — FENTANYL CITRATE (PF) 250 MCG/5ML IJ SOLN
INTRAMUSCULAR | Status: AC
  Filled 2017-02-21: qty 5

## 2017-02-21 MED ORDER — DEXTROSE 5 % IV SOLN
1.5000 g | Freq: Two times a day (BID) | INTRAVENOUS | Status: AC
  Administered 2017-02-21 – 2017-02-23 (×4): 1.5 g via INTRAVENOUS
  Filled 2017-02-21 (×4): qty 1.5

## 2017-02-21 MED ORDER — ARTIFICIAL TEARS OP OINT
TOPICAL_OINTMENT | OPHTHALMIC | Status: DC | PRN
  Administered 2017-02-21: 1 via OPHTHALMIC

## 2017-02-21 MED ORDER — THROMBIN 20000 UNITS EX KIT
PACK | CUTANEOUS | Status: AC
  Filled 2017-02-21: qty 1

## 2017-02-21 MED ORDER — TRAMADOL HCL 50 MG PO TABS: 50.0000 mg | ORAL_TABLET | ORAL | Status: DC | PRN

## 2017-02-21 MED ORDER — ARTIFICIAL TEARS OP OINT
TOPICAL_OINTMENT | OPHTHALMIC | Status: AC
  Filled 2017-02-21: qty 3.5

## 2017-02-21 MED ORDER — NOREPINEPHRINE BITARTRATE 1 MG/ML IV SOLN
0.0000 ug/min | INTRAVENOUS | Status: DC
  Administered 2017-02-21: 5 ug/min via INTRAVENOUS
  Filled 2017-02-21: qty 4

## 2017-02-21 MED ORDER — SODIUM CHLORIDE 0.9 % IV SOLN
INTRAVENOUS | Status: DC
  Administered 2017-02-21: 100 mL/h via INTRAVENOUS

## 2017-02-21 MED ORDER — LACTATED RINGERS IV SOLN: 500.0000 mL | Freq: Once | INTRAVENOUS | Status: DC | PRN

## 2017-02-21 MED ORDER — NITROGLYCERIN IN D5W 200-5 MCG/ML-% IV SOLN: 0.0000 ug/min | INTRAVENOUS | Status: DC

## 2017-02-21 MED ORDER — GELATIN ABSORBABLE MT POWD
OROMUCOSAL | Status: DC | PRN
  Administered 2017-02-21 (×2): via TOPICAL

## 2017-02-21 MED ORDER — ACETAMINOPHEN 160 MG/5ML PO SOLN: 1000.0000 mg | Freq: Four times a day (QID) | ORAL | Status: DC

## 2017-02-21 MED ORDER — THROMBIN 20000 UNITS EX KIT
PACK | CUTANEOUS | Status: DC | PRN
  Administered 2017-02-21: 20000 [IU] via TOPICAL

## 2017-02-21 MED ORDER — ACETAMINOPHEN 650 MG RE SUPP
650.0000 mg | Freq: Once | RECTAL | Status: AC
  Administered 2017-02-21: 650 mg via RECTAL

## 2017-02-21 MED ORDER — VANCOMYCIN HCL IN DEXTROSE 1-5 GM/200ML-% IV SOLN
1000.0000 mg | Freq: Once | INTRAVENOUS | Status: AC
  Administered 2017-02-22: 1000 mg via INTRAVENOUS
  Filled 2017-02-21: qty 200

## 2017-02-21 MED ORDER — METOPROLOL TARTRATE 5 MG/5ML IV SOLN: 2.5000 mg | INTRAVENOUS | Status: DC | PRN

## 2017-02-21 MED ORDER — PHENYLEPHRINE 40 MCG/ML (10ML) SYRINGE FOR IV PUSH (FOR BLOOD PRESSURE SUPPORT)
PREFILLED_SYRINGE | INTRAVENOUS | Status: AC
  Filled 2017-02-21: qty 10

## 2017-02-21 MED ORDER — 0.9 % SODIUM CHLORIDE (POUR BTL) OPTIME
TOPICAL | Status: DC | PRN
  Administered 2017-02-21: 6000 mL

## 2017-02-21 MED ORDER — DEXMEDETOMIDINE HCL IN NACL 200 MCG/50ML IV SOLN
0.0000 ug/kg/h | INTRAVENOUS | Status: DC
  Administered 2017-02-21: 0.5 ug/kg/h via INTRAVENOUS
  Administered 2017-02-21: 0.7 ug/kg/h via INTRAVENOUS
  Filled 2017-02-21 (×2): qty 50

## 2017-02-21 MED ORDER — SODIUM CHLORIDE 0.9 % IV SOLN: 250.0000 mL | INTRAVENOUS | Status: DC

## 2017-02-21 MED ORDER — OXYCODONE HCL 5 MG PO TABS
5.0000 mg | ORAL_TABLET | ORAL | Status: DC | PRN
  Administered 2017-02-22 (×2): 5 mg via ORAL
  Filled 2017-02-21: qty 1
  Filled 2017-02-21: qty 2

## 2017-02-21 MED ORDER — PANTOPRAZOLE SODIUM 40 MG PO TBEC
40.0000 mg | DELAYED_RELEASE_TABLET | Freq: Every day | ORAL | Status: DC
  Administered 2017-02-23 – 2017-02-25 (×3): 40 mg via ORAL
  Filled 2017-02-21 (×5): qty 1

## 2017-02-21 MED ORDER — SODIUM CHLORIDE 0.9 % IV SOLN
0.0000 ug/min | INTRAVENOUS | Status: DC
  Administered 2017-02-21: 20 ug/min via INTRAVENOUS
  Filled 2017-02-21 (×3): qty 2

## 2017-02-21 MED ORDER — ALBUMIN HUMAN 5 % IV SOLN
INTRAVENOUS | Status: DC | PRN
  Administered 2017-02-21: 16:00:00 via INTRAVENOUS

## 2017-02-21 MED ORDER — ACETAMINOPHEN 500 MG PO TABS
1000.0000 mg | ORAL_TABLET | Freq: Four times a day (QID) | ORAL | Status: AC
  Administered 2017-02-22 – 2017-02-26 (×7): 1000 mg via ORAL
  Filled 2017-02-21 (×11): qty 2

## 2017-02-21 MED ORDER — LACTATED RINGERS IV SOLN: INTRAVENOUS | Status: DC

## 2017-02-21 MED ORDER — INSULIN REGULAR BOLUS VIA INFUSION
0.0000 [IU] | Freq: Three times a day (TID) | INTRAVENOUS | Status: DC
Start: 2017-02-22 — End: 2017-02-22
  Filled 2017-02-21: qty 10

## 2017-02-21 MED ORDER — HEPARIN SODIUM (PORCINE) 1000 UNIT/ML IJ SOLN
INTRAMUSCULAR | Status: DC | PRN
  Administered 2017-02-21: 17000 [IU] via INTRAVENOUS

## 2017-02-21 SURGICAL SUPPLY — 176 items
ADAPTER CARDIO PERF ANTE/RETRO (ADAPTER) ×3 IMPLANT
ADAPTER MULTI PERFUSION 15 (ADAPTER) ×1 IMPLANT
ADH SKN CLS APL DERMABOND .7 (GAUZE/BANDAGES/DRESSINGS) ×4
ADH SRG 12 PREFL SYR 3 SPRDR (MISCELLANEOUS)
ADPR PRFSN 84XANTGRD RTRGD (ADAPTER) ×2
APPLICATOR TIP BIOGLUE STANDRD (MISCELLANEOUS) IMPLANT
APPLIER CLIP 9.375 SM OPEN (CLIP) ×3
APR CLP SM 9.3 20 MLT OPN (CLIP) ×2
ATTRACTOMAT 16X20 MAGNETIC DRP (DRAPES) ×2 IMPLANT
BAG DECANTER FOR FLEXI CONT (MISCELLANEOUS) ×3 IMPLANT
BANDAGE ACE 4X5 VEL STRL LF (GAUZE/BANDAGES/DRESSINGS) ×3 IMPLANT
BANDAGE ACE 6X5 VEL STRL LF (GAUZE/BANDAGES/DRESSINGS) ×3 IMPLANT
BASKET HEART (ORDER IN 25'S) (MISCELLANEOUS) ×1
BASKET HEART (ORDER IN 25S) (MISCELLANEOUS) ×2 IMPLANT
BLADE CORE FAN STRYKER (BLADE) ×6 IMPLANT
BLADE MINI RND TIP GREEN BEAV (BLADE) ×1 IMPLANT
BLADE NDL 3 SS STRL (BLADE) IMPLANT
BLADE NEEDLE 3 SS STRL (BLADE) ×3 IMPLANT
BLADE OSCILLATING /SAGITTAL (BLADE) ×3 IMPLANT
BLADE SAW SAG 29X58X.64 (BLADE) IMPLANT
BLADE STERNUM SYSTEM 6 (BLADE) ×3 IMPLANT
BLADE SURG 11 STRL SS (BLADE) ×6 IMPLANT
BLADE SURG 15 STRL LF DISP TIS (BLADE) ×2 IMPLANT
BLADE SURG 15 STRL SS (BLADE) ×3
BNDG GAUZE ELAST 4 BULKY (GAUZE/BANDAGES/DRESSINGS) ×3 IMPLANT
BOOT SUTURE AID YELLOW STND (SUTURE) ×1 IMPLANT
CANISTER SUCT 3000ML PPV (MISCELLANEOUS) ×3 IMPLANT
CANNULA AORTIC ROOT 20012 (MISCELLANEOUS) IMPLANT
CANNULA AORTIC ROOT 9FR (CANNULA) ×2 IMPLANT
CANNULA EZ GLIDE AORTIC 21FR (CANNULA) ×3 IMPLANT
CANNULA FEM VENOUS REMOTE 22FR (CANNULA) ×1 IMPLANT
CANNULA GUNDRY RCSP 15FR (MISCELLANEOUS) ×4 IMPLANT
CANNULA VESSEL 3MM BLUNT TIP (CANNULA) ×2 IMPLANT
CARDIOBLATE CARDIAC ABLATION (MISCELLANEOUS)
CATH CPB KIT OWEN (MISCELLANEOUS) ×3 IMPLANT
CATH HEART VENT LEFT (CATHETERS) ×2 IMPLANT
CATH ROBINSON RED A/P 18FR (CATHETERS) ×6 IMPLANT
CATH THORACIC 36FR (CATHETERS) ×3 IMPLANT
CATH THORACIC 36FR RT ANG (CATHETERS) ×2 IMPLANT
CLIP APPLIE 9.375 SM OPEN (CLIP) IMPLANT
CLIP FOGARTY SPRING 6M (CLIP) ×2 IMPLANT
CLIP TI MEDIUM 24 (CLIP) ×1 IMPLANT
CLIP TI WIDE RED SMALL 24 (CLIP) ×2 IMPLANT
CONN 1/2X1/2X1/2  BEN (MISCELLANEOUS) ×2
CONN 1/2X1/2X1/2 BEN (MISCELLANEOUS) ×2 IMPLANT
CONN 3/8X1/2 ST GISH (MISCELLANEOUS) ×6 IMPLANT
CONN ST 1/4X3/8  BEN (MISCELLANEOUS) ×3
CONN ST 1/4X3/8 BEN (MISCELLANEOUS) IMPLANT
CONN Y 3/8X3/8X3/8  BEN (MISCELLANEOUS)
CONN Y 3/8X3/8X3/8 BEN (MISCELLANEOUS) IMPLANT
CONNECTOR 1/2X3/8X1/2 3 WAY (MISCELLANEOUS) ×1
CONNECTOR 1/2X3/8X1/2 3WAY (MISCELLANEOUS) IMPLANT
CONT SPEC 4OZ CLIKSEAL STRL BL (MISCELLANEOUS) ×2 IMPLANT
COUNTER NEEDLE 20 DBL MAG RED (NEEDLE) ×1 IMPLANT
COVER MAYO STAND STRL (DRAPES) ×1 IMPLANT
COVER PROBE W GEL 5X96 (DRAPES) ×1 IMPLANT
COVER SURGICAL LIGHT HANDLE (MISCELLANEOUS) ×6 IMPLANT
CRADLE DONUT ADULT HEAD (MISCELLANEOUS) ×3 IMPLANT
DERMABOND ADVANCED (GAUZE/BANDAGES/DRESSINGS) ×2
DERMABOND ADVANCED .7 DNX12 (GAUZE/BANDAGES/DRESSINGS) IMPLANT
DEVICE CARDIOBLATE CARDIAC ABL (MISCELLANEOUS) IMPLANT
DEVICE SUT CK QUICK LOAD INDV (Prosthesis & Implant Heart) ×3 IMPLANT
DEVICE SUT CK QUICK LOAD MINI (Prosthesis & Implant Heart) ×1 IMPLANT
DRAIN CHANNEL 32F RND 10.7 FF (WOUND CARE) ×5 IMPLANT
DRAPE CARDIOVASCULAR INCISE (DRAPES) ×3
DRAPE INCISE IOBAN 66X45 STRL (DRAPES) ×3 IMPLANT
DRAPE SLUSH/WARMER DISC (DRAPES) ×3 IMPLANT
DRAPE SRG 135X102X78XABS (DRAPES) ×2 IMPLANT
DRSG AQUACEL AG ADV 3.5X14 (GAUZE/BANDAGES/DRESSINGS) ×1 IMPLANT
DRSG COVADERM 4X14 (GAUZE/BANDAGES/DRESSINGS) ×3 IMPLANT
ELECT BLADE 4.0 EZ CLEAN MEGAD (MISCELLANEOUS) ×3
ELECT REM PT RETURN 9FT ADLT (ELECTROSURGICAL) ×6
ELECTRODE BLDE 4.0 EZ CLN MEGD (MISCELLANEOUS) IMPLANT
ELECTRODE REM PT RTRN 9FT ADLT (ELECTROSURGICAL) ×4 IMPLANT
FELT TEFLON 1X6 (MISCELLANEOUS) ×6 IMPLANT
GAUZE SPONGE 4X4 12PLY STRL (GAUZE/BANDAGES/DRESSINGS) ×8 IMPLANT
GLOVE BIO SURGEON STRL SZ 6 (GLOVE) ×1 IMPLANT
GLOVE BIO SURGEON STRL SZ 6.5 (GLOVE) ×1 IMPLANT
GLOVE BIO SURGEON STRL SZ7 (GLOVE) IMPLANT
GLOVE BIO SURGEON STRL SZ7.5 (GLOVE) IMPLANT
GLOVE BIOGEL M 6.5 STRL (GLOVE) ×2 IMPLANT
GLOVE BIOGEL M STER SZ 6 (GLOVE) ×3 IMPLANT
GLOVE BIOGEL PI IND STRL 6 (GLOVE) IMPLANT
GLOVE BIOGEL PI IND STRL 6.5 (GLOVE) IMPLANT
GLOVE BIOGEL PI INDICATOR 6 (GLOVE) ×1
GLOVE BIOGEL PI INDICATOR 6.5 (GLOVE) ×5
GLOVE ORTHO TXT STRL SZ7.5 (GLOVE) ×9 IMPLANT
GLOVE SURG SS PI 6.0 STRL IVOR (GLOVE) ×1 IMPLANT
GOWN STRL REUS W/ TWL LRG LVL3 (GOWN DISPOSABLE) ×8 IMPLANT
GOWN STRL REUS W/TWL LRG LVL3 (GOWN DISPOSABLE) ×33
HEMOSTAT POWDER SURGIFOAM 1G (HEMOSTASIS) ×9 IMPLANT
INSERT FOGARTY XLG (MISCELLANEOUS) ×3 IMPLANT
KIT BASIN OR (CUSTOM PROCEDURE TRAY) ×3 IMPLANT
KIT DILATOR VASC 18G NDL (KITS) ×1 IMPLANT
KIT ROOM TURNOVER OR (KITS) ×3 IMPLANT
KIT SUCTION CATH 14FR (SUCTIONS) ×7 IMPLANT
KIT SUT CK MINI COMBO 4X17 (Prosthesis & Implant Heart) ×1 IMPLANT
KIT VASOVIEW HEMOPRO VH 3000 (KITS) ×1 IMPLANT
LEAD PACING MYOCARDI (MISCELLANEOUS) ×3 IMPLANT
LINE VENT (MISCELLANEOUS) ×1 IMPLANT
LOOP VESSEL SUPERMAXI WHITE (MISCELLANEOUS) ×4 IMPLANT
MARKER GRAFT CORONARY BYPASS (MISCELLANEOUS) ×8 IMPLANT
NS IRRIG 1000ML POUR BTL (IV SOLUTION) ×15 IMPLANT
PACK OPEN HEART (CUSTOM PROCEDURE TRAY) ×3 IMPLANT
PACK VENTRIC ASSIST CUSTOM (KITS) ×1 IMPLANT
PAD ARMBOARD 7.5X6 YLW CONV (MISCELLANEOUS) ×6 IMPLANT
PAD DEFIB R2 (MISCELLANEOUS) IMPLANT
PAD ELECT DEFIB RADIOL ZOLL (MISCELLANEOUS) ×6 IMPLANT
PENCIL BUTTON HOLSTER BLD 10FT (ELECTRODE) ×4 IMPLANT
PUNCH AORTIC ROTATE  4.5MM 8IN (MISCELLANEOUS) ×1 IMPLANT
SET CARDIOPLEGIA MPS 5001102 (MISCELLANEOUS) ×1 IMPLANT
SET IRRIG TUBING LAPAROSCOPIC (IRRIGATION / IRRIGATOR) ×3 IMPLANT
SOLUTION ANTI FOG 6CC (MISCELLANEOUS) ×1 IMPLANT
SPONGE LAP 18X18 X RAY DECT (DISPOSABLE) ×3 IMPLANT
SPONGE LAP 4X18 X RAY DECT (DISPOSABLE) ×3 IMPLANT
SUCKER INTRACARDIAC WEIGHTED (SUCKER) ×3 IMPLANT
SUT BONE WAX W31G (SUTURE) ×4 IMPLANT
SUT ETHIBON 2 0 V 52N 30 (SUTURE) ×1 IMPLANT
SUT ETHIBON EXCEL 2-0 V-5 (SUTURE) ×9 IMPLANT
SUT ETHIBOND 2 0 SH (SUTURE) ×15 IMPLANT
SUT ETHIBOND 2 0 SH 36X2 (SUTURE) ×4 IMPLANT
SUT ETHIBOND 2 0 V4 (SUTURE) IMPLANT
SUT ETHIBOND 2 0V4 GREEN (SUTURE) IMPLANT
SUT ETHIBOND 4 0 RB 1 (SUTURE) IMPLANT
SUT ETHIBOND NAB MH 2-0 36IN (SUTURE) ×4 IMPLANT
SUT ETHIBOND V-5 VALVE (SUTURE) ×10 IMPLANT
SUT ETHIBOND X763 2 0 SH 1 (SUTURE) ×13 IMPLANT
SUT MNCRL AB 3-0 PS2 18 (SUTURE) ×6 IMPLANT
SUT MNCRL AB 4-0 PS2 18 (SUTURE) ×3 IMPLANT
SUT PDS AB 1 CTX 36 (SUTURE) ×7 IMPLANT
SUT PROLENE 3 0 SH DA (SUTURE) ×5 IMPLANT
SUT PROLENE 3 0 SH1 36 (SUTURE) ×7 IMPLANT
SUT PROLENE 4 0 RB 1 (SUTURE) ×30
SUT PROLENE 4 0 SH DA (SUTURE) ×2 IMPLANT
SUT PROLENE 4-0 RB1 .5 CRCL 36 (SUTURE) ×4 IMPLANT
SUT PROLENE 5 0 C 1 36 (SUTURE) ×4 IMPLANT
SUT PROLENE 6 0 C 1 30 (SUTURE) ×2 IMPLANT
SUT PROLENE 7.0 RB 3 (SUTURE) ×13 IMPLANT
SUT PROLENE 8 0 BV175 6 (SUTURE) ×2 IMPLANT
SUT PROLENE BLUE 7 0 (SUTURE) ×4 IMPLANT
SUT PROLENE POLY MONO (SUTURE) ×5 IMPLANT
SUT SILK  1 MH (SUTURE) ×10
SUT SILK 1 MH (SUTURE) ×6 IMPLANT
SUT SILK 1 TIES 10X30 (SUTURE) ×1 IMPLANT
SUT SILK 2 0 SH CR/8 (SUTURE) ×3 IMPLANT
SUT SILK 2 0 TIES 10X30 (SUTURE) ×2 IMPLANT
SUT SILK 3 0 SH CR/8 (SUTURE) IMPLANT
SUT SILK 4 0 TIE 10X30 (SUTURE) ×2 IMPLANT
SUT STEEL 6MS V (SUTURE) IMPLANT
SUT STEEL STERNAL CCS#1 18IN (SUTURE) ×1 IMPLANT
SUT STEEL SZ 6 DBL 3X14 BALL (SUTURE) ×2 IMPLANT
SUT TEM PAC WIRE 2 0 SH (SUTURE) ×2 IMPLANT
SUT VIC AB 2-0 CT1 27 (SUTURE) ×6
SUT VIC AB 2-0 CT1 TAPERPNT 27 (SUTURE) IMPLANT
SUT VIC AB 2-0 CTX 27 (SUTURE) ×4 IMPLANT
SUTURE E-PAK OPEN HEART (SUTURE) ×2 IMPLANT
SYR 10ML KIT SKIN ADHESIVE (MISCELLANEOUS) IMPLANT
SYSTEM SAHARA CHEST DRAIN ATS (WOUND CARE) ×4 IMPLANT
TAPE CLOTH SURG 4X10 WHT LF (GAUZE/BANDAGES/DRESSINGS) ×3 IMPLANT
TAPE PAPER 2X10 WHT MICROPORE (GAUZE/BANDAGES/DRESSINGS) ×1 IMPLANT
TOWEL GREEN STERILE (TOWEL DISPOSABLE) ×12 IMPLANT
TOWEL GREEN STERILE FF (TOWEL DISPOSABLE) ×6 IMPLANT
TOWEL OR 17X24 6PK STRL BLUE (TOWEL DISPOSABLE) ×3 IMPLANT
TOWEL OR 17X26 10 PK STRL BLUE (TOWEL DISPOSABLE) ×6 IMPLANT
TRAY FOLEY IC TEMP SENS 14FR (CATHETERS) ×3 IMPLANT
TRAY FOLEY IC TEMP SENS 16FR (CATHETERS) ×3 IMPLANT
TUBE SUCT INTRACARD DLP 20F (MISCELLANEOUS) ×2 IMPLANT
TUBE SUCTION CARDIAC 10FR (CANNULA) ×1 IMPLANT
TUBING INSUFFLATION (TUBING) ×1 IMPLANT
UNDERPAD 30X30 (UNDERPADS AND DIAPERS) ×3 IMPLANT
VALVE AORTIC TOP HAT (Prosthesis & Implant Heart) ×6 IMPLANT
VALVE AORTIC TOP HAT 21 (Prosthesis & Implant Heart) IMPLANT
VALVE AORTIC TOP HAT 23 (Prosthesis & Implant Heart) IMPLANT
VENT LEFT HEART 12002 (CATHETERS) ×3
WATER STERILE IRR 1000ML POUR (IV SOLUTION) ×6 IMPLANT
YANKAUER SUCT BULB TIP NO VENT (SUCTIONS) ×2 IMPLANT

## 2017-02-21 NOTE — Progress Notes (Signed)
  Echocardiogram Echocardiogram Transesophageal has been performed.  [name] M 02/21/2017, 9:38 AM 

## 2017-02-21 NOTE — Op Note (Signed)
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, MD  Assistant: Jari Favre, PA-C and Doree Fudge, PA-C  Anesthesia: Rosezella Florida, MD  Operative Findings:  Prosthetic valve dysfunction due to calcification of bioprosthetic valve leafelts causing severe aortic stenosis and moderate aortic insufficiency  Moderate LV systolic dysfunction, EF 40-45%  Very low origins of left main and right coronary arteries, close to the aortic valve annulus  Severe LV systolic dysfunction with initial attempt to wean from cardiopulmonary bypass c/w likely global myocardial ischemia  Normalization of LV systolic function after myocardial revascularization            BRIEF CLINICAL NOTE AND INDICATIONS FOR SURGERY  Patient is a 61 year old gentleman with history of bicuspid aortic valve disease who underwent aortic valve replacement using a bioprosthetic tissue valve via right mini thoracotomy approach December 2010. The patient's valve was replaced using a 21 mm [facility] Ease bovine pericardial tissue valve.Early postoperatively the patient was noted to have a mild paravalvular leak and somewhat elevated transvalvular gradients across the aortic valve suggestive of mild  patient-prosthesis mismatch.Peak velocity across the aortic valve was measured 3.7 m/sec at the time of his first post-operative echocardiogram performed in January 2011, corresponding to a mean transvalvular gradient of 29 mmHg.At that time left ventricular systolic function appeared normal with ejection fraction estimated 55%. Since that time serial transthoracic echocardiograms have demonstrated a gradual decline in left ventricular systolic function with slowly rising transvalvular gradient and increased paravalvular leak. Approximately 2 years ago he was [facility] to consider percutaneous treatment options for management of the patient's paravalvular leak but redo aortic valve replacement was recommended. The patient was referred back to our office for surgical consultation and I had the opportunity to see him in May 2016. At that time he remained essentially asymptomatic but he admitted that he was starting to appreciate a slight decline in his exercise tolerance. Redo aortic valve replacement was recommended. The patient decided to hold off at that time and has been followed intermittently ever since by Dr. Eden Emms. Recent follow-up echocardiogram revealed further decline in the patient's left ventricular systolic function and increased transvalvular gradient across the aortic valve. The patient returns to our office today for follow-up.  The patient has been seen in consultation and counseled at length regarding the indications, risks and potential benefits of surgery.  All questions have been answered, and the patient provides full informed consent for the operation as described.    DETAILS OF THE OPERATIVE PROCEDURE  Preparation:  The patient is brought to the operating room on the above mentioned date and central monitoring was established by the anesthesia team including placement of Swan-Ganz catheter and radial arterial line. The patient is placed in the  supine position on the operating table.  Intravenous antibiotics are administered. General endotracheal anesthesia is induced uneventfully. A  Foley catheter is placed.  Baseline transesophageal echocardiogram was performed.  Findings were notable for moderate global left ventricular systolic dysfunction. Ejection fraction was estimated 40-45%. There was moderate left ventricular hypertrophy. There was a bioprosthetic tissue valve in the aortic position. There was a small paravalvular leak. There was severe aortic stenosis with restricted mobility of the valve leaflets.  The patient's chest, abdomen, both groins, and both lower extremities are prepared and draped in a sterile manner. A time out procedure is performed.   Surgical Approach:  A median sternotomy incision was performed.   The sternum is divided using a sagittal saw. Sternal entry was uneventful. The pericardium is opened directly over the ascending aorta. The incision in the pericardium is extended in both directions without difficulty. There are adhesions between the epicardial surface of the right atrium and the pericardium as well as along side the anterior surface of the aorta and the right lateral surface of the aorta. There are minimal adhesions around the right ventricular outflow tract and the free wall of the right ventricle. The ascending aorta is dissected away from associated structures.  The ascending aorta is normal caliber.   Extracorporeal Cardiopulmonary Bypass and Myocardial Protection:  The right common femoral vein is cannulated using a Seldinger technique and a guidewire advanced under TEE guidance through the right atrium into the superior vena cava. The patient is heparinized systemically. The right common femoral vein is cannulated using a 22 French long femoral venous cannula. The cannula is advanced under TEE guidance until the tip passes through the right atrium into the superior vena cava. The ascending aorta is  cannulated for cardiopulomoary bypass.  Adequate heparinization is verified.     The entire pre-bypass portion of the operation was notable for stable hemodynamics.  Cardiopulmonary bypass was begun.  Vacuum assist venous drainage is utilized. Dissection is now continued to free up all the structures of the mediastinum. Adhesions are particular dense between the medial surface of the right lung and the right atrium. The right atrial appendage is completely adherent to the proximal ascending aorta. Sharp dissection is utilized to free up all of the structures under direct vision.  A left ventricular vent placed through the right superior pulmonary vein.   A retrograde cardioplegia cannula is placed through the right atrium into the coronary sinus.  The operative field was continuously flooded with carbon dioxide gas.  A cardioplegia cannula is placed in the ascending aorta.  A temperature probe was placed in the interventricular septum.  The patient is cooled to 32C systemic temperature.  The aortic cross clamp is applied and cold blood cardioplegia is delivered initially in an antegrade fashion through the aortic root.  Supplemental cardioplegia is given retrograde through the coronary sinus catheter.  Iced saline slush is applied for topical hypothermia.  The initial cardioplegic arrest is rapid with early diastolic arrest.  Repeat doses of cardioplegia are administered intermittently throughout the entire cross clamp portion of the operation through the aortic root and through the coronary sinus catheter in order to maintain completely flat electrocardiogram and septal myocardial temperature below 15C.  Myocardial protection was felt to be excellent.   Redo Aortic Valve Replacement:  An oblique transverse aortotomy incision was performed through the old aortotomy  Incision.  The aortic valve was inspected and notable for a modest amount of calcification involving all 3 leaflets with more severe  calcification involving the noncoronary leaflet of the bioprosthetic tissue valve. A probe is utilized to identify the small  paravalvular leak located close to the commissure between the right and non-coronary leaflet. The old bioprosthetic tissue valve is removed using a #11 blade knife to carefully excise it from the aortic root. Once the valve is been removed the remaining suture material and sewing cuff is debrided from the aortic annulus.  The aortic root is flushed with copious cold saline solution and carefully examined.  Both the left main and the right coronary arteries are notably extremely close to the aortic annulus. The aortic annulus is sized and initially it appears that a 23 mm Sorin Carbomedics Top Hat supraannular bileaflet mechanical prosthetic valve will fit.  Aortic valve replacement was performed using interrupted horizontal mattress 2-0 Ethibond pledgeted sutures with pledgets in the subannular position.  The 23 mm valve is lowered into the annulus but appears to tight due to close proximity to the left main and the right coronary arteries. The 23 mm valve is removed prior to securing any of the sutures and the valve is subsequent replaced using a 21 mm Sorin Carbomedics Top Hat supraannular bileaflet mechanical prosthetic valve (size 21 mm, catalog [id number], serial [id number]).  The valve was lowered into place and all sutures secured using Cor-knot clips.  There appears to be adequate distance to the left main and right coronary arteries, although they remained very close and the circular valve housing tends to pancake stretch the aortic root dimensions. Rewarming is begun.  The aortotomy was closed using a 2-layer closure of running 4-0 Prolene suture with Teflon felt strips to buttress the suture line.  One final dose of warm retrograde hotshot cardioplegia is administered while air is evacuated through the aortic root. The aortic cross-clamp was removed after a cross-clamp time of 111  minutes.  The heart began to beat spontaneously. The aortotomy is inspected for hemostasis. Epicardial pacing wires are fixed to the right ventricular outflow tract and to the right atrial appendage.  The patient is rewarmed to 37C temperature.  An attempt is made to wean the patient from cardiopulmonary bypass. However, the patient has severe global left ventricular systolic dysfunction with diffuse ST segment elevation consistent with global myocardial ischemia. Initially the patient is rested on cardioplegic bypass and all air evacuated using the left ventricular vent and through the aortic root. Despite these maneuvers global left ventricular dysfunction persists, suggesting that there is likely compromise of the origin of the left main coronary artery and possibly the right coronary arteries well.  A decision is made to proceed with surgical revascularization.   Coronary Artery Bypass Grafting:  The left internal mammary artery is dissected from the chest wall and prepared for bypass grafting. The left internal mammary artery is notably good quality conduit. Simultaneously, saphenous vein is obtained from the patient's left thigh using endoscopic vein harvest technique. Initially a small incision made in the right thigh but the vein appeared somewhat small and was not harvested. The saphenous vein removed from the left thigh is notably good quality conduit. After removal of the saphenous vein, the small surgical incisions in the lower extremity are closed with absorbable suture. Following systemic heparinization, the left internal mammary artery was transected distally noted to have excellent flow.  A cardioplegia cannula is replaced in the ascending aorta. A retrograde cardioplegia cannula is replaced into the coronary sinus. The aortic cross clamp is applied and cold blood cardioplegia administered initially through the aortic root. Supplemental cardioplegia is administered retrograde to the  coronary sinus catheter.  The initial cardioplegic arrest is rapid  with early diastolic arrest.  Repeat doses of cardioplegia are administered intermittently throughout the entire cross clamp portion of the operation through the aortic root, through the coronary sinus catheter, and through subsequently placed vein grafts in order to maintain completely flat electrocardiogram and septal myocardial temperature below 15C.  Myocardial protection was felt to be excellent.  The following distal coronary anastomoses are performed:   The distal right coronary artery was grafted using a reversed saphenous vein graft in an end-to-side fashion.  At the site of distal anastomosis the target vessel was good quality and measured approximately 2.5 mm in diameter.  The obtuse marginal branch of the left circumflex coronary artery was grafted using a reversed saphenous vein graft in an end-to-side fashion.  At the site of distal anastomosis the target vessel was good quality and measured approximately 2.0 mm in diameter.  The distal left anterior coronary artery was grafted with the left internal mammary artery in an end-to-side fashion.  At the site of distal anastomosis the target vessel was good quality and measured approximately 2.0 mm in diameter.  Both proximal vein graft anastomoses were placed directly to the ascending aorta prior to removal of the aortic cross clamp.  The septal myocardial temperature rose rapidly after reperfusion of the left internal mammary artery graft.  One final dose of warm retrograde "hot shot" cardioplegia was administered through the coronary sinus catheter while all air was evacuated through the aortic root.  The aortic cross clamp was removed after a second cross clamp time of 74 minutes such that the grand total cross-clamp time for the entire operation was 185 minutes.   Procedure Completion:  All proximal and distal coronary anastomoses were inspected for hemostasis and  appropriate graft orientation. The patient is rewarmed to 37C temperature. The aortic and left ventricular vents were removed.  The patient is weaned and disconnected from cardiopulmonary bypass.  The patient's rhythm at separation from bypass was AV paced.  The patient was weaned from cardiopulmonary bypass on low dose milrinone and levophed infusions.  Total cardiopulmonary bypass time for the operation was 306 minutes.  Followup transesophageal echocardiogram performed after separation from bypass revealed a well-seated bileaflet mechanical valve in the aortic position that was functioning normally.  There was no perivalvular leak.  Mean gradient across the aortic valve was estimated between 12 and 14 mmHg. Left ventricular function appeared much improved, very similar to the patient's baseline preoperatively. There were otherwise no changes from the preoperative exam.  The aortic cannula was removed uneventfully. Protamine was administered to reverse the anticoagulation.  The femoral venous cannula was removed and manual pressure held on the groin for 30 minutes.  The mediastinum and pleural space were inspected for hemostasis and irrigated with saline solution.  There was severe coagulopathy. Blood work obtained prior to reversal of heparin with protamine revealed the presence of severe thrombocytopenia and low fibrinogen level.  The patient received a total of 2 packs adult platelets, 2 units fresh frozen plasma, and one pack cryoprecipitate due to coagulopathy and thrombocytopenia after separation from cardiopulmonary bypass and reversal of heparin with protamine.  The mediastinum and both pleural spaces were drained using 4 chest tubes placed through separate stab incisions inferiorly.  The soft tissues anterior to the aorta were reapproximated loosely. The sternum is closed with double strength sternal wire. The soft tissues anterior to the sternum were closed in multiple layers and the skin is  closed with a running subcuticular skin closure.  The post-bypass portion  of the operation was notable for stable rhythm and hemodynamics.   Disposition:  The patient tolerated the procedure well and is transported to the surgical intensive care in stable condition. There are no intraoperative complications. All sponge instrument and needle counts are verified correct at completion of the operation.   [name] MD 02/21/2017 4:49 PM

## 2017-02-21 NOTE — Interval H&P Note (Signed)
History and Physical Interval Note:  02/21/2017 7:01 AM  [name]  has presented today for surgery, with the diagnosis of SEVERE AS AI  The various methods of treatment have been discussed with the patient and family. After consideration of risks, benefits and other options for treatment, the patient has consented to  Procedure(s): REDO AORTIC VALVE REPLACEMENT (AVR) (N/A) POSSIBLE AORTIC ROOT ENLARGEMENT (N/A) POSSIBLE ASCENDING AORTIC ROOT REPLACEMENT (N/A) TRANSESOPHAGEAL ECHOCARDIOGRAM (TEE) (N/A) as a surgical intervention .  The patient's history has been reviewed, patient examined, no change in status, stable for surgery.  I have reviewed the patient's chart and labs.  Questions were answered to the patient's satisfaction.     Purcell Nails

## 2017-02-21 NOTE — Anesthesia Procedure Notes (Signed)
Procedure Name: Intubation Date/Time: 02/21/2017 7:57 AM Performed by: Rejeana Brock L Pre-anesthesia Checklist: Patient identified, Emergency Drugs available, Suction available and Patient being monitored Patient Re-evaluated:Patient Re-evaluated prior to inductionOxygen Delivery Method: Circle System Utilized Preoxygenation: Pre-oxygenation with 100% oxygen Intubation Type: IV induction Ventilation: Mask ventilation without difficulty Laryngoscope Size: Mac and 4 Grade View: Grade I Tube type: Oral Tube size: 8.0 mm Number of attempts: 1 Airway Equipment and Method: Stylet and Oral airway Placement Confirmation: ETT inserted through vocal cords under direct vision,  positive ETCO2 and breath sounds checked- equal and bilateral Secured at: 23 cm Tube secured with: Tape Dental Injury: Teeth and Oropharynx as per pre-operative assessment

## 2017-02-21 NOTE — Transfer of Care (Signed)
Immediate Anesthesia Transfer of Care Note  Patient: [name]  Procedure(s) Performed: Procedure(s): REDO AORTIC VALVE REPLACEMENT (AVR) implanted with Carbomedics Supra- Annular (Top Hat) size 21 (N/A) TRANSESOPHAGEAL ECHOCARDIOGRAM (TEE) (N/A) CORONARY ARTERY BYPASS GRAFTING (CABG) x3 with left internal mammary artery and left greater saphenous vein harvested endoscopically (N/A)  Patient Location: ICU  Anesthesia Type:General  Level of Consciousness: Patient remains intubated per anesthesia plan  Airway & Oxygen Therapy: Patient remains intubated per anesthesia plan and Patient placed on Ventilator (see vital sign flow sheet for setting)  Post-op Assessment: Report given to RN and Post -op Vital signs reviewed and stable  Post vital signs: Reviewed and stable  Last Vitals:  Vitals:   02/21/17 0610  BP: (!) 144/80  Pulse: (!) 58  Resp: 18  Temp: 36.8 C    Last Pain:  Vitals:   02/21/17 0610  TempSrc: Oral      Patients Stated Pain Goal: 1 (02/21/17 0610)  Complications: No apparent anesthesia complications

## 2017-02-21 NOTE — Anesthesia Postprocedure Evaluation (Signed)
Anesthesia Post Note  Patient: [name]  Procedure(s) Performed: Procedure(s) (LRB): REDO AORTIC VALVE REPLACEMENT (AVR) implanted with Carbomedics Supra- Annular (Top Hat) size 21 (N/A) TRANSESOPHAGEAL ECHOCARDIOGRAM (TEE) (N/A) CORONARY ARTERY BYPASS GRAFTING (CABG) x3 with left internal mammary artery and left greater saphenous vein harvested endoscopically (N/A)  Patient location during evaluation: SICU Anesthesia Type: General Level of consciousness: sedated Pain management: pain level controlled Vital Signs Assessment: post-procedure vital signs reviewed and stable Respiratory status: patient remains intubated per anesthesia plan Cardiovascular status: stable Anesthetic complications: no       Last Vitals:  Vitals:   02/21/17 1930 02/21/17 1945  BP:    Pulse: 82 82  Resp: 12 12  Temp: 36.6 C 36.6 C    Last Pain:  Vitals:   02/21/17 1930  TempSrc: Core (Comment)                 Maxemiliano Riel,W. EDMOND

## 2017-02-21 NOTE — Progress Notes (Signed)
RT notified of desire to start rapid wean 

## 2017-02-21 NOTE — Brief Op Note (Addendum)
02/21/2017  2:30 PM  PATIENT:  [name]  61 y.o. male  PRE-OPERATIVE DIAGNOSIS:  SEVERE AS AI  POST-OPERATIVE DIAGNOSIS:  SEVERE AS AI  PROCEDURE:  Procedure(s): REDO AORTIC VALVE REPLACEMENT (AVR) implanted with Carbomedics Supra- Annular (Top Hat) size 21 (N/A) POSSIBLE AORTIC ROOT ENLARGEMENT (N/A) POSSIBLE ASCENDING AORTIC ROOT REPLACEMENT (N/A) TRANSESOPHAGEAL ECHOCARDIOGRAM (TEE) (N/A) CORONARY ARTERY BYPASS GRAFTING (CABG) x3 with left internal mammary artery and left greater saphenous vein harvested endoscopically (N/A)  SVG to RCA SVG to Om1 LIMA to LAD  SURGEON:    Purcell Nails, MD  ASSISTANTS:  Jari Favre, PA-C  ANESTHESIA:   Gaynelle Adu, MD  CROSSCLAMP TIME:   185'  CARDIOPULMONARY BYPASS TIME: 306'  FINDINGS:  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

## 2017-02-21 NOTE — Progress Notes (Signed)
RT NOTE:  CPAP/PS initiated 

## 2017-02-21 NOTE — Progress Notes (Signed)
RT NOTE:  Cardiac Rapid Wean initiated. Pt follows commands.  

## 2017-02-21 NOTE — Progress Notes (Signed)
CT surgery p.m. Rounds  Patient status post redo AVR with CABG 3 Hemodynamically stable on low-dose inotropes Sedated on ventilator Chest tube drainage initially high--  now it is satisfactory Patient with postop coagulopathy treated with component therapy Postoperative blood gases satisfactory without acidosis Continue current care

## 2017-02-21 NOTE — Anesthesia Procedure Notes (Signed)
Central Venous Catheter Insertion Performed by: Roderic Palau, anesthesiologist Start/End2/27/2018 7:00 AM, 02/21/2017 7:10 AM Patient location: Pre-op. Preanesthetic checklist: patient identified, IV checked, site marked, risks and benefits discussed, surgical consent, monitors and equipment checked, pre-op evaluation, timeout performed and anesthesia consent Position: Trendelenburg Lidocaine 1% used for infiltration and patient sedated Hand hygiene performed , maximum sterile barriers used  and Seldinger technique used Catheter size: 9 Fr Total catheter length 10. Central line and PA cath was placed.MAC introducer Swan type:thermodilution PA Cath depth:50 Procedure performed using ultrasound guided technique. Ultrasound Notes:anatomy identified, needle tip was noted to be adjacent to the nerve/plexus identified, no ultrasound evidence of intravascular and/or intraneural injection and image(s) printed for medical record Attempts: 1 Following insertion, line sutured and dressing applied. Post procedure assessment: blood return through all ports, free fluid flow and no air  Patient tolerated the procedure well with no immediate complications.

## 2017-02-21 NOTE — Progress Notes (Signed)
Per Dr. Donata Clay, only 1 unit PRBC to be given. All other blood products to be given as ordered.   [name]. Ladona Ridgel RN

## 2017-02-22 ENCOUNTER — Inpatient Hospital Stay (HOSPITAL_COMMUNITY): Payer: BLUE CROSS/BLUE SHIELD

## 2017-02-22 ENCOUNTER — Encounter (HOSPITAL_COMMUNITY): Payer: Self-pay | Admitting: Thoracic Surgery (Cardiothoracic Vascular Surgery)

## 2017-02-22 LAB — CBC
HCT: 28.6 % — ABNORMAL LOW (ref 39.0–52.0)
HCT: 27.6 % — ABNORMAL LOW (ref 39.0–52.0)
HEMOGLOBIN: 9.7 g/dL — AB (ref 13.0–17.0)
Hemoglobin: 10.1 g/dL — ABNORMAL LOW (ref 13.0–17.0)
MCH: 30 pg (ref 26.0–34.0)
MCH: 30.3 pg (ref 26.0–34.0)
MCHC: 35.3 g/dL (ref 30.0–36.0)
MCHC: 35.1 g/dL (ref 30.0–36.0)
MCV: 84.9 fL (ref 78.0–100.0)
MCV: 86.3 fL (ref 78.0–100.0)
Platelets: 118 10*3/uL — ABNORMAL LOW (ref 150–400)
Platelets: 94 10*3/uL — ABNORMAL LOW (ref 150–400)
RBC: 3.37 MIL/uL — ABNORMAL LOW (ref 4.22–5.81)
RBC: 3.2 MIL/uL — AB (ref 4.22–5.81)
RDW: 12.8 % (ref 11.5–15.5)
RDW: 13.3 % (ref 11.5–15.5)
WBC: 11.6 10*3/uL — ABNORMAL HIGH (ref 4.0–10.5)
WBC: 9 10*3/uL (ref 4.0–10.5)

## 2017-02-22 LAB — POCT I-STAT 3, ART BLOOD GAS (G3+)
Acid-base deficit: 2 mmol/L (ref 0.0–2.0)
Acid-base deficit: 2 mmol/L (ref 0.0–2.0)
BICARBONATE: 23.2 mmol/L (ref 20.0–28.0)
Bicarbonate: 23.2 mmol/L (ref 20.0–28.0)
O2 SAT: 96 %
O2 Saturation: 94 %
PCO2 ART: 42.8 mmHg (ref 32.0–48.0)
PH ART: 7.348 — AB (ref 7.350–7.450)
PO2 ART: 89 mmHg (ref 83.0–108.0)
Patient temperature: 36.6
Patient temperature: 36.8
TCO2: 24 mmol/L (ref 0–100)
TCO2: 25 mmol/L (ref 0–100)
pCO2 arterial: 42.1 mmHg (ref 32.0–48.0)
pH, Arterial: 7.341 — ABNORMAL LOW (ref 7.350–7.450)
pO2, Arterial: 74 mmHg — ABNORMAL LOW (ref 83.0–108.0)

## 2017-02-22 LAB — PREPARE CRYOPRECIPITATE
UNIT DIVISION: 0
Unit division: 0

## 2017-02-22 LAB — GLUCOSE, CAPILLARY
GLUCOSE-CAPILLARY: 105 mg/dL — AB (ref 65–99)
GLUCOSE-CAPILLARY: 111 mg/dL — AB (ref 65–99)
GLUCOSE-CAPILLARY: 105 mg/dL — AB (ref 65–99)
Glucose-Capillary: 110 mg/dL — ABNORMAL HIGH (ref 65–99)
Glucose-Capillary: 111 mg/dL — ABNORMAL HIGH (ref 65–99)
Glucose-Capillary: 114 mg/dL — ABNORMAL HIGH (ref 65–99)
Glucose-Capillary: 115 mg/dL — ABNORMAL HIGH (ref 65–99)
Glucose-Capillary: 115 mg/dL — ABNORMAL HIGH (ref 65–99)
Glucose-Capillary: 120 mg/dL — ABNORMAL HIGH (ref 65–99)
Glucose-Capillary: 121 mg/dL — ABNORMAL HIGH (ref 65–99)
Glucose-Capillary: 121 mg/dL — ABNORMAL HIGH (ref 65–99)
Glucose-Capillary: 126 mg/dL — ABNORMAL HIGH (ref 65–99)
Glucose-Capillary: 128 mg/dL — ABNORMAL HIGH (ref 65–99)
Glucose-Capillary: 147 mg/dL — ABNORMAL HIGH (ref 65–99)

## 2017-02-22 LAB — BPAM FFP
BLOOD PRODUCT EXPIRATION DATE: 201803022359
BLOOD PRODUCT EXPIRATION DATE: 201803042359
Blood Product Expiration Date: 201803042359
Blood Product Expiration Date: 201803042359
ISSUE DATE / TIME: 201802271432
ISSUE DATE / TIME: 201802271432
ISSUE DATE / TIME: 201802271954
ISSUE DATE / TIME: 201802271954
UNIT TYPE AND RH: 6200
Unit Type and Rh: 6200
Unit Type and Rh: 6200
Unit Type and Rh: 6200

## 2017-02-22 LAB — PREPARE PLATELET PHERESIS
UNIT DIVISION: 0
UNIT DIVISION: 0
Unit division: 0

## 2017-02-22 LAB — BASIC METABOLIC PANEL
Anion gap: 4 — ABNORMAL LOW (ref 5–15)
BUN: 8 mg/dL (ref 6–20)
CO2: 25 mmol/L (ref 22–32)
Calcium: 7.2 mg/dL — ABNORMAL LOW (ref 8.9–10.3)
Chloride: 111 mmol/L (ref 101–111)
Creatinine, Ser: 0.7 mg/dL (ref 0.61–1.24)
GFR calc Af Amer: >60 mL/min (ref >60)
GFR calc non Af Amer: >60 mL/min (ref >60)
Glucose, Bld: 109 mg/dL — ABNORMAL HIGH (ref 65–99)
Potassium: 4.2 mmol/L (ref 3.5–5.1)
Sodium: 140 mmol/L (ref 135–145)

## 2017-02-22 LAB — PREPARE FRESH FROZEN PLASMA
UNIT DIVISION: 0
UNIT DIVISION: 0
Unit division: 0
Unit division: 0

## 2017-02-22 LAB — BPAM CRYOPRECIPITATE
BLOOD PRODUCT EXPIRATION DATE: 201802272138
Blood Product Expiration Date: 201802272138
ISSUE DATE / TIME: 201802271554
ISSUE DATE / TIME: 201802271554
UNIT TYPE AND RH: 5100
Unit Type and Rh: 5100

## 2017-02-22 LAB — POCT I-STAT, CHEM 8
BUN: 11 mg/dL (ref 6–20)
CHLORIDE: 106 mmol/L (ref 101–111)
Calcium, Ion: 1.16 mmol/L (ref 1.15–1.40)
Creatinine, Ser: 0.7 mg/dL (ref 0.61–1.24)
Glucose, Bld: 120 mg/dL — ABNORMAL HIGH (ref 65–99)
HEMATOCRIT: 25 % — AB (ref 39.0–52.0)
Hemoglobin: 8.5 g/dL — ABNORMAL LOW (ref 13.0–17.0)
POTASSIUM: 4.3 mmol/L (ref 3.5–5.1)
SODIUM: 138 mmol/L (ref 135–145)
TCO2: 26 mmol/L (ref 0–100)

## 2017-02-22 LAB — BPAM PLATELET PHERESIS
BLOOD PRODUCT EXPIRATION DATE: 201803012359
Blood Product Expiration Date: 201802282359
Blood Product Expiration Date: 201803012359
ISSUE DATE / TIME: 201802271426
ISSUE DATE / TIME: 201802271426
ISSUE DATE / TIME: 201802271851
UNIT TYPE AND RH: 6200
Unit Type and Rh: 6200
Unit Type and Rh: 600

## 2017-02-22 LAB — CREATININE, SERUM
CREATININE: 0.76 mg/dL (ref 0.61–1.24)
GFR calc Af Amer: >60 mL/min (ref >60)
GFR calc non Af Amer: >60 mL/min (ref >60)

## 2017-02-22 LAB — MAGNESIUM
Magnesium: 2.4 mg/dL (ref 1.7–2.4)
Magnesium: 2.3 mg/dL (ref 1.7–2.4)

## 2017-02-22 MED ORDER — WARFARIN SODIUM 2.5 MG PO TABS
2.5000 mg | ORAL_TABLET | Freq: Every day | ORAL | Status: DC
  Administered 2017-02-22 – 2017-02-23 (×2): 2.5 mg via ORAL
  Filled 2017-02-22 (×2): qty 1

## 2017-02-22 MED ORDER — CITALOPRAM HYDROBROMIDE 20 MG PO TABS
10.0000 mg | ORAL_TABLET | Freq: Every day | ORAL | Status: DC
  Administered 2017-02-22 – 2017-02-27 (×5): 10 mg via ORAL
  Filled 2017-02-22 (×6): qty 1

## 2017-02-22 MED ORDER — KETOROLAC TROMETHAMINE 15 MG/ML IJ SOLN
INTRAMUSCULAR | Status: AC
  Administered 2017-02-22: 15 mg
  Filled 2017-02-22: qty 1

## 2017-02-22 MED ORDER — WARFARIN - PHYSICIAN DOSING INPATIENT: Freq: Every day | Status: DC

## 2017-02-22 MED ORDER — INSULIN ASPART 100 UNIT/ML ~~LOC~~ SOLN
0.0000 [IU] | SUBCUTANEOUS | Status: DC
  Administered 2017-02-22 (×2): 2 [IU] via SUBCUTANEOUS

## 2017-02-22 MED ORDER — ORAL CARE MOUTH RINSE
15.0000 mL | Freq: Two times a day (BID) | OROMUCOSAL | Status: DC
  Administered 2017-02-22 – 2017-02-26 (×6): 15 mL via OROMUCOSAL

## 2017-02-22 MED ORDER — INSULIN DETEMIR 100 UNIT/ML ~~LOC~~ SOLN
20.0000 [IU] | Freq: Once | SUBCUTANEOUS | Status: DC
  Filled 2017-02-22: qty 0.2

## 2017-02-22 MED ORDER — KETOROLAC TROMETHAMINE 15 MG/ML IJ SOLN
15.0000 mg | Freq: Four times a day (QID) | INTRAMUSCULAR | Status: AC
  Administered 2017-02-22 – 2017-02-23 (×5): 15 mg via INTRAVENOUS
  Filled 2017-02-22 (×4): qty 1

## 2017-02-22 MED ORDER — FUROSEMIDE 10 MG/ML IJ SOLN
20.0000 mg | Freq: Four times a day (QID) | INTRAMUSCULAR | Status: AC
  Administered 2017-02-22 – 2017-02-23 (×3): 20 mg via INTRAVENOUS
  Filled 2017-02-22 (×3): qty 2

## 2017-02-22 MED ORDER — ALPRAZOLAM 0.25 MG PO TABS
0.2500 mg | ORAL_TABLET | Freq: Three times a day (TID) | ORAL | Status: DC | PRN
  Administered 2017-02-22 (×2): 0.25 mg via ORAL
  Filled 2017-02-22 (×2): qty 1

## 2017-02-22 MED FILL — Sodium Bicarbonate IV Soln 8.4%: INTRAVENOUS | Qty: 50 | Status: AC

## 2017-02-22 MED FILL — Heparin Sodium (Porcine) Inj 1000 Unit/ML: INTRAMUSCULAR | Qty: 30 | Status: AC

## 2017-02-22 MED FILL — Heparin Sodium (Porcine) Inj 1000 Unit/ML: INTRAMUSCULAR | Qty: 40 | Status: AC

## 2017-02-22 MED FILL — Mannitol IV Soln 20%: INTRAVENOUS | Qty: 500 | Status: AC

## 2017-02-22 MED FILL — Sodium Chloride IV Soln 0.9%: INTRAVENOUS | Qty: 4000 | Status: AC

## 2017-02-22 MED FILL — Electrolyte-R (PH 7.4) Solution: INTRAVENOUS | Qty: 6000 | Status: AC

## 2017-02-22 MED FILL — Potassium Chloride Inj 2 mEq/ML: INTRAVENOUS | Qty: 40 | Status: AC

## 2017-02-22 MED FILL — Lidocaine HCl IV Inj 20 MG/ML: INTRAVENOUS | Qty: 10 | Status: AC

## 2017-02-22 MED FILL — Magnesium Sulfate Inj 50%: INTRAMUSCULAR | Qty: 10 | Status: AC

## 2017-02-22 NOTE — Procedures (Signed)
Extubation Procedure Note  Cardiac rapid wean complete w/o complication Pt follows all commands NIF: -28 VC: .85L Cuff leak present No Stridor Clearly speaks name/location Coughs/clears secretions Tolerating 6L [place]  Pt working with IS  Patient Details:   Name: [name] DOB: [birth year] MRN: [id number]   Airway Documentation:  Pt extubated following Cardiac Rapid Wean  Evaluation  O2 sats: stable throughout Complications: No apparent complications Patient did tolerate procedure well. Bilateral Breath Sounds: Clear   Yes  Elmer Picker 02/22/2017, 12:16 AM

## 2017-02-22 NOTE — Plan of Care (Signed)
Problem: Activity: Goal: Risk for activity intolerance will decrease Outcome: Progressing Pt to be up OOB today  Problem: Bowel/Gastric: Goal: Gastrointestinal status for postoperative course will improve Outcome: Progressing Tolerating sips of water  Problem: Cardiac: Goal: Hemodynamic stability will improve Outcome: Progressing Within parameters with minimal gtt's Goal: Ability to maintain an adequate cardiac output will improve Outcome: Progressing Within parameters Goal: Will show no signs and symptoms of excessive bleeding Outcome: Progressing Within parameters  Problem: Respiratory: Goal: Ability to tolerate decreased levels of ventilator support will improve Outcome: Completed/Met Date Met: 02/22/17 Extubated 02/22/2017  Problem: Pain Management: Goal: Pain level will decrease Outcome: Progressing Minimal c/o pain  Problem: Urinary Elimination: Goal: Ability to achieve and maintain adequate renal perfusion and functioning will improve Outcome: Progressing UOP WNL

## 2017-02-22 NOTE — Progress Notes (Addendum)
[address]       [name] [address]             [phone]      1 Day Post-Op Procedure(s) (LRB): REDO AORTIC VALVE REPLACEMENT (AVR) implanted with Carbomedics Supra- Annular (Top Hat) size 21 (N/A) TRANSESOPHAGEAL ECHOCARDIOGRAM (TEE) (N/A) CORONARY ARTERY BYPASS GRAFTING (CABG) x3 with left internal mammary artery and left greater saphenous vein harvested endoscopically (N/A) Subjective: Groggy this morning. Pain well controlled  Objective: Vital signs in last 24 hours: Temp:  [97.3 F (36.3 C)-99 F (37.2 C)] 99 F (37.2 C) (02/28 0700) Pulse Rate:  [61-91] 89 (02/28 0700) Cardiac Rhythm: Atrial paced (02/28 0400) Resp:  [7-37] 27 (02/28 0700) BP: (87-122)/(54-81) 87/58 (02/28 0700) SpO2:  [94 %-100 %] 97 % (02/28 0700) Arterial Line BP: (84-133)/(51-77) 106/51 (02/28 0700) FiO2 (%):  [40 %-100 %] 40 % (02/27 2335) Weight:  [86 kg (189 lb 9.5 oz)] 86 kg (189 lb 9.5 oz) (02/28 0500)  Hemodynamic parameters for last 24 hours: PAP: (19-35)/(9-23) 20/12 CO:  [4 L/min-5 L/min] 5 L/min CI:  [2 L/min/m2-2.5 L/min/m2] 2.5 L/min/m2  Intake/Output from previous day: 02/27 0701 - 02/28 0700 In: 8111 [I.V.:5325.4; Blood:2025.7; NG/GT:60; IV Piggyback:700] Out: 7789 [Urine:4205; Emesis/NG output:50; Blood:2394; Chest Tube:1140] Intake/Output this shift: Total I/O In: -  Out: 125 [Urine:75; Chest Tube:50]  General appearance: cooperative and no distress Heart: NSR 90s a-paced Lungs: diminished in bilateral lower lobes Abdomen: diminished bowel sounds Extremities: extremities normal, atraumatic, no cyanosis or edema Wound: clean and dry dressed with a sterile dressing  Lab Results:  Recent Labs  02/21/17 2250 02/22/17 0354  WBC 7.7 11.6*  HGB 9.8* 10.1*  HCT 28.0* 28.6*  PLT 88* 118*   BMET:  Recent Labs  02/21/17 2241 02/21/17 2250 02/22/17 0354  NA 142  --  140  K 4.0  --  4.2  CL 106  --  111  CO2  --   --  25  GLUCOSE 118*  --   109*  BUN 8  --  8  CREATININE 0.70 0.75 0.70  CALCIUM  --   --  7.2*    PT/INR:  Recent Labs  02/21/17 1704  LABPROT 19.4*  INR 1.62   ABG    Component Value Date/Time   PHART 7.341 (L) 02/22/2017 0106   HCO3 23.2 02/22/2017 0106   TCO2 25 02/22/2017 0106   ACIDBASEDEF 2.0 02/22/2017 0106   O2SAT 96.0 02/22/2017 0106   CBG (last 3)   Recent Labs  02/22/17 0404 02/22/17 0508 02/22/17 0611  GLUCAP 115* 111* 105*    Assessment/Plan: S/P Procedure(s) (LRB): REDO AORTIC VALVE REPLACEMENT (AVR) implanted with Carbomedics Supra- Annular (Top Hat) size 21 (N/A) TRANSESOPHAGEAL ECHOCARDIOGRAM (TEE) (N/A) CORONARY ARTERY BYPASS GRAFTING (CABG) x3 with left internal mammary artery and left greater saphenous vein harvested endoscopically (N/A)  1. CV-atrial paced in the 90s. MAP is 69. On Neo for support. Keep a-line.  2. Pulm- Extubated late last night. Now on 6L [place]. CXR this morning shows diffuse bilateral interstitial prominence of a small left pleural effusion. Chest tubes remain in place due to output. Output is blood tinged serosanguinous.  3. Renal-creatinine stable and electrolytes okay. Making good urine.  4. Expected acute blood loss anemia, improving 5. Endo-blood glucose well controlled on current regimen 6. Continue post-op antibiotics 7. Low-dose coumadin initiated today  Plan: Continue supportive care. Toradol for pain. Weaning Neo as tolerated. Encouraged incentive spirometry. OOB  to chair. Keep chest tubes. Theone Murdoch removed. Keep a-line.   LOS: 1 day    Sharlene Dory 02/22/2017  I have seen and examined the patient and agree with the assessment and plan as outlined.  Doing well POD1.  Maintaining NSR w/ stable hemodynamics on low dose milrinone and neo drips.  Breathing comfortably w/ O2 sats 97-100%  Purcell Nails, MD 02/22/2017 9:06 AM

## 2017-02-22 NOTE — Progress Notes (Signed)
      [address]       [place] [address]             [phone]    POD # 1 redo AVR   Resting comfortably  BP 95/74 (BP Location: Right Arm)   Pulse 88   Temp 98.4 F (36.9 C) (Oral)   Resp 19   Wt 189 lb 9.5 oz (86 kg)   SpO2 98%   BMI 25.71 kg/m    Intake/Output Summary (Last 24 hours) at 02/22/17 1658 Last data filed at 02/22/17 1645  Gross per 24 hour  Intake          3790.26 ml  Output             3355 ml  Net           435.26 ml   Hct=25 K= 4.3  Doing well POD # 1  Steven C. Dorris Fetch, MD [facility] [phone]

## 2017-02-22 NOTE — Care Management Note (Signed)
Case Management Note  Patient Details  Name: [name] MRN: [id number] Date of Birth: 03/10/1956  Subjective/Objective:   CM met with pt and ex-wife in room, pt OOB to chair.  Pt states he lives alone and no one will be available to assist him when he is discharged, has already identified [facility] as his preferred rehab facility.  Will notify CSW.                              Expected Discharge Plan:  [place]  In-House Referral:  Clinical Social Work  Discharge planning Services  CM Consult  Status of Service:  Completed, signed off  Girard Cooter, [place] 02/22/2017, 10:45 AM

## 2017-02-23 ENCOUNTER — Encounter (HOSPITAL_COMMUNITY): Payer: Self-pay | Admitting: Thoracic Surgery (Cardiothoracic Vascular Surgery)

## 2017-02-23 ENCOUNTER — Inpatient Hospital Stay (HOSPITAL_COMMUNITY): Payer: BLUE CROSS/BLUE SHIELD

## 2017-02-23 DIAGNOSIS — D696 Thrombocytopenia, unspecified: Secondary | ICD-10-CM | POA: Diagnosis present

## 2017-02-23 LAB — BASIC METABOLIC PANEL
ANION GAP: 3 — AB (ref 5–15)
BUN: 13 mg/dL (ref 6–20)
CALCIUM: 7.9 mg/dL — AB (ref 8.9–10.3)
CO2: 28 mmol/L (ref 22–32)
Chloride: 107 mmol/L (ref 101–111)
Creatinine, Ser: 0.87 mg/dL (ref 0.61–1.24)
GFR calc Af Amer: >60 mL/min (ref >60)
GFR calc non Af Amer: >60 mL/min (ref >60)
Glucose, Bld: 125 mg/dL — ABNORMAL HIGH (ref 65–99)
Potassium: 4.1 mmol/L (ref 3.5–5.1)
Sodium: 138 mmol/L (ref 135–145)

## 2017-02-23 LAB — GLUCOSE, CAPILLARY
GLUCOSE-CAPILLARY: 115 mg/dL — AB (ref 65–99)
Glucose-Capillary: 116 mg/dL — ABNORMAL HIGH (ref 65–99)
Glucose-Capillary: 126 mg/dL — ABNORMAL HIGH (ref 65–99)

## 2017-02-23 LAB — CBC
HCT: 26.3 % — ABNORMAL LOW (ref 39.0–52.0)
HEMOGLOBIN: 9.1 g/dL — AB (ref 13.0–17.0)
MCH: 30.3 pg (ref 26.0–34.0)
MCHC: 34.6 g/dL (ref 30.0–36.0)
MCV: 87.7 fL (ref 78.0–100.0)
Platelets: 79 10*3/uL — ABNORMAL LOW (ref 150–400)
RBC: 3 MIL/uL — ABNORMAL LOW (ref 4.22–5.81)
RDW: 13.6 % (ref 11.5–15.5)
WBC: 7.7 10*3/uL (ref 4.0–10.5)

## 2017-02-23 LAB — PROTIME-INR
INR: 1.39
PROTHROMBIN TIME: 17.2 s — AB (ref 11.4–15.2)

## 2017-02-23 MED ORDER — MOVING RIGHT ALONG BOOK
Freq: Once | Status: AC
  Administered 2017-02-23: 08:00:00
  Filled 2017-02-23: qty 1

## 2017-02-23 MED ORDER — FUROSEMIDE 40 MG PO TABS
40.0000 mg | ORAL_TABLET | Freq: Two times a day (BID) | ORAL | Status: DC
  Administered 2017-02-23 – 2017-02-26 (×8): 40 mg via ORAL
  Filled 2017-02-23 (×8): qty 1

## 2017-02-23 MED ORDER — POTASSIUM CHLORIDE CRYS ER 20 MEQ PO TBCR: 20.0000 meq | EXTENDED_RELEASE_TABLET | Freq: Every day | ORAL | Status: DC

## 2017-02-23 MED ORDER — ASPIRIN EC 81 MG PO TBEC
81.0000 mg | DELAYED_RELEASE_TABLET | Freq: Every day | ORAL | Status: DC
  Administered 2017-02-23 – 2017-02-27 (×5): 81 mg via ORAL
  Filled 2017-02-23 (×5): qty 1

## 2017-02-23 MED ORDER — SODIUM CHLORIDE 0.9 % IV SOLN: 250.0000 mL | INTRAVENOUS | Status: DC | PRN

## 2017-02-23 MED ORDER — POTASSIUM CHLORIDE CRYS ER 20 MEQ PO TBCR
20.0000 meq | EXTENDED_RELEASE_TABLET | Freq: Two times a day (BID) | ORAL | Status: DC
  Administered 2017-02-24 – 2017-02-27 (×7): 20 meq via ORAL
  Filled 2017-02-23 (×7): qty 1

## 2017-02-23 MED ORDER — SODIUM CHLORIDE 0.9% FLUSH
3.0000 mL | Freq: Two times a day (BID) | INTRAVENOUS | Status: DC
  Administered 2017-02-23 – 2017-02-26 (×4): 3 mL via INTRAVENOUS

## 2017-02-23 MED ORDER — CARVEDILOL 3.125 MG PO TABS
3.1250 mg | ORAL_TABLET | Freq: Two times a day (BID) | ORAL | Status: DC
  Administered 2017-02-23 – 2017-02-27 (×9): 3.125 mg via ORAL
  Filled 2017-02-23 (×9): qty 1

## 2017-02-23 MED ORDER — SODIUM CHLORIDE 0.9% FLUSH: 3.0000 mL | INTRAVENOUS | Status: DC | PRN

## 2017-02-23 MED ORDER — FUROSEMIDE 40 MG PO TABS: 40.0000 mg | ORAL_TABLET | Freq: Every day | ORAL | Status: DC

## 2017-02-23 NOTE — Progress Notes (Signed)
[address]       [name] [address]             [phone]        CARDIOTHORACIC SURGERY PROGRESS NOTE   R2 Days Post-Op Procedure(s) (LRB): REDO AORTIC VALVE REPLACEMENT (AVR) implanted with Carbomedics Supra- Annular (Top Hat) size 21 (N/A) TRANSESOPHAGEAL ECHOCARDIOGRAM (TEE) (N/A) CORONARY ARTERY BYPASS GRAFTING (CABG) x3 with left internal mammary artery and left greater saphenous vein harvested endoscopically (N/A)  Subjective: Feels better.  Mild soreness in chest.  No SOB.  Not much of an appetite yet but no nausea  Objective: Vital signs: BP Readings from Last 1 Encounters:  02/23/17 107/66   Pulse Readings from Last 1 Encounters:  02/23/17 74   Resp Readings from Last 1 Encounters:  02/23/17 (!) 21   Temp Readings from Last 1 Encounters:  02/23/17 98.7 F (37.1 C) (Oral)    Hemodynamics:    Physical Exam:  Rhythm:   sinus  Breath sounds: clear  Heart sounds:  RRR  Incisions:  Dressings dry, intact  Abdomen:  Soft, non-distended, non-tender  Extremities:  Warm, well-perfused  Chest tubes:  low volume thin serosanguinous output, no air leak    Intake/Output from previous day: 02/28 0701 - 03/01 0700 In: 372.9 [I.V.:272.9; IV Piggyback:100] Out: 2085 [Urine:1555; Chest Tube:530] Intake/Output this shift: Total I/O In: 40 [I.V.:40] Out: -   Lab Results:  CBC: Recent Labs  02/22/17 1645 02/23/17 0400  WBC 9.0 7.7  HGB 9.7* 9.1*  HCT 27.6* 26.3*  PLT 94* 79*    BMET:  Recent Labs  02/22/17 0354 02/22/17 1642 02/22/17 1645 02/23/17 0400  NA 140 138  --  138  K 4.2 4.3  --  4.1  CL 111 106  --  107  CO2 25  --   --  28  GLUCOSE 109* 120*  --  125*  BUN 8 11  --  13  CREATININE 0.70 0.70 0.76 0.87  CALCIUM 7.2*  --   --  7.9*     PT/INR:   Recent Labs  02/23/17 0400  LABPROT 17.2*  INR 1.39    CBG (last 3)   Recent Labs  02/22/17 1952 02/23/17 0346 02/23/17 0738  GLUCAP 147* 116* 115*     ABG    Component Value Date/Time   PHART 7.341 (L) 02/22/2017 0106   PCO2ART 42.8 02/22/2017 0106   PO2ART 89.0 02/22/2017 0106   HCO3 23.2 02/22/2017 0106   TCO2 26 02/22/2017 1642   ACIDBASEDEF 2.0 02/22/2017 0106   O2SAT 96.0 02/22/2017 0106    CXR: PORTABLE CHEST 1 VIEW  COMPARISON:  02/22/2017.  FINDINGS: Unchanged cardiomegaly. Swan-Ganz catheter removed. Mediastinal and LEFT chest tubes stable. LEFT lower lobe atelectasis. LEFT pleural effusion. Patchy opacities LEFT mid lung zone could represent early infiltrate, stable. RIGHT lung aeration improved.  IMPRESSION: Stable to improved aeration.  No pneumothorax.   Electronically Signed   By: Elsie Stain M.D.   On: 02/23/2017 07:36   Assessment/Plan: S/P Procedure(s) (LRB): REDO AORTIC VALVE REPLACEMENT (AVR) implanted with Carbomedics Supra- Annular (Top Hat) size 21 (N/A) TRANSESOPHAGEAL ECHOCARDIOGRAM (TEE) (N/A) CORONARY ARTERY BYPASS GRAFTING (CABG) x3 with left internal mammary artery and left greater saphenous vein harvested endoscopically (N/A)  Doing well POD2 Maintaining NSR w/ stable BP off all drips Breathing comfortably w/ O2 sats 95% on 2 L/min via [place] Expected post op acute blood loss anemia, Hgb stable 9.1 Acute on chronic  combined systolic and diastolic CHF with expected post-op volume excess, I/O's 1.7 liters negative yesterday but weight reportedly still 14 lbs > preop Expected post op atelectasis, mild L > R Chronic thrombocytopenia present preop w/ expected post op thrombocytopenia, platelet count 79k this morning   Mobilize  Diuresis  D/C lines, Foley and tubes  Watch platelet count  Continue coumadin  Transfer step down   Purcell Nails, MD 02/23/2017 8:15 AM

## 2017-02-23 NOTE — Progress Notes (Signed)
Pt has walked twice today, just recently walked over from 2S. Will f/u tomorrow. He is now up in room, feeling queasy. To bathroom. Ethelda Chick CES, ACSM 2:38 PM 02/23/2017

## 2017-02-23 NOTE — Discharge Summary (Signed)
Physician Discharge Summary  Patient ID: CATHY FURY MRN: [id number] DOB/AGE: Dec 14, 1956 61 y.o.  Admit date: 02/21/2017 Discharge date: 02/27/2017  Admission Diagnoses: Patient Active Problem List   Diagnosis Date Noted  . Thrombocytopenia (HCC)   . Prosthetic valve dysfunction   . Aortic stenosis 09/23/2013  . Aortic insufficiency   . Inguinal hernia without mention of obstruction or gangrene, unilateral or unspecified, (not specified as recurrent) 10/11/2011  . Combined systolic and diastolic congestive heart failure (HCC) 08/23/2011  . Shortness of breath 01/04/2011  . HOARSENESS 12/31/2009  . PALPITATIONS, OCCASIONAL 12/31/2009  . ROSACEA 08/26/2009    Discharge Diagnoses:  Principal Problem:   S/P redo aortic valve replacement with bileaflet mechanical valve and CABG x3 Active Problems:   Shortness of breath   Combined systolic and diastolic congestive heart failure (HCC)   S/P AVR (aortic valve replacement)   Aortic stenosis   Aortic insufficiency   Prosthetic valve dysfunction   S/P CABG x 3   Thrombocytopenia (HCC)   Discharged Condition: good  HPI:  Patient is a 61 year old gentleman with history of bicuspid aortic valve disease who underwent aortic valve replacement using a bioprosthetic tissue valve via right mini thoracotomy approach December 2010. The patient's valve was replaced using a 21 mm [facility] Ease bovine pericardial tissue valve.Early postoperatively the patient was noted to have a mild paravalvular leak and somewhat elevated transvalvular gradients across the aortic valve suggestive of mild patient-prosthesis mismatch.Peak velocity across the aortic valve was measured 3.7 m/sec at the time of his first post-operative echocardiogram performed in January 2011, corresponding to a mean transvalvular gradient of 29 mmHg.At that time left ventricular systolic function appeared normal with ejection fraction estimated 55%. Since that time serial  transthoracic echocardiograms have demonstrated a gradual decline in left ventricular systolic function with slowly rising transvalvular gradient and increased paravalvular leak. [facility] treatment options for management of the patient's paravalvular leak but redo aortic valve replacement was recommended. The patient was referred back to our office for surgical consultation and I had the opportunity to see him in May 2016. At that time he remained essentially asymptomatic but he admitted that he was starting to appreciate a slight decline in his exercise tolerance. Redo aortic valve replacement was recommended. The patient decided to hold off at that time and has been followed intermittently ever since by Dr. Eden Emms. Recent follow-up echocardiogram revealed further decline in the patient's left ventricular systolic function and increased transvalvular gradient across the aortic valve. The patient returns to our office today for follow-up.  The patient is now separated from his wife who accompanies him for his office visit today. He admits that he has had a further decline in his exercise tolerance and he is no longer exercising his regularly or as vigorously as he used to in the past. He otherwise denies any significant problems with exertional shortness of breath, and he is quite comfortable with ordinary day-to-day activities. He has not had any chest pain or chest tightness either with activity or at rest. He has had occasional slight dizzy spells when he stands up from a seated position. He denies syncope. He denies any history of PND, orthopnea, or lower extremity edema. He complains of some problems related to stress and depression as well as some problems with short-term memory loss of unclear significance. His appetite is stable but he has lost 4 or 5 pounds in weight. He denies  any fevers or  chills.  Patient returns to the office today for follow-up of prosthetic valve dysfunction with severe symptomatic aortic stenosis and aortic insufficiency status post aortic valve replacement using a bioprosthetic tissue valve in 2010. He was last seen in our office on 11/22/2016 at which time the patient decided to proceed with surgery. He wanted to hold off until late February for personal reasons, and surgery has been scheduled for 02/21/2017. He reports no new problems or complaints over the past few months. He states that his exercise tolerance is decreased but it has not changed substantially over the past couple of months. He denies any exertional shortness of breath with low level activity and he is very comfortable with ordinary activities around the house. He admits that he gets short of breath and tired with more strenuous exertion. He denies any resting shortness of breath, PND, orthopnea, or lower extremity edema. He has not had any chest pain or chest tightness. Appetite is stable. He states that he has lost 4 or 5 pounds over the past 6 months, but he feels that this is related to changing his eating habits. He admits that he has had some problems with concentration and short-term memory loss. He feels this may be related to stress. He has been somewhat depressed for a variety of personal reasons. The remainder of his review of systems is unremarkable.   Hospital Course:  On 02/21/2017 Mr. Dubois underwent a CABG x 3, and redo aortic valve replacement (mechanical) with Dr. Cornelius Moras. He tolerated the procedure well and was transferred to the ICU. He was extubated in a timely manner. On POD 1 he remained on vasopressor support. His chest xray was okay and he was tolerating nasal cannula for oxygen support. We kept his chest tubes due to high output. He remained a-paced. POD 2 we continued to wean his oxygen as tolerated. His hemoglobin remained stable. We discontinued his foley catheter and chest  tubes and he was transferred to the stepdown unit. We continued a diuretic regimen for fluid overload. He is ambulating on room air. He is tolerating a diet and has had a bowel movement. Epicardial pacing wires have been removed. He has been on Coumadin since post op day 1 and PT and INR have been monitored daily. He has received 2 doses of Coumadin 10 mg and his INR is now up to 1.68. He will be discharged on 7.5 mg of Coumadin. He has an appointment to have his PT and INR checked on 03/07. He is felt surgically stable for discharge today.  Consults: None  Significant Diagnostic Studies:  CLINICAL DATA:  Status post cardiac surgery. Continued surveillance.  EXAM: PORTABLE CHEST 1 VIEW  COMPARISON:  02/22/2017.  FINDINGS: Unchanged cardiomegaly. Swan-Ganz catheter removed. Mediastinal and LEFT chest tubes stable. LEFT lower lobe atelectasis. LEFT pleural effusion. Patchy opacities LEFT mid lung zone could represent early infiltrate, stable. RIGHT lung aeration improved.  IMPRESSION: Stable to improved aeration.  No pneumothorax.   Electronically Signed   By: Elsie Stain M.D.   On: 02/23/2017 07:36  Treatments: Date of Procedure:                02/21/2017  Preoperative Diagnosis:       ? Prosthetic Valve Dysfunction s/p Aortic Valve Replacement using Bioprosthetic Tissue Valve ? Severe Aortic Stenosis ? Moderate Aortic Insufficiency  Postoperative Diagnosis:    Same  Procedure:        Redo Aortic Valve Replacement  Sorin Carbomedics QUALCOMM (size 21mm, catalog # [id number], serial # [id number])   Coronary Artery Bypass Grafting x 3             Left Internal Mammary Artery to Distal Left Anterior Descending Coronary Artery             Saphenous Vein Graft to Distal Right Coronary Artery             Saphenous Vein Graft to Obtuse Marginal Branch of Left Circumflex Coronary Artery             Endoscopic Vein Harvest from Left  Thigh  Surgeon:        [name], MD  Assistant:       Jari Favre, PA-C and Doree Fudge, PA-C  Anesthesia:    Rosezella Florida, MD  Operative Findings:  Prosthetic valve dysfunction due to calcification of bioprosthetic valve leafelts causing severe aortic stenosis and moderate aortic insufficiency  Moderate LV systolic dysfunction, EF 40-45%  Very low origins of left main and right coronary arteries, close to the aortic valve annulus  Severe LV systolic dysfunction with initial attempt to wean from cardiopulmonary bypass c/w likely global myocardial ischemia  Normalization of LV systolic function after myocardial revascularization  Discharge Exam: Blood pressure (!) 111/55, pulse 81, temperature 98.7 F (37.1 C), temperature source Oral, resp. rate 18, weight 175 lb 12.8 oz (79.7 kg), SpO2 95 %.   Cardiovascular: RRR, sharp valve click Pulmonary: Clear to auscultation bilaterally Abdomen: Soft, non tender, bowel sounds present. Extremities: Trace bilateral lower extremity edema. Wounds: Clean and dry.  No erythema or signs of infection.   Disposition: 01-Home or Self Care  Discharge Instructions    Amb Referral to Cardiac Rehabilitation    Complete by:  As directed    Diagnosis:   CABG Valve Replacement     Valve:  Aortic   CABG X ___:  3     Allergies as of 02/27/2017      Reactions   No Known Allergies       Medication List    TAKE these medications   amoxicillin 500 MG tablet Commonly known as:  AMOXIL Take 2,000 mg by mouth once as needed (takes prior to dental surgeries).   aspirin EC 81 MG tablet Take 1 tablet (81 mg total) by mouth daily.   carvedilol 3.125 MG tablet Commonly known as:  COREG Take 1 tablet (3.125 mg total) by mouth 2 (two) times daily with a meal.   citalopram 10 MG tablet Commonly known as:  CELEXA Take 10 mg by mouth daily.   FISH OIL PO Take 1 capsule by mouth daily. 750 MG On hold for procedure    multivitamin with minerals Tabs tablet Take 1 tablet by mouth daily. On hold for procedure   oxyCODONE 5 MG immediate release tablet Commonly known as:  Oxy IR/ROXICODONE Take 5 mg by mouth every 4-6 hours PRN severe pain   warfarin 7.5 MG tablet Commonly known as:  COUMADIN Take 1 tablet (7.5 mg total) by mouth daily at 6 PM.      Follow-up Information    Purcell Nails, MD Follow up.   Specialty:  Cardiothoracic Surgery Why:  Your appointment is on April 2nd 2018 at 1:00pm. Please arrive at 12:30pm for a chest xray at [facility] which is located on the first floor of our building.  Contact information: [address] [employer] [address] [place] [address] [phone]  Cline Crock, PA-C Follow up on 03/14/2017.   Specialties:  Cardiology, Radiology Why:  Appointment time is at 9:00 am Contact information: [address] [place] [address] [phone]        [facility] Follow up on 03/01/2017.   Specialty:  Cardiology Why:  Appointment time is at 9:30 am. Appointment is to have PT and INR (on Coumadin for mechanical AVR, INR goal is 2.5) Contact information: [address] [place] [address] [phone]        The patient has been discharged on:   1.Beta Blocker:  Yes [ x  ]                              No   [   ]                              If No, reason:  2.Ace Inhibitor/ARB: Yes [   ]                                     No  [ x   ]                                     If No, reason: Labile BP  3.Statin:   Yes [   ]                  No  [ x  ]                  If No, reason:  4.Ecasa:  Yes  [ x  ]                  No   [   ]                  If No, reason:    Signed: Savio Albrecht 02/27/2017, 10:31 AM

## 2017-02-23 NOTE — Plan of Care (Signed)
Problem: Activity: Goal: Risk for activity intolerance will decrease Outcome: Progressing Pt walked unit x1 02/22/2017   Problem: Bowel/Gastric: Goal: Gastrointestinal status for postoperative course will improve Outcome: Progressing Hypoactive BS. No BM  Problem: Cardiac: Goal: Hemodynamic stability will improve Outcome: Progressing Off drips and within parameters Goal: Will show no signs and symptoms of excessive bleeding Outcome: Progressing Minimal chest tube output

## 2017-02-24 LAB — CBC
HCT: 28 % — ABNORMAL LOW (ref 39.0–52.0)
HEMOGLOBIN: 9.4 g/dL — AB (ref 13.0–17.0)
MCH: 29.7 pg (ref 26.0–34.0)
MCHC: 33.6 g/dL (ref 30.0–36.0)
MCV: 88.6 fL (ref 78.0–100.0)
PLATELETS: 108 10*3/uL — AB (ref 150–400)
RBC: 3.16 MIL/uL — AB (ref 4.22–5.81)
RDW: 13.3 % (ref 11.5–15.5)
WBC: 8.3 10*3/uL (ref 4.0–10.5)

## 2017-02-24 LAB — BASIC METABOLIC PANEL
Anion gap: 7 (ref 5–15)
BUN: 17 mg/dL (ref 6–20)
CHLORIDE: 102 mmol/L (ref 101–111)
CO2: 29 mmol/L (ref 22–32)
CREATININE: 0.98 mg/dL (ref 0.61–1.24)
Calcium: 8.5 mg/dL — ABNORMAL LOW (ref 8.9–10.3)
Glucose, Bld: 113 mg/dL — ABNORMAL HIGH (ref 65–99)
POTASSIUM: 3.6 mmol/L (ref 3.5–5.1)
SODIUM: 138 mmol/L (ref 135–145)

## 2017-02-24 LAB — PROTIME-INR
INR: 1.22
PROTHROMBIN TIME: 15.5 s — AB (ref 11.4–15.2)

## 2017-02-24 MED ORDER — WARFARIN VIDEO
Freq: Once | Status: AC
  Administered 2017-02-24: 12:00:00

## 2017-02-24 MED ORDER — COUMADIN BOOK
Freq: Once | Status: AC
  Administered 2017-02-24: 12:00:00
  Filled 2017-02-24: qty 1

## 2017-02-24 MED ORDER — WARFARIN SODIUM 5 MG PO TABS
5.0000 mg | ORAL_TABLET | Freq: Every day | ORAL | Status: DC
Start: 2017-02-24 — End: 2017-02-25
  Administered 2017-02-24: 5 mg via ORAL
  Filled 2017-02-24: qty 1

## 2017-02-24 NOTE — Progress Notes (Signed)
Spoke with Doylene Canning, PA. Notified insurance unlikely to approve SNF for custodial care. Patient walking 700 feet with PT. [facility] consulted for [facility] RN HHA. Will need HH order at DC. Patient and wife (seperated) both aware.

## 2017-02-24 NOTE — Progress Notes (Signed)
Per MD order, pacer wires pulled. Educated pt regarding procedure. Sutures cut and removed. Wires pulled and band aids applied. V/S per protocol. Will continue to monitor. Hiram Comber RN

## 2017-02-24 NOTE — Progress Notes (Signed)
Clinical Social Worker met patient and patients soon to be ex-wife (they have been separated since October). Patient stated [place] and it would be hard for people to come into his home if any needs arise. Patient ex-wife stated that because they have been separated since October she will "not be left to do wifely duties since they have been separated". Patients ex-wife stated she wants patient to be placed in a SNF. CSW explained to family that PT/OT has cleared patient and if patient decides to go, insurance would not pay for stay. Patient was given a list of private duty facilities  By Nurse Case Manager that could come into his home. Ex-wife was upset that patient would have to pay out of pocket. CSW encouraged family to look at resources given to them to decide on patients outpatient care.  Rhea Pink, MSW,  [place]

## 2017-02-24 NOTE — Progress Notes (Addendum)
      [address]       [place] [address]             [phone]      3 Days Post-Op Procedure(s) (LRB): REDO AORTIC VALVE REPLACEMENT (AVR) implanted with Carbomedics Supra- Annular (Top Hat) size 21 (N/A) TRANSESOPHAGEAL ECHOCARDIOGRAM (TEE) (N/A) CORONARY ARTERY BYPASS GRAFTING (CABG) x3 with left internal mammary artery and left greater saphenous vein harvested endoscopically (N/A)   Subjective:  States doing okay.  Continues to have some diarrhea.  Denies N/V   Objective: Vital signs in last 24 hours: Temp:  [98.4 F (36.9 C)-98.9 F (37.2 C)] 98.9 F (37.2 C) (03/02 0629) Pulse Rate:  [67-80] 80 (03/02 0629) Cardiac Rhythm: Normal sinus rhythm (03/01 2045) Resp:  [15-24] 19 (03/02 0629) BP: (105-114)/(61-91) 112/62 (03/02 0629) SpO2:  [92 %-98 %] 94 % (03/02 0629) Weight:  [191 lb 1.6 oz (86.7 kg)] 191 lb 1.6 oz (86.7 kg) (03/02 0629)  Intake/Output from previous day: 03/01 0701 - 03/02 0700 In: 533 [P.O.:360; I.V.:123; IV Piggyback:50] Out: 503 [Urine:350; Chest Tube:153]  General appearance: alert, cooperative and no distress Heart: regular rate and rhythm Lungs: clear to auscultation bilaterally Abdomen: soft, non-tender; bowel sounds normal; no masses,  no organomegaly Extremities: edema 1-2+ Wound: clean and dry  Lab Results:  Recent Labs  02/23/17 0400 02/24/17 0314  WBC 7.7 8.3  HGB 9.1* 9.4*  HCT 26.3* 28.0*  PLT 79* 108*   BMET:  Recent Labs  02/23/17 0400 02/24/17 0314  NA 138 138  K 4.1 3.6  CL 107 102  CO2 28 29  GLUCOSE 125* 113*  BUN 13 17  CREATININE 0.87 0.98  CALCIUM 7.9* 8.5*    PT/INR:  Recent Labs  02/24/17 0314  LABPROT 15.5*  INR 1.22   ABG    Component Value Date/Time   PHART 7.341 (L) 02/22/2017 0106   HCO3 23.2 02/22/2017 0106   TCO2 26 02/22/2017 1642   ACIDBASEDEF 2.0 02/22/2017 0106   O2SAT 96.0 02/22/2017 0106   CBG (last 3)   Recent Labs  02/22/17 2337 02/23/17 0346  02/23/17 0738  GLUCAP 126* 116* 115*    Assessment/Plan: S/P Procedure(s) (LRB): REDO AORTIC VALVE REPLACEMENT (AVR) implanted with Carbomedics Supra- Annular (Top Hat) size 21 (N/A) TRANSESOPHAGEAL ECHOCARDIOGRAM (TEE) (N/A) CORONARY ARTERY BYPASS GRAFTING (CABG) x3 with left internal mammary artery and left greater saphenous vein harvested endoscopically (N/A)  1. CV- NSR, occasional PVCs- continue Coreg, BP labile no antihypertensive agents at this time.... INR 1.22 will increase coumadin to 5 mg daily... Also remove EPW today 2. Pulm- wean oxygen as tolerated, continue IS. 3. Renal- creatinine stable, weight is trending down, continue Lasix, potassium supplementation 4. Thrombocytopenia- Plt count improving up to 108 5. Expected post operative anemia- Hgb stable at 9.4 6. Dispo- patient stable, NSR, continue Coreg, increase coumadin, will remove EPW, continue diuresis, possibly ready fo d/c this weekend if continues to make progress likely Sun  LOS: 3 days    BARRETT, ERIN 02/24/2017  I have seen and examined the patient and agree with the assessment and plan as outlined.  Anticipate possible d/c 2-3 days.  Will need placement in SNF for short term rehab since he doesn't have anyone to help him at home.  He has already looked at [facility].  Purcell Nails, MD 02/24/2017 8:39 AM

## 2017-02-24 NOTE — Progress Notes (Signed)
Requested PT and OT evals from [facility], and CSW consult for SNF assistance. CM spoke with patient in the room, advised patient to attempt to establish supervision at home in the event SNF not approved. Patient provided with [facility] and private duty resources.

## 2017-02-24 NOTE — Discharge Instructions (Addendum)
Information on my medicine - Coumadin   (Warfarin)  This medication education was reviewed with me or my healthcare representative as part of my discharge preparation.  The pharmacist that spoke with me during my hospital stay was:  Wayland Salinas, [facility]  Why was Coumadin prescribed for you? Coumadin was prescribed for you because you have a blood clot or a medical condition that can cause an increased risk of forming blood clots. Blood clots can cause serious health problems by blocking the flow of blood to the heart, lung, or brain. Coumadin can prevent harmful blood clots from forming. As a reminder your indication for Coumadin is:   Blood Clot Prevention After Heart Valve Surgery  What test will check on my response to Coumadin? While on Coumadin (warfarin) you will need to have an INR test regularly to ensure that your dose is keeping you in the desired range. The INR (international normalized ratio) number is calculated from the result of the laboratory test called prothrombin time (PT).  If an INR APPOINTMENT HAS NOT ALREADY BEEN MADE FOR YOU please schedule an appointment to have this lab work done by your health care provider within 7 days. Your INR goal is usually a number between:  2 to 3 or your provider may give you a more narrow range like 2-2.5.  Ask your health care provider during an office visit what your goal INR is.  What  do you need to  know  About  COUMADIN? Take Coumadin (warfarin) exactly as prescribed by your healthcare provider about the same time each day.  DO NOT stop taking without talking to the doctor who prescribed the medication.  Stopping without other blood clot prevention medication to take the place of Coumadin may increase your risk of developing a new clot or stroke.  Get refills before you run out.  What do you do if you miss a dose? If you miss a dose, take it as soon as you remember on the same day then continue your regularly scheduled  regimen the next day.  Do not take two doses of Coumadin at the same time.  Important Safety Information A possible side effect of Coumadin (Warfarin) is an increased risk of bleeding. You should call your healthcare provider right away if you experience any of the following: ? Bleeding from an injury or your nose that does not stop. ? Unusual colored urine (red or dark brown) or unusual colored stools (red or black). ? Unusual bruising for unknown reasons. ? A serious fall or if you hit your head (even if there is no bleeding).  Some foods or medicines interact with Coumadin (warfarin) and might alter your response to warfarin. To help avoid this: ? Eat a balanced diet, maintaining a consistent amount of Vitamin K. ? Notify your provider about major diet changes you plan to make. ? Avoid alcohol or limit your intake to 1 drink for women and 2 drinks for men per day. (1 drink is 5 oz. wine, 12 oz. beer, or 1.5 oz. liquor.)  Make sure that ANY health care provider who prescribes medication for you knows that you are taking Coumadin (warfarin).  Also make sure the healthcare provider who is monitoring your Coumadin knows when you have started a new medication including herbals and non-prescription products.  Coumadin (Warfarin)  Major Drug Interactions  Increased Warfarin Effect Decreased Warfarin Effect  Alcohol (large quantities) Antibiotics (esp. Septra/Bactrim, Flagyl, Cipro) Amiodarone (Cordarone) Aspirin (ASA) Cimetidine (Tagamet) Megestrol (Megace) NSAIDs (  ibuprofen, naproxen, etc.) Piroxicam (Feldene) Propafenone (Rythmol SR) Propranolol (Inderal) Isoniazid (INH) Posaconazole (Noxafil) Barbiturates (Phenobarbital) Carbamazepine (Tegretol) Chlordiazepoxide (Librium) Cholestyramine (Questran) Griseofulvin Oral Contraceptives Rifampin Sucralfate (Carafate) Vitamin K   Coumadin (Warfarin) Major Herbal Interactions  Increased Warfarin Effect Decreased Warfarin Effect    Garlic Ginseng Ginkgo biloba Coenzyme Q10 Green tea St. Johns wort    Coumadin (Warfarin) FOOD Interactions  Eat a consistent number of servings per week of foods HIGH in Vitamin K (1 serving =  cup)  Collards (cooked, or boiled & drained) Kale (cooked, or boiled & drained) Mustard greens (cooked, or boiled & drained) Parsley *serving size only =  cup Spinach (cooked, or boiled & drained) Swiss chard (cooked, or boiled & drained) Turnip greens (cooked, or boiled & drained)  Eat a consistent number of servings per week of foods MEDIUM-HIGH in Vitamin K (1 serving = 1 cup)  Asparagus (cooked, or boiled & drained) Broccoli (cooked, boiled & drained, or raw & chopped) Brussel sprouts (cooked, or boiled & drained) *serving size only =  cup Lettuce, raw (green leaf, endive, romaine) Spinach, raw Turnip greens, raw & chopped   These websites have more information on Coumadin (warfarin):  http://www.king-russell.com/; https://www.hines.net/;   Coronary Artery Bypass Grafting, Care After These instructions give you information on caring for yourself after your procedure. Your doctor may also give you more specific instructions. Call your doctor if you have any problems or questions after your procedure. Follow these instructions at home:  Only take medicine as told by your doctor. Take medicines exactly as told. Do not stop taking medicines or start any new medicines without talking to your doctor first.  Take your pulse as told by your doctor.  Do deep breathing as told by your doctor. Use your breathing device (incentive spirometer), if given, to practice deep breathing several times a day. Support your chest with a pillow or your arms when you take deep breaths or cough.  Keep the area clean, dry, and protected where the surgery cuts (incisions) were made. Remove bandages (dressings) only as told by your doctor. If strips were applied to surgical area, do not take them off.  They fall off on their own.  Check the surgery area daily for puffiness (swelling), redness, or leaking fluid.  If surgery cuts were made in your legs:  Avoid crossing your legs.  Avoid sitting for long periods of time. Change positions every 30 minutes.  Raise your legs when you are sitting. Place them on pillows.  Wear stockings that help keep blood clots from forming in your legs (compression stockings).  Only take sponge baths until your doctor says it is okay to take showers. Pat the surgery area dry. Do not rub the surgery area with a washcloth or towel. Do not bathe, swim, or use a hot tub until your doctor says it is okay.  Eat foods that are high in fiber. These include raw fruits and vegetables, whole grains, beans, and nuts. Choose lean meats. Avoid canned, processed, and fried foods.  Drink enough fluids to keep your pee (urine) clear or pale yellow.  Weigh yourself every day.  Rest and limit activity as told by your doctor. You may be told to:  Stop any activity if you have chest pain, shortness of breath, changes in heartbeat, or dizziness. Get help right away if this happens.  Move around often for short amounts of time or take short walks as told by your doctor. Gradually become more active. You may  need help to strengthen your muscles and build endurance.  Avoid lifting, pushing, or pulling anything heavier than 10 pounds (4.5 kg) for at least 6 weeks after surgery.  Do not drive until your doctor says it is okay.  Ask your doctor when you can go back to work.  Ask your doctor when you can begin sexual activity again.  Follow up with your doctor as told. Contact a doctor if:  You have puffiness, redness, more pain, or fluid draining from the incision site.  You have a fever.  You have puffiness in your ankles or legs.  You have pain in your legs.  You gain 2 or more pounds (0.9 kg) a day.  You feel sick to your stomach (nauseous) or throw up  (vomit).  You have watery poop (diarrhea). Get help right away if:  You have chest pain that goes to your jaw or arms.  You have shortness of breath.  You have a fast or irregular heartbeat.  You notice a "clicking" in your breastbone when you move.  You have numbness or weakness in your arms or legs.  You feel dizzy or light-headed. This information is not intended to replace advice given to you by your health care provider. Make sure you discuss any questions you have with your health care provider. Document Released: 12/17/2013 Document Revised: 05/19/2016 Document Reviewed: 05/21/2013 Elsevier Interactive Patient Education  2017 [employer].

## 2017-02-24 NOTE — Evaluation (Signed)
Physical Therapy Evaluation/ Discharge Patient Details Name: [name] MRN: [id number] DOB: 1956-11-28 Today's Date: 02/24/2017   History of Present Illness  61 yo admitted for redo AVR and CABG x 3. PMHx: AVR, NICM  Clinical Impression  Pt very pleasant and moving well. Pt has been walking independently on unit and was educated for sternal precautions, transfers, safety with gait and functional mobility. Pt able to return demonstration and verbalize precautions and implementation with all mobility. Pt does not have assist at home for home making, cooking and errands with plan for SNF for supervision but does not require further therapy services at this time. Pt aware of and agreeable to no further therapy needs at this time. Will defer to cardiac rehab.   HR 97 Sats 91% on RA    Follow Up Recommendations No PT follow up (SNF per pt request for supervision)    Equipment Recommendations  None recommended by PT    Recommendations for Other Services       Precautions / Restrictions Precautions Precautions: Sternal      Mobility  Bed Mobility Overal bed mobility: Needs Assistance Bed Mobility: Supine to Sit;Sit to Supine     Supine to sit: Supervision Sit to supine: Supervision   General bed mobility comments: cues for sequence, precautions and tips for ease of transition with pt able to return demonstrate  Transfers Overall transfer level: Modified independent               General transfer comment: pt educated for hand placement with pt able to demonstrate after education   Ambulation/Gait Ambulation/Gait assistance: Modified independent (Device/Increase time) Ambulation Distance (Feet): 700 Feet Assistive device: None Gait Pattern/deviations: Step-through pattern;Decreased stride length   Gait velocity interpretation: Below normal speed for age/gender General Gait Details: pt with cues for decreased speed, periodic standing rest breaks, pursed lip breathing and  posture. Initially pt with sats dropping to 88% on RA but with implementation of education pt maintained sats 91% on RA with activity, HR 97  Stairs            Wheelchair Mobility    Modified Rankin (Stroke Patients Only)       Balance Overall balance assessment: No apparent balance deficits (not formally assessed)                                           Pertinent Vitals/Pain Pain Assessment: No/denies pain    Home Living Family/patient expects to be discharged to:: Private residence Living Arrangements: Alone   Type of Home: House Home Access: Elevator     Home Layout: One level Home Equipment: None      Prior Function Level of Independence: Independent               Hand Dominance        Extremity/Trunk Assessment   Upper Extremity Assessment Upper Extremity Assessment: Overall WFL for tasks assessed    Lower Extremity Assessment Lower Extremity Assessment: Overall WFL for tasks assessed    Cervical / Trunk Assessment Cervical / Trunk Assessment: Normal  Communication   Communication: No difficulties  Cognition Arousal/Alertness: Awake/alert Behavior During Therapy: WFL for tasks assessed/performed Overall Cognitive Status: Within Functional Limits for tasks assessed                      General Comments  Exercises     Assessment/Plan    PT Assessment Patent does not need any further PT services  PT Problem List         PT Treatment Interventions      PT Goals (Current goals can be found in the Care Plan section)  Acute Rehab PT Goals PT Goal Formulation: All assessment and education complete, DC therapy    Frequency     Barriers to discharge        Co-evaluation               End of Session   Activity Tolerance: Patient tolerated treatment well Patient left: in bed;with call bell/phone within reach;with nursing/sitter in room Nurse Communication: Mobility status;Precautions PT  Visit Diagnosis: Difficulty in walking, not elsewhere classified (R26.2)         Time: 1610-9604 PT Time Calculation (min) (ACUTE ONLY): 25 min   Charges:   PT Evaluation $PT Eval Moderate Complexity: 1 Procedure PT Treatments $Gait Training: 8-22 mins   PT G Codes:         Hiro Vipond B Liylah Najarro 03-03-2017, 12:50 PM  Delaney Meigs, PT [phone]

## 2017-02-24 NOTE — Progress Notes (Signed)
Pt has walked x2 this am independently. Sounds like approximately 550 ft each walk. Pt has not been wearing O2 while walking but he has been told he needs it while in room. Will try to check O2 after lunch with walk. Ed completed with good reception. He will need pharmacy to discuss coumadin. Will refer to [facility]. Gave Coumadin and [facility] d/c videos to watch. [phone] Ethelda Chick CES, ACSM 10:41 AM 02/24/2017

## 2017-02-24 NOTE — Evaluation (Signed)
Occupational Therapy Evaluation and Discharge Patient Details Name: [name] MRN: [id number] DOB: [birth year] Today's Date: 02/24/2017    History of Present Illness 61 yo admitted for redo AVR and CABG x 3. PMHx: AVR, NICM   Clinical Impression   Pt reports he was independent with ADL PTA. Currently pt overall mod I for ADL and functional mobility and able to maintain sternal precautions throughout. Educated pt on maintaining sternal precautions for functional activities and use of shower seat for energy conservation if needed upon return home. No further acute OT needs identified; signing off at this time. Please re-consult if needs change. Thank you for this referral.    Follow Up Recommendations  No OT follow up;Supervision - Intermittent    Equipment Recommendations  Tub/shower seat    Recommendations for Other Services       Precautions / Restrictions Precautions Precautions: Sternal Restrictions Weight Bearing Restrictions: Yes (sternal precautions)      Mobility Bed Mobility Overal bed mobility: Needs Assistance Bed Mobility: Rolling;Sit to Sidelying Rolling: Supervision   Supine to sit: Supervision Sit to supine: Supervision Sit to sidelying: Supervision General bed mobility comments: Good technique and maintains precautions  Transfers Overall transfer level: Modified independent Equipment used: None             General transfer comment: increased time. Able to maintain precautions without cues    Balance Overall balance assessment: No apparent balance deficits (not formally assessed)                                          ADL Overall ADL's : Modified independent                                       General ADL Comments: Pt able to perfrom functional mobility and ADL at mod I level this session. Educated pt on maintaining sternal precautions during ADL; pt verbalized understanding. Discussed use of shower chair  if needed for energy conservation     Vision         Perception     Praxis      Pertinent Vitals/Pain Pain Assessment: Faces Faces Pain Scale: Hurts a little bit Pain Location: bil elbows Pain Descriptors / Indicators: Sore Pain Intervention(s): Monitored during session;Repositioned     Hand Dominance Right   Extremity/Trunk Assessment Upper Extremity Assessment Upper Extremity Assessment: Overall WFL for tasks assessed   Lower Extremity Assessment Lower Extremity Assessment: Defer to PT evaluation   Cervical / Trunk Assessment Cervical / Trunk Assessment: Normal   Communication Communication Communication: No difficulties   Cognition Arousal/Alertness: Awake/alert Behavior During Therapy: WFL for tasks assessed/performed Overall Cognitive Status: Within Functional Limits for tasks assessed                     General Comments       Exercises       Shoulder Instructions      Home Living Family/patient expects to be discharged to:: Private residence Living Arrangements: Alone Available Help at Discharge: Family;Friend(s);Available PRN/intermittently Type of Home: House Home Access: Elevator     Home Layout: One level     Bathroom Shower/Tub: Producer, television/film/video: Standard     Home Equipment: None  Prior Functioning/Environment Level of Independence: Independent                 OT Problem List:        OT Treatment/Interventions:      OT Goals(Current goals can be found in the care plan section) Acute Rehab OT Goals OT Goal Formulation: All assessment and education complete, DC therapy  OT Frequency:     Barriers to D/C:            Co-evaluation              End of Session Nurse Communication: Mobility status  Activity Tolerance: Patient tolerated treatment well Patient left: in bed;with call bell/phone within reach  OT Visit Diagnosis: Unsteadiness on feet (R26.81)                ADL  either performed or assessed with clinical judgement  Time: 1431-1441 OT Time Calculation (min): 10 min Charges:  OT General Charges $OT Visit: 1 Procedure OT Evaluation $OT Eval Moderate Complexity: 1 Procedure G-Codes:     Laikyn Gewirtz A. Brett Albino, M.S., OTR/L Pager: [phone]  Gaye Alken 02/24/2017, 2:47 PM

## 2017-02-25 LAB — BPAM RBC
BLOOD PRODUCT EXPIRATION DATE: 201803272359
BLOOD PRODUCT EXPIRATION DATE: 201803272359
Blood Product Expiration Date: 201803272359
Blood Product Expiration Date: 201803272359
Blood Product Expiration Date: 201803282359
Blood Product Expiration Date: 201803282359
ISSUE DATE / TIME: 201802270704
ISSUE DATE / TIME: 201802271954
ISSUE DATE / TIME: 201803021054
ISSUE DATE / TIME: 201803021348
Unit Type and Rh: 5100
Unit Type and Rh: 5100
Unit Type and Rh: 5100
Unit Type and Rh: 5100
Unit Type and Rh: 5100
Unit Type and Rh: 5100

## 2017-02-25 LAB — TYPE AND SCREEN
ABO/RH(D): O POS
Antibody Screen: NEGATIVE
UNIT DIVISION: 0
UNIT DIVISION: 0
Unit division: 0
Unit division: 0
Unit division: 0
Unit division: 0

## 2017-02-25 LAB — PROTIME-INR
INR: 1.19
PROTHROMBIN TIME: 15.2 s (ref 11.4–15.2)

## 2017-02-25 MED ORDER — WARFARIN SODIUM 10 MG PO TABS
10.0000 mg | ORAL_TABLET | Freq: Every day | ORAL | Status: DC
  Administered 2017-02-25 – 2017-02-26 (×2): 10 mg via ORAL
  Filled 2017-02-25 (×2): qty 1

## 2017-02-25 NOTE — Progress Notes (Signed)
CARDIAC REHAB PHASE I      Pt stated he has been walking throughout the day today on his own and would prefer to continue doing that.  He was having lunch when I entered his room and said he was going to walk again after lunch.    Ples Specter, MS ACSM RCEP 02/25/2017 11:58 AM

## 2017-02-25 NOTE — Progress Notes (Addendum)
[address]       [place] [address]             [phone]      4 Days Post-Op Procedure(s) (LRB): REDO AORTIC VALVE REPLACEMENT (AVR) implanted with Carbomedics Supra- Annular (Top Hat) size 21 (N/A) TRANSESOPHAGEAL ECHOCARDIOGRAM (TEE) (N/A) CORONARY ARTERY BYPASS GRAFTING (CABG) x3 with left internal mammary artery and left greater saphenous vein harvested endoscopically (N/A) Subjective: Feels pretty well overall  Objective: Vital signs in last 24 hours: Temp:  [98.7 F (37.1 C)-99.7 F (37.6 C)] 98.7 F (37.1 C) (03/03 0519) Pulse Rate:  [79-97] 81 (03/03 0939) Cardiac Rhythm: Normal sinus rhythm (03/03 0700) Resp:  [18-20] 20 (03/03 0519) BP: (103-127)/(56-79) 119/79 (03/03 0939) SpO2:  [91 %-95 %] 93 % (03/03 0519) Weight:  [186 lb 14.4 oz (84.8 kg)] 186 lb 14.4 oz (84.8 kg) (03/03 0519)  Hemodynamic parameters for last 24 hours:    Intake/Output from previous day: 03/02 0701 - 03/03 0700 In: 360 [P.O.:360] Out: -  Intake/Output this shift: No intake/output data recorded.  General appearance: alert, cooperative and no distress Heart: regular rate and rhythm and + mechanical click , slightly harsh systolic murmur Lungs: min dim in bases Abdomen: benign Extremities: + ankle edema Wound: incis healing well  Lab Results:  Recent Labs  02/23/17 0400 02/24/17 0314  WBC 7.7 8.3  HGB 9.1* 9.4*  HCT 26.3* 28.0*  PLT 79* 108*   BMET:  Recent Labs  02/23/17 0400 02/24/17 0314  NA 138 138  K 4.1 3.6  CL 107 102  CO2 28 29  GLUCOSE 125* 113*  BUN 13 17  CREATININE 0.87 0.98  CALCIUM 7.9* 8.5*    PT/INR:  Recent Labs  02/25/17 0340  LABPROT 15.2  INR 1.19   ABG    Component Value Date/Time   PHART 7.341 (L) 02/22/2017 0106   HCO3 23.2 02/22/2017 0106   TCO2 26 02/22/2017 1642   ACIDBASEDEF 2.0 02/22/2017 0106   O2SAT 96.0 02/22/2017 0106   CBG (last 3)   Recent Labs  02/22/17 2337 02/23/17 0346  02/23/17 0738  GLUCAP 126* 116* 115*    Meds Scheduled Meds: . acetaminophen  1,000 mg Oral Q6H  . aspirin EC  81 mg Oral Daily  . bisacodyl  10 mg Oral Daily   Or  . bisacodyl  10 mg Rectal Daily  . carvedilol  3.125 mg Oral BID WC  . citalopram  10 mg Oral Daily  . docusate sodium  200 mg Oral Daily  . furosemide  40 mg Oral BID  . mouth rinse  15 mL Mouth Rinse BID  . pantoprazole  40 mg Oral Daily  . potassium chloride  20 mEq Oral BID  . sodium chloride flush  3 mL Intravenous Q12H  . sodium chloride flush  3 mL Intravenous Q12H  . warfarin  5 mg Oral q1800  . Warfarin - Physician Dosing Inpatient   Does not apply q1800   Continuous Infusions: . sodium chloride     PRN Meds:.sodium chloride, ALPRAZolam, metoprolol, morphine injection, ondansetron (ZOFRAN) IV, oxyCODONE, sodium chloride flush, sodium chloride flush, traMADol  Xrays No results found.  Assessment/Plan: S/P Procedure(s) (LRB): REDO AORTIC VALVE REPLACEMENT (AVR) implanted with Carbomedics Supra- Annular (Top Hat) size 21 (N/A) TRANSESOPHAGEAL ECHOCARDIOGRAM (TEE) (N/A) CORONARY ARTERY BYPASS GRAFTING (CABG) x3 with left internal mammary artery and left greater saphenous vein harvested endoscopically (N/A)  1 INR decreased- increase coumadin dose  to 10 for now- unsure what home dose looks like currently 2 hemodyn stable in sinus with pvc's 3 cont lasix/potassium for volume overload 4 trying to arrange people to stay at home with him but will not be 24/7- HHN arranged 5 potential d/c in am 6 ambulating well   LOS: 4 days    [name],Daniel E 02/25/2017 Transfer from stepdown  to telemetry room Agree with above assessment and plan Kathlee Nations trigt M.D.

## 2017-02-25 NOTE — Progress Notes (Signed)
Pt transferred from 2w23 to 2w18 to accommodate need for stepdown bed. This RN will continue caring for pt.   Berdine Dance BSN, RN

## 2017-02-26 LAB — PROTIME-INR
INR: 1.25
PROTHROMBIN TIME: 15.8 s — AB (ref 11.4–15.2)

## 2017-02-26 NOTE — Progress Notes (Addendum)
[address]       [place] [address]             [phone]      5 Days Post-Op Procedure(s) (LRB): REDO AORTIC VALVE REPLACEMENT (AVR) implanted with Carbomedics Supra- Annular (Top Hat) size 21 (N/A) TRANSESOPHAGEAL ECHOCARDIOGRAM (TEE) (N/A) CORONARY ARTERY BYPASS GRAFTING (CABG) x3 with left internal mammary artery and left greater saphenous vein harvested endoscopically (N/A) Subjective: conts to feel well   Objective: Vital signs in last 24 hours: Temp:  [98.5 F (36.9 C)-99.4 F (37.4 C)] 98.5 F (36.9 C) (03/04 0532) Pulse Rate:  [79-97] 97 (03/04 0532) Cardiac Rhythm: Normal sinus rhythm (03/04 0721) Resp:  [0-20] 0 (03/04 0532) BP: (103-119)/(45-79) 111/45 (03/04 0532) SpO2:  [93 %-97 %] 97 % (03/04 0532) Weight:  [182 lb 4.8 oz (82.7 kg)] 182 lb 4.8 oz (82.7 kg) (03/04 0532)  Hemodynamic parameters for last 24 hours:    Intake/Output from previous day: 03/03 0701 - 03/04 0700 In: 360 [P.O.:360] Out: -  Intake/Output this shift: No intake/output data recorded.  General appearance: alert, cooperative and no distress Heart: regular rate and rhythm and harsh systolic murmur, with + mech click Lungs: clear to auscultation bilaterally Abdomen: benign Extremities: + LE edema Wound: incis healing well  Lab Results:  Recent Labs  02/24/17 0314  WBC 8.3  HGB 9.4*  HCT 28.0*  PLT 108*   BMET:  Recent Labs  02/24/17 0314  NA 138  K 3.6  CL 102  CO2 29  GLUCOSE 113*  BUN 17  CREATININE 0.98  CALCIUM 8.5*    PT/INR:  Recent Labs  02/26/17 0305  LABPROT 15.8*  INR 1.25   ABG    Component Value Date/Time   PHART 7.341 (L) 02/22/2017 0106   HCO3 23.2 02/22/2017 0106   TCO2 26 02/22/2017 1642   ACIDBASEDEF 2.0 02/22/2017 0106   O2SAT 96.0 02/22/2017 0106   CBG (last 3)  No results for input(s): GLUCAP in the last 72 hours.  Meds Scheduled Meds: . acetaminophen  1,000 mg Oral Q6H  . aspirin EC  81 mg Oral Daily   . bisacodyl  10 mg Oral Daily   Or  . bisacodyl  10 mg Rectal Daily  . carvedilol  3.125 mg Oral BID WC  . citalopram  10 mg Oral Daily  . docusate sodium  200 mg Oral Daily  . furosemide  40 mg Oral BID  . mouth rinse  15 mL Mouth Rinse BID  . pantoprazole  40 mg Oral Daily  . potassium chloride  20 mEq Oral BID  . sodium chloride flush  3 mL Intravenous Q12H  . sodium chloride flush  3 mL Intravenous Q12H  . warfarin  10 mg Oral q1800  . Warfarin - Physician Dosing Inpatient   Does not apply q1800   Continuous Infusions: . sodium chloride     PRN Meds:.sodium chloride, ALPRAZolam, metoprolol, morphine injection, ondansetron (ZOFRAN) IV, oxyCODONE, sodium chloride flush, sodium chloride flush, traMADol  Xrays No results found.  Assessment/Plan: S/P Procedure(s) (LRB): REDO AORTIC VALVE REPLACEMENT (AVR) implanted with Carbomedics Supra- Annular (Top Hat) size 21 (N/A) TRANSESOPHAGEAL ECHOCARDIOGRAM (TEE) (N/A) CORONARY ARTERY BYPASS GRAFTING (CABG) x3 with left internal mammary artery and left greater saphenous vein harvested endoscopically (N/A)  1 doing well, hemodyn stable in sinus rhythm 2 INR 1.25- will give 10 mg coumadin today 3 cont BID lasix for now, should be able to reduce soon-  I/O not accurately recorded 4 home with HH at time of discharge , some friends will check on him but not be with him 24/7  LOS: 5 days    GOLD,WAYNE E 02/26/2017  mild ankle edema nsr EPWs out INR still subtherapeutic patient examined and medical record reviewed,agree with above note. Kathlee Nations Trigt III 02/26/2017

## 2017-02-27 ENCOUNTER — Telehealth (HOSPITAL_COMMUNITY): Payer: Self-pay | Admitting: Family Medicine

## 2017-02-27 LAB — PROTIME-INR
INR: 1.68
Prothrombin Time: 20 seconds — ABNORMAL HIGH (ref 11.4–15.2)

## 2017-02-27 MED ORDER — WARFARIN SODIUM 7.5 MG PO TABS: 7.5000 mg | ORAL_TABLET | Freq: Every day | ORAL | Status: DC

## 2017-02-27 MED ORDER — CARVEDILOL 3.125 MG PO TABS: 3.1250 mg | ORAL_TABLET | Freq: Two times a day (BID) | ORAL | 1 refills | Status: DC

## 2017-02-27 MED ORDER — WARFARIN SODIUM 7.5 MG PO TABS: 7.5000 mg | ORAL_TABLET | Freq: Every day | ORAL | 3 refills | Status: DC

## 2017-02-27 MED ORDER — OXYCODONE HCL 5 MG PO TABS: ORAL_TABLET | ORAL | 0 refills | Status: DC

## 2017-02-27 MED ORDER — WARFARIN SODIUM 5 MG PO TABS: 5.0000 mg | ORAL_TABLET | Freq: Every day | ORAL | Status: DC

## 2017-02-27 MED ORDER — FUROSEMIDE 40 MG PO TABS
40.0000 mg | ORAL_TABLET | Freq: Every day | ORAL | Status: DC
  Administered 2017-02-27: 40 mg via ORAL
  Filled 2017-02-27: qty 1

## 2017-02-27 NOTE — Telephone Encounter (Signed)
Verified [employer] benefits with Passport. No Co-pay or Co-Insurance, Deductible and Out of Pocket $6650.00 which patient has met all Reference # [id number].Marland Kitchen KJ

## 2017-02-27 NOTE — Progress Notes (Addendum)
      [address]       [place] [address]             [phone]        6 Days Post-Op Procedure(s) (LRB): REDO AORTIC VALVE REPLACEMENT (AVR) implanted with Carbomedics Supra- Annular (Top Hat) size 21 (N/A) TRANSESOPHAGEAL ECHOCARDIOGRAM (TEE) (N/A) CORONARY ARTERY BYPASS GRAFTING (CABG) x3 with left internal mammary artery and left greater saphenous vein harvested endoscopically (N/A)  Subjective: Patient walking around in his room. He hopes to go home today.  Objective: Vital signs in last 24 hours: Temp:  [98.4 F (36.9 C)-98.7 F (37.1 C)] 98.7 F (37.1 C) (03/05 0540) Pulse Rate:  [72-79] 74 (03/05 0540) Cardiac Rhythm: Normal sinus rhythm (03/04 1900) Resp:  [18-20] 18 (03/05 0540) BP: (105-120)/(42-70) 120/48 (03/05 0540) SpO2:  [93 %-95 %] 95 % (03/05 0540) Weight:  [175 lb 12.8 oz (79.7 kg)] 175 lb 12.8 oz (79.7 kg) (03/05 0540)  Pre op weight 86 kg Current Weight  02/27/17 175 lb 12.8 oz (79.7 kg)      Intake/Output from previous day: 03/04 0701 - 03/05 0700 In: 480 [P.O.:480] Out: -    Physical Exam:  Cardiovascular: RRR, sharp valve click Pulmonary: Clear to auscultation bilaterally Abdomen: Soft, non tender, bowel sounds present. Extremities: Trace bilateral lower extremity edema. Wounds: Clean and dry.  No erythema or signs of infection.  Lab Results: CBC:No results for input(s): WBC, HGB, HCT, PLT in the last 72 hours. BMET: No results for input(s): NA, K, CL, CO2, GLUCOSE, BUN, CREATININE, CALCIUM in the last 72 hours.  PT/INR:  Lab Results  Component Value Date   INR 1.68 02/27/2017   INR 1.25 02/26/2017   INR 1.19 02/25/2017   ABG:  INR: Will add last result for INR, ABG once components are confirmed Will add last 4 CBG results once components are confirmed  Assessment/Plan:  1. CV - SR. On Coreg 3.125 mg bid and Coumadin. INR increased from 1.25 to 1.68 with Coumadin 10 mg. Will give 7.5 mg this evening. 2.   Pulmonary - On room air. Encourage incentive spirometer. 3. Volume Overload - On Lasix 40 mg bid. He is below pre op weight. Will decrease to daily. 4.  Acute blood loss anemia - Last H and H stable at 9.4 and 28. 5. Will discuss disposition with Dr. Cornelius Moras. He has friends who will check on him but not be with him 24/7  ZIMMERMAN,DONIELLE MPA-C 02/27/2017,7:31 AM  I have seen and examined the patient and agree with the assessment and plan as outlined.  Purcell Nails, MD 02/27/2017 9:39 AM

## 2017-02-27 NOTE — Care Management Note (Addendum)
Case Management Note  Patient Details  Name: EBRAHEEM HEMAN MRN: [id number] Date of Birth: 1956-02-20  Subjective/Objective:                    Action/Plan:  See previous case managers and social workers notes. Patient ambulating 700 feet insurance will not cover CIR or SNF . Joycelyn Man PA aware if [facility] ordered will arrange with [facility] . Would need reason/ instructions for [facility].  Expected Discharge Date:                  Expected Discharge Plan:     In-House Referral:     Discharge planning Services  CM Consult  Post Acute Care Choice:    Choice offered to:     DME Arranged:    DME Agency:     HH Arranged:    HH Agency:     Status of Service:  In process, will continue to follow  If discussed at Long Length of Stay Meetings, dates discussed:    Additional Comments:  Kingsley Plan, RN 02/27/2017, 10:18 AM

## 2017-03-01 ENCOUNTER — Telehealth: Payer: Self-pay | Admitting: *Deleted

## 2017-03-01 ENCOUNTER — Ambulatory Visit (INDEPENDENT_AMBULATORY_CARE_PROVIDER_SITE_OTHER): Payer: BLUE CROSS/BLUE SHIELD | Admitting: *Deleted

## 2017-03-01 DIAGNOSIS — Z5181 Encounter for therapeutic drug level monitoring: Secondary | ICD-10-CM | POA: Diagnosis not present

## 2017-03-01 DIAGNOSIS — Z952 Presence of prosthetic heart valve: Secondary | ICD-10-CM | POA: Diagnosis not present

## 2017-03-01 DIAGNOSIS — Z951 Presence of aortocoronary bypass graft: Secondary | ICD-10-CM

## 2017-03-01 DIAGNOSIS — Z954 Presence of other heart-valve replacement: Secondary | ICD-10-CM | POA: Diagnosis not present

## 2017-03-01 LAB — POCT INR: INR: 2.1

## 2017-03-01 NOTE — Telephone Encounter (Signed)
-----   Message from Wendall Stade, MD sent at 03/01/2017 11:43 AM EST ----- He had bypass grafts with valve so should be on baby aspirin   ----- Message ----- From: Jeannine Kitten, RN Sent: 03/01/2017  10:24 AM To: Wendall Stade, MD  Dr Eden Emms' Saw Mr Hege in coumadin clinic today and on his med list has ASA 81mg  daily and Mr Soto said he was told to stop ASA  Pt states he would prefer not to take ASA if he does not need it  Please advise Thank You Lelon Perla RN

## 2017-03-01 NOTE — Telephone Encounter (Signed)
Spoke with pt and instructed him he is to take ASA 81mg  daily and he states understanding

## 2017-03-01 NOTE — Patient Instructions (Signed)

## 2017-03-08 ENCOUNTER — Ambulatory Visit (INDEPENDENT_AMBULATORY_CARE_PROVIDER_SITE_OTHER): Payer: BLUE CROSS/BLUE SHIELD | Admitting: *Deleted

## 2017-03-08 DIAGNOSIS — Z952 Presence of prosthetic heart valve: Secondary | ICD-10-CM | POA: Diagnosis not present

## 2017-03-08 DIAGNOSIS — Z951 Presence of aortocoronary bypass graft: Secondary | ICD-10-CM

## 2017-03-08 DIAGNOSIS — Z5181 Encounter for therapeutic drug level monitoring: Secondary | ICD-10-CM

## 2017-03-08 DIAGNOSIS — Z954 Presence of other heart-valve replacement: Secondary | ICD-10-CM | POA: Diagnosis not present

## 2017-03-08 LAB — POCT INR: INR: 2.1

## 2017-03-09 NOTE — Progress Notes (Signed)
Cardiology Office Note    Date:  03/14/2017   ID:  [name], DOB Nov 13, 1956, MRN [id number]  PCP:  Lupe Carney, MD  Cardiologist:  Dr. Eden Emms  CC: post hospital follow up- s/p CABG and redo AVR  History of Present Illness:  [name] is a 61 y.o. 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   He has a history of bicuspid aortic valve disease s/p aortic valve replacement using a bioprosthetic tissue valve via right mini thoracotomy approach in 11/2009. The patient's valve was replaced using a 21 mm [facility] Ease bovine pericardial tissue valve.Early postoperatively the patient was noted to have a mild paravalvular leak and somewhat elevated transvalvular gradients across the aortic valve suggestive of mild patient-prosthesis mismatch.Peak velocity across the aortic valve was measured 3.7 m/sec at the time of his first post-operative echocardiogram performed in January 2011, corresponding to a mean transvalvular gradient of 29 mmHg.At that time left ventricular systolic function appeared normal with ejection fraction estimated 55%. Since that time serial transthoracic echocardiograms have demonstrated a gradual decline in left ventricular systolic function with slowly rising transvalvular gradient and increased paravalvular leak. Approximately 2 years ago [facility] to [facility] percutaneous treatment options for management of the patient's paravalvular leak and redo aortic valve replacement was recommended. He was seen back by Dr. Cornelius Moras who recommended surgical repair which was put off by patient (for personal reasons) until 02/21/17.   He was admitted 2/27-02/27/17 for redo aortic valve replacement with bileaflet mechanical valve by Dr. Cornelius Moras. During the surgery he had severe LV systolic dysfunction  with diffuse ST elevation with initial attempt to wean from cardiopulmonary bypass c/w likely global myocardial ischemia. He required CABG x3 V (LIMA--> LAD, SVG--> to RCA, SVG--> OM1) which was not initially intended. He otherwise did well post op and was discharged on coumadin.   Today he presents to clinic for follow up. No CP or SOB. No LE edema, orthopnea or PND. No dizziness or syncope. No blood in stool or urine. No palpitations. He has had some tightness in legs when walking in the spots that they harvested vein graft. He has been walking everyday up to 1/2 hour with no difficulties. He is wondering why he is on both ASA and coumadin. Wants to drive.   Past Medical History:  Diagnosis Date  . Aortic insufficiency   . Aortic valve disease    a. Biscuspid AVR with heavy annular calcification s/p tissue AVR (pt preferred). b. Mod-severe perivalvular regurg by cath 08/2013, mod by TEE 08/2013, gradients lower by TEE than by echo.   . Complication of anesthesia    when intubated  damage to vocal after surgery  2010  . Depression   . Frequent urination   . Hearing loss    bilateral, pt wears hearing aids   . Nonischemic cardiomyopathy (HCC)    a. EF normal immediately after AVR, then decreased to 40-45% by echo & 34% by MRI 2011. b. Most recent EF 40-45% by echo/TEE 08/2013.  . Paravalvular leak of prosthetic heart valve   . Prosthetic valve dysfunction   . Reflux   . Rosacea   . S/P aortic valve replacement with bioprosthetic valve 12/08/2009   21mm [facility] Ease bovine pericardial tissue valve placed via right mini thoracotomy approach  .  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 Shortness of breath    with exertion  . Thrombocytopenia (HCC)     Past Surgical History:  Procedure Laterality Date  .  AORTIC VALVE REPLACEMENT  12/08/2009   Procedure: Right miniature anterior thoracotomy for aortic valve replacement (32-mm [facility] Ease pericardial tissue valve) Surgeon: [name], MD assistant: Kerin Perna, MD  . AORTIC VALVE REPLACEMENT N/A 02/21/2017   Procedure: REDO AORTIC VALVE REPLACEMENT (AVR) implanted with Carbomedics Supra- Annular (Top Hat) size 21;  Surgeon: Purcell Nails, MD;  Location: [facility] OR;  Service: Open Heart Surgery;  Laterality: N/A;  . CARDIAC CATHETERIZATION N/A 01/04/2017   Procedure: Right/Left Heart Cath and Coronary Angiography;  Surgeon: Lyn Records, MD;  Location: [facility] INVASIVE CV LAB;  Service: Cardiovascular;  Laterality: N/A;  . CORONARY ARTERY BYPASS GRAFT N/A 02/21/2017   Procedure: CORONARY ARTERY BYPASS GRAFTING (CABG) x3 with left internal mammary artery and left greater saphenous vein harvested endoscopically;  Surgeon: Purcell Nails, MD;  Location: [facility] OR;  Service: Open Heart Surgery;  Laterality: N/A;  . INGUINAL HERNIA REPAIR     Left  . INGUINAL HERNIA REPAIR  1960   right side  . LEFT AND RIGHT HEART CATHETERIZATION WITH CORONARY ANGIOGRAM N/A 09/19/2013   Procedure: LEFT AND RIGHT HEART CATHETERIZATION WITH CORONARY ANGIOGRAM;  Surgeon: Wendall Stade, MD;  Location: [facility] CATH LAB;  Service: Cardiovascular;  Laterality: N/A;  . TEE WITHOUT CARDIOVERSION N/A 09/19/2013   Procedure: TRANSESOPHAGEAL ECHOCARDIOGRAM (TEE);  Surgeon: Wendall Stade, MD;  Location: [facility] ENDOSCOPY;  Service: Cardiovascular;  Laterality: N/A;  . TEE WITHOUT CARDIOVERSION N/A 02/21/2017   Procedure: TRANSESOPHAGEAL ECHOCARDIOGRAM (TEE);  Surgeon: Purcell Nails, MD;  Location[facility] OR;  Service: Open Heart Surgery;  Laterality: N/A;  . TOOTH EXTRACTION Left August 2014   left bottom molar    Current Medications: Outpatient Medications Prior to Visit  Medication Sig Dispense Refill  . amoxicillin (AMOXIL) 500 MG tablet Take 2,000 mg by mouth once as needed (takes  prior to dental surgeries).     Marland Kitchen aspirin EC 81 MG tablet Take 1 tablet (81 mg total) by mouth daily. 90 tablet 3  . carvedilol (COREG) 3.125 MG tablet Take 1 tablet (3.125 mg total) by mouth 2 (two) times daily with a meal. 60 tablet 1  . citalopram (CELEXA) 10 MG tablet Take 10 mg by mouth daily.  12  . warfarin (COUMADIN) 7.5 MG tablet Take 1 tablet (7.5 mg total) by mouth daily at 6 PM. 30 tablet 3  . Multiple Vitamin (MULTIVITAMIN WITH MINERALS) TABS tablet Take 1 tablet by mouth daily. On hold for procedure    . Omega-3 Fatty Acids (FISH OIL PO) Take 1 capsule by mouth daily. 750 MG On hold for procedure    . oxyCODONE (OXY IR/ROXICODONE) 5 MG immediate release tablet Take 5 mg by mouth every 4-6 hours PRN severe pain (Patient not taking: Reported on 03/14/2017) 30 tablet 0   No facility-administered medications prior to visit.      Allergies:   No known allergies   Social History   Social History  . Marital status: Married    Spouse name: N/A  . Number of children: 2  . Years of  education: N/A   Occupational History  . Business Engineer, manufacturing systems     Social History Main Topics  . Smoking status: Never Smoker  . Smokeless tobacco: Never Used  . Alcohol use 0.6 oz/week    1 Cans of beer per week     Comment: occasional  . Drug use: No  . Sexual activity: Not Asked   Other Topics Concern  . None   Social History Narrative   Exercises 4 days per week     Family History:  The patient's family history includes Hypertension in his father and mother.      ROS:   Please see the history of present illness.    ROS All other systems reviewed and are negative.   PHYSICAL EXAM:   VS:  BP 128/80   Pulse 80   Ht 6' (1.829 m)   Wt 180 lb 12 oz (82 kg)   BMI 24.51 kg/m    GEN: Well nourished, well developed, in no acute distress  HEENT: normal  Neck: no JVD, carotid bruits, or masses Cardiac: RRR; ++ murmurs, rubs, or gallops,no edema    Respiratory:  clear to auscultation bilaterally, normal work of breathing GI: soft, nontender, nondistended, + BS MS: no deformity or atrophy  Skin: warm and dry, no rash, post op wounds healing well.  Neuro:  Alert and Oriented x 3, Strength and sensation are intact Psych: euthymic mood, full affect   Wt Readings from Last 3 Encounters:  03/14/17 180 lb 12 oz (82 kg)  02/27/17 175 lb 12.8 oz (79.7 kg)  02/17/17 180 lb 8 oz (81.9 kg)      Studies/Labs Reviewed:   EKG:  EKG is ordered today.  The ekg ordered today demonstrates NSR, HR 80, septal Q waves, lateral TWIs  Recent Labs: 02/17/2017: ALT 28 02/22/2017: Magnesium 2.3 02/24/2017: BUN 17; Creatinine, Ser 0.98; Hemoglobin 9.4; Platelets 108; Potassium 3.6; Sodium 138   Lipid Panel No results found for: CHOL, TRIG, HDL, CHOLHDL, VLDL, LDLCALC, LDLDIRECT  Additional studies/ records that were reviewed today include:  2D ECHO: 10/17/2016 Study Conclusions - Left ventricle: The cavity size was mildly dilated. There was   mild concentric hypertrophy. Systolic function was moderately to   severely reduced. The estimated ejection fraction was in the   range of 30% to 35%. Akinesis of the anteroseptal myocardium and   hypokinesis of the inferoseptum. Doppler parameters are   consistent with a reversible restrictive pattern, indicative of   decreased left ventricular diastolic compliance and/or increased   left atrial pressure (grade 3 diastolic dysfunction). Doppler   parameters are consistent with high ventricular filling pressure. - Aortic valve: A bioprosthesis was present. Transvalvular   gradients are severely elevated, slightly higher than when last   checked 11/2014. There was moderate perivalvular regurgitation.   Peak velocity (S): 443 cm/s. Mean gradient (S): 45 mm Hg. - Mitral valve: Transvalvular velocity was within the normal range.   There was no evidence for stenosis. There was mild regurgitation. - Left atrium:  The atrium was severely dilated. - Right ventricle: The cavity size was normal. Wall thickness was   normal. Systolic function was normal. - Atrial septum: No defect or patent foramen ovale was identified   by color flow Doppler. - Tricuspid valve: There was trivial regurgitation. - Pulmonary arteries: Systolic pressure was at the upper limits of   normal. PA peak pressure: 37 mm Hg (S). Impressions: - Compared with echo 11/2014, mean gradient across the  aortic valve   has increased from 39 mmHg to 45 mmHg.  Treatments: Date of Procedure:02/21/2017  Preoperative Diagnosis: ? Prosthetic Valve Dysfunction s/p Aortic Valve Replacement using Bioprosthetic Tissue Valve ? Severe Aortic Stenosis ? Moderate Aortic Insufficiency  Postoperative Diagnosis:Same  Procedure:   Redo Aortic Valve Replacement Sorin Carbomedics Top Hat Bileaflet MechanicalValve (size 11mm, catalog # [id number], serial # [id number])   Coronary Artery Bypass Grafting x 3 Left Internal Mammary Artery to Distal Left Anterior Descending Coronary Artery Saphenous Vein Graft to Distal Right Coronary Artery Saphenous Vein Graft to Obtuse Marginal Branch of Left Circumflex Coronary Artery Endoscopic Vein Harvest from LeftThigh  Surgeon:Clarence H. Cornelius Moras, MD  Assistant:Tessa Asa Lente, PA-C and Doree Fudge, PA-C  Anesthesia:William Aleene Davidson, MD  Operative Findings:  Prosthetic valve dysfunction due to calcification of bioprosthetic valve leafelts causing severe aortic stenosis and moderate aortic insufficiency  Moderate LV systolic dysfunction, EF 40-45%  Very low origins of left main and right coronary arteries, close to the aortic valve annulus  Severe LV systolic dysfunction with initial attempt to wean from cardiopulmonary bypass c/w likely global myocardial ischemia  Normalization of  LV systolic function after myocardial revascularization    ASSESSMENT & PLAN:   Bicuspid AS s/p AVR (2010) with 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 Continue ASA and coumadin. INR has been well controlled. He sees our coumadin clinic today. Discussed with Dr. Eden Emms who said he needs ASA given bypass grafts. Dr Eden Emms also cleared him to drive. He sees Dr. Cornelius Moras next week.   Chronic systolic CHF: per OP note, EF 31-49% and then severe LV systolic dysfunction with initial attempt to wean from cardiopulmonary bypass c/w likely global myocardial ischemia. There was normalization of LV systolic function after myocardial revascularization. Continue Coreg. No s/s CHF. Hopefully EF will improve with AVR and bypass.   Long term oral anticoagulation: continue follow up with our coumadin clinic    Medication Adjustments/Labs and Tests Ordered: Current medicines are reviewed at length with the patient today.  Concerns regarding medicines are outlined above.  Medication changes, Labs and Tests ordered today are listed in the Patient Instructions below. Patient Instructions  Medication Instructions:  Your physician recommends that you continue on your current medications as directed. Please refer to the Current Medication list given to you today.   Labwork: None ordered  Testing/Procedures: None ordered  Follow-Up: Your physician recommends that you schedule a follow-up appointment in: 3 MONTHS WITH DR. Eden Emms    Any Other Special Instructions Will Be Listed Below (If Applicable).   If you need a refill on your cardiac medications before your next appointment, please call your pharmacy.      Signed, Cline Crock, PA-C  03/14/2017 9:24 AM    [facility] [address] [place], [place], [place]  [address] Phone: [phone]; Fax: [phone]

## 2017-03-14 ENCOUNTER — Ambulatory Visit (INDEPENDENT_AMBULATORY_CARE_PROVIDER_SITE_OTHER): Payer: BLUE CROSS/BLUE SHIELD | Admitting: Physician Assistant

## 2017-03-14 ENCOUNTER — Ambulatory Visit (INDEPENDENT_AMBULATORY_CARE_PROVIDER_SITE_OTHER): Payer: BLUE CROSS/BLUE SHIELD | Admitting: *Deleted

## 2017-03-14 ENCOUNTER — Encounter: Payer: Self-pay | Admitting: Physician Assistant

## 2017-03-14 VITALS — BP 128/80 | HR 80 | Ht 72.0 in | Wt 180.8 lb

## 2017-03-14 DIAGNOSIS — Z951 Presence of aortocoronary bypass graft: Secondary | ICD-10-CM | POA: Diagnosis not present

## 2017-03-14 DIAGNOSIS — Z5181 Encounter for therapeutic drug level monitoring: Secondary | ICD-10-CM | POA: Diagnosis not present

## 2017-03-14 DIAGNOSIS — Z952 Presence of prosthetic heart valve: Secondary | ICD-10-CM | POA: Diagnosis not present

## 2017-03-14 DIAGNOSIS — Z954 Presence of other heart-valve replacement: Secondary | ICD-10-CM

## 2017-03-14 DIAGNOSIS — Z7901 Long term (current) use of anticoagulants: Secondary | ICD-10-CM

## 2017-03-14 LAB — POCT INR: INR: 3.2

## 2017-03-14 NOTE — Patient Instructions (Signed)
Medication Instructions:  Your physician recommends that you continue on your current medications as directed. Please refer to the Current Medication list given to you today.   Labwork: None ordered  Testing/Procedures: None ordered  Follow-Up: Your physician recommends that you schedule a follow-up appointment in: 3 MONTHS WITH DR. NISHAN   Any Other Special Instructions Will Be Listed Below (If Applicable).   If you need a refill on your cardiac medications before your next appointment, please call your pharmacy.   

## 2017-03-16 ENCOUNTER — Other Ambulatory Visit: Payer: Self-pay | Admitting: Thoracic Surgery (Cardiothoracic Vascular Surgery)

## 2017-03-16 DIAGNOSIS — Z951 Presence of aortocoronary bypass graft: Secondary | ICD-10-CM

## 2017-03-20 ENCOUNTER — Encounter: Payer: Self-pay | Admitting: Thoracic Surgery (Cardiothoracic Vascular Surgery)

## 2017-03-20 ENCOUNTER — Ambulatory Visit
Admission: RE | Admit: 2017-03-20 | Discharge: 2017-03-20 | Disposition: A | Discharge status: Home / Self Care | Payer: BLUE CROSS/BLUE SHIELD | Source: Ambulatory Visit | Attending: Thoracic Surgery (Cardiothoracic Vascular Surgery) | Admitting: Thoracic Surgery (Cardiothoracic Vascular Surgery)

## 2017-03-20 ENCOUNTER — Ambulatory Visit (INDEPENDENT_AMBULATORY_CARE_PROVIDER_SITE_OTHER): Payer: Self-pay | Admitting: Thoracic Surgery (Cardiothoracic Vascular Surgery)

## 2017-03-20 VITALS — BP 124/76 | HR 77 | Resp 16 | Ht 72.0 in | Wt 180.0 lb

## 2017-03-20 DIAGNOSIS — Z951 Presence of aortocoronary bypass graft: Secondary | ICD-10-CM

## 2017-03-20 DIAGNOSIS — Z952 Presence of prosthetic heart valve: Secondary | ICD-10-CM

## 2017-03-20 DIAGNOSIS — I2510 Atherosclerotic heart disease of native coronary artery without angina pectoris: Secondary | ICD-10-CM

## 2017-03-20 DIAGNOSIS — J90 Pleural effusion, not elsewhere classified: Secondary | ICD-10-CM | POA: Diagnosis not present

## 2017-03-20 DIAGNOSIS — Z954 Presence of other heart-valve replacement: Secondary | ICD-10-CM

## 2017-03-20 DIAGNOSIS — T8209XD Other mechanical complication of heart valve prosthesis, subsequent encounter: Secondary | ICD-10-CM

## 2017-03-20 DIAGNOSIS — I350 Nonrheumatic aortic (valve) stenosis: Secondary | ICD-10-CM

## 2017-03-20 NOTE — Patient Instructions (Signed)
Continue all previous medications without any changes at this time  Continue to avoid any heavy lifting or strenuous use of your arms or shoulders for at least a total of three months from the time of surgery.  After three months you may gradually increase how much you lift or otherwise use your arms or chest as tolerated, with limits based upon whether or not activities lead to the return of significant discomfort.  You may return to driving an automobile as long as you are no longer requiring oral narcotic pain relievers during the daytime.  It would be wise to start driving only short distances during the daylight and gradually increase from there as you feel comfortable.  You are encouraged to enroll and participate in the outpatient cardiac rehab program beginning as soon as practical.  

## 2017-03-20 NOTE — Progress Notes (Signed)
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  The patient's postoperative recovery was uneventful and he was discharged home on the sixth postoperative day.  Since hospital discharge his prothrombin time and Coumadin dose at been monitored and adjusted through the Coumadin clinic. Most recent INR was 3.2. He was seen for routine follow-up by Cline Crock at [facility] last week and he returns to our office for routine follow-up today.  He reports that he is doing exceptionally well. He has never had much of any pain in his chest and he hasn't been taking any sort of pain relievers. His appetite is good. He is quite active and reports that he is walking at least a mile 5 times a week. When he walks he states that he walks at a fairly good pacing and he has no shortness of breath. Overall he is delighted with his progress.   Current Outpatient Prescriptions  Medication Sig Dispense Refill  . amoxicillin (AMOXIL) 500 MG tablet Take 2,000 mg by mouth once as needed (takes prior to dental surgeries).     Marland Kitchen aspirin EC 81 MG tablet Take 1 tablet (81 mg total) by mouth daily. 90 tablet 3  .  carvedilol (COREG) 3.125 MG tablet Take 1 tablet (3.125 mg total) by mouth 2 (two) times daily with a meal. 60 tablet 1  . citalopram (CELEXA) 10 MG tablet Take 10 mg by mouth daily.  12  . warfarin (COUMADIN) 7.5 MG tablet Take 1 tablet (7.5 mg total) by mouth daily at 6 PM. (Patient taking differently: Take 7.5 mg by mouth daily at 6 PM. OR AS DIRECTED) 30 tablet 3   No current facility-administered medications for this visit.       Physical Exam:   Ht 6' (1.829 m)   Wt 180 lb (81.6 kg)   BMI 24.41 kg/m   General:  Well-appearing  Chest:   Clear to auscultation  CV:   Regular rate and rhythm with mechanical heart sounds and soft systolic murmur  Incisions:  Healing nicely, sternum is stable  Abdomen:  Soft and nontender  Extremities:  Warm and well-perfused  Diagnostic Tests:  CHEST  2 VIEW  COMPARISON:  02/23/2017  FINDINGS: Postsurgical changes are again identified. Previously seen tubes and lines have been removed in the interval. The lungs are well aerated bilaterally. Minimal left lower lobe atelectasis and small effusion is seen but significantly improved from the prior examination. No acute bony abnormality is noted.  IMPRESSION: Left basilar density likely related atelectasis and small effusion. This has improved from the prior exam however.   Electronically Signed   By: Eulah Pont.D.  On: 03/20/2017 15:50   Impression:  Patient is doing remarkably well approximately one month following redo aortic valve replacement and coronary artery bypass grafting.  Plan:  We have not recommended any changes to the patient's current medications. I have encouraged the patient to continue to gradually increase his physical activity as tolerated with his primary limitation remaining that he refrain from heavy lifting or strenuous use of his arms or shoulders for at least 2 months. I think he may resume driving an automobile. I've encouraged him to enroll and  participate in outpatient cardiac rehabilitation program.  The patient will continue to follow-up closely with Dr. Eden Emms. He will return to our office in approximately 11 months. He will call and return sooner should specific problems or questions arise.   [name], MD 03/20/2017 3:51 PM

## 2017-03-21 ENCOUNTER — Ambulatory Visit (INDEPENDENT_AMBULATORY_CARE_PROVIDER_SITE_OTHER): Payer: BLUE CROSS/BLUE SHIELD | Admitting: *Deleted

## 2017-03-21 DIAGNOSIS — Z951 Presence of aortocoronary bypass graft: Secondary | ICD-10-CM

## 2017-03-21 DIAGNOSIS — Z952 Presence of prosthetic heart valve: Secondary | ICD-10-CM

## 2017-03-21 DIAGNOSIS — Z954 Presence of other heart-valve replacement: Secondary | ICD-10-CM

## 2017-03-21 DIAGNOSIS — Z5181 Encounter for therapeutic drug level monitoring: Secondary | ICD-10-CM | POA: Diagnosis not present

## 2017-03-21 LAB — POCT INR: INR: 1.7

## 2017-03-27 ENCOUNTER — Ambulatory Visit: Payer: BLUE CROSS/BLUE SHIELD

## 2017-03-28 ENCOUNTER — Ambulatory Visit (INDEPENDENT_AMBULATORY_CARE_PROVIDER_SITE_OTHER): Payer: BLUE CROSS/BLUE SHIELD | Admitting: *Deleted

## 2017-03-28 DIAGNOSIS — Z5181 Encounter for therapeutic drug level monitoring: Secondary | ICD-10-CM

## 2017-03-28 DIAGNOSIS — Z954 Presence of other heart-valve replacement: Secondary | ICD-10-CM

## 2017-03-28 DIAGNOSIS — Z952 Presence of prosthetic heart valve: Secondary | ICD-10-CM | POA: Diagnosis not present

## 2017-03-28 DIAGNOSIS — Z951 Presence of aortocoronary bypass graft: Secondary | ICD-10-CM | POA: Diagnosis not present

## 2017-03-28 LAB — POCT INR: INR: 2.2

## 2017-04-06 ENCOUNTER — Ambulatory Visit (INDEPENDENT_AMBULATORY_CARE_PROVIDER_SITE_OTHER): Payer: BLUE CROSS/BLUE SHIELD | Admitting: *Deleted

## 2017-04-06 DIAGNOSIS — Z5181 Encounter for therapeutic drug level monitoring: Secondary | ICD-10-CM

## 2017-04-06 DIAGNOSIS — Z951 Presence of aortocoronary bypass graft: Secondary | ICD-10-CM | POA: Diagnosis not present

## 2017-04-06 DIAGNOSIS — Z954 Presence of other heart-valve replacement: Secondary | ICD-10-CM | POA: Diagnosis not present

## 2017-04-06 DIAGNOSIS — Z952 Presence of prosthetic heart valve: Secondary | ICD-10-CM

## 2017-04-06 LAB — POCT INR: INR: 3.4

## 2017-04-11 ENCOUNTER — Encounter (HOSPITAL_COMMUNITY): Payer: Self-pay

## 2017-04-11 ENCOUNTER — Ambulatory Visit (HOSPITAL_COMMUNITY): Payer: BLUE CROSS/BLUE SHIELD

## 2017-04-11 ENCOUNTER — Encounter (HOSPITAL_COMMUNITY)
Admission: RE | Admit: 2017-04-11 | Discharge: 2017-04-11 | Disposition: A | Discharge status: Home / Self Care | Payer: BLUE CROSS/BLUE SHIELD | Source: Ambulatory Visit | Attending: Cardiovascular Disease | Admitting: Cardiovascular Disease

## 2017-04-11 VITALS — BP 118/72 | HR 77 | Ht 72.5 in | Wt 180.3 lb

## 2017-04-11 DIAGNOSIS — Z48812 Encounter for surgical aftercare following surgery on the circulatory system: Secondary | ICD-10-CM | POA: Insufficient documentation

## 2017-04-11 DIAGNOSIS — Z951 Presence of aortocoronary bypass graft: Secondary | ICD-10-CM | POA: Insufficient documentation

## 2017-04-11 NOTE — Progress Notes (Addendum)
Cardiac Individual Treatment Plan  Patient Details  Name: [name] MRN: [id number] Date of Birth: 1956-11-28 Referring Provider:     CARDIAC REHAB PHASE II ORIENTATION from 04/11/2017 in [facility] CARDIAC [facility]  Referring Provider  [name] MD      Initial Encounter Date:    CARDIAC REHAB PHASE II ORIENTATION from 04/11/2017 in [facility] CARDIAC REHAB  Date  04/11/17  Referring Provider  [name] MD      Visit Diagnosis: 02/21/17 S/P CABG x 3  Patient's Home Medications on Admission:  Current Outpatient Prescriptions:  .  amoxicillin (AMOXIL) 500 MG tablet, Take 2,000 mg by mouth once as needed (takes prior to dental surgeries). , Disp: , Rfl:  .  aspirin EC 81 MG tablet, Take 1 tablet (81 mg total) by mouth daily., Disp: 90 tablet, Rfl: 3 .  carvedilol (COREG) 3.125 MG tablet, Take 1 tablet (3.125 mg total) by mouth 2 (two) times daily with a meal., Disp: 60 tablet, Rfl: 1 .  citalopram (CELEXA) 10 MG tablet, Take 10 mg by mouth daily., Disp: , Rfl: 12 .  Multiple Vitamin (MULTIVITAMIN) tablet, Take 1 tablet by mouth daily., Disp: , Rfl:  .  warfarin (COUMADIN) 7.5 MG tablet, Take 1 tablet (7.5 mg total) by mouth daily at 6 PM. (Patient taking differently: Take 7.5 mg by mouth daily at 6 PM. OR AS DIRECTED), Disp: 30 tablet, Rfl: 3  Past Medical History: Past Medical History:  Diagnosis Date  . Aortic insufficiency   . Aortic valve disease    a. Biscuspid AVR with heavy annular calcification s/p tissue AVR (pt preferred). b. Mod-severe perivalvular regurg by cath 08/2013, mod by TEE 08/2013, gradients lower by TEE than by echo.   . Complication of anesthesia    when intubated  damage to vocal after surgery  2010  . Depression   . Frequent urination   . Hearing loss    bilateral, pt wears hearing aids   . Nonischemic cardiomyopathy (HCC)    a. EF normal immediately after AVR, then decreased to 40-45% by echo & 34% by MRI 2011. b.  Most recent EF 40-45% by echo/TEE 08/2013.  . Paravalvular leak of prosthetic heart valve   . Prosthetic valve dysfunction   . Reflux   . Rosacea   . S/P aortic valve replacement with bioprosthetic valve 12/08/2009   21mm [facility] Ease bovine pericardial tissue valve placed via right mini thoracotomy approach  . 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 Shortness of breath    with exertion  . Thrombocytopenia (HCC)     Tobacco Use: History  Smoking Status  . Never Smoker  Smokeless Tobacco  . Never Used    Labs: Recent Review Flowsheet Data    Labs for ITP Cardiac and Pulmonary Rehab Latest Ref Rng & Units 02/21/2017 02/21/2017 02/21/2017 02/22/2017 02/22/2017   Hemoglobin A1c 4.8 - 5.6 % - - - - -   PHART 7.350 - 7.450 7.386 - 7.348(L) 7.341(L) -   PCO2ART 32.0 - 48.0 mmHg 41.9 - 42.1 42.8 -   HCO3 20.0 - 28.0 mmol/L 25.3 - 23.2 23.2 -   TCO2 0 - 100 mmol/L 27 25 24 25 26    ACIDBASEDEF 0.0 - 2.0  mmol/L - - 2.0 2.0 -   O2SAT % 98.0 - 94.0 96.0 -      Capillary Blood Glucose: Lab Results  Component Value Date   GLUCAP 115 (H) 02/23/2017   GLUCAP 116 (H) 02/23/2017   GLUCAP 126 (H) 02/22/2017   GLUCAP 147 (H) 02/22/2017   GLUCAP 120 (H) 02/22/2017     Exercise Target Goals: Date: 04/11/17  Exercise Program Goal: Individual exercise prescription set with THRR, safety & activity barriers. Participant demonstrates ability to understand and report RPE using BORG scale, to self-measure pulse accurately, and to acknowledge the importance of the exercise prescription.  Exercise Prescription Goal: Starting with aerobic activity 30 plus minutes a day, 3 days per week for initial exercise prescription. Provide home exercise prescription and guidelines that participant acknowledges understanding  prior to discharge.  Activity Barriers & Risk Stratification:     Activity Barriers & Cardiac Risk Stratification - 04/11/17 1621      Activity Barriers & Cardiac Risk Stratification   Cardiac Risk Stratification High      6 Minute Walk:     6 Minute Walk    Row Name 04/11/17 1620         6 Minute Walk   Phase Initial     Distance 2064 feet     Walk Time 6 minutes     # of Rest Breaks 0     MPH 3.9     METS 5.16     RPE 10     VO2 Peak 18.08     Symptoms No     Resting HR 77 bpm     Resting BP 118/72     Max Ex. HR 102 bpm     Max Ex. BP 138/64     2 Minute Post BP 104/60        Oxygen Initial Assessment:   Oxygen Re-Evaluation:   Oxygen Discharge (Final Oxygen Re-Evaluation):   Initial Exercise Prescription:     Initial Exercise Prescription - 04/11/17 1600      Date of Initial Exercise RX and Referring Provider   Date 04/11/17   Referring Provider [name] MD     Treadmill   MPH 3.5   Grade 1   Minutes 10   METs 4.07     Bike   Level 1   Minutes 10   METs 3.36     NuStep   Level 3   SPM 90   Minutes 10   METs 2.5     Prescription Details   Frequency (times per week) 3   Duration Progress to 30 minutes of continuous aerobic without signs/symptoms of physical distress     Intensity   THRR 40-80% of Max Heartrate 64-128   Ratings of Perceived Exertion 11-13   Perceived Dyspnea 0-4     Progression   Progression Continue to progress workloads to maintain intensity without signs/symptoms of physical distress.     Resistance Training   Training Prescription Yes   Weight 3lbs   Reps 10-15      Perform Capillary Blood Glucose checks as needed.  Exercise Prescription Changes:   Exercise Comments:   Exercise Goals and Review:     Exercise Goals    Row Name 04/11/17 1406             Exercise Goals   Increase Physical Activity Yes       Intervention Provide advice, education, support and counseling about  physical activity/exercise needs.;Develop an individualized  exercise prescription for aerobic and resistive training based on initial evaluation findings, risk stratification, comorbidities and participant's personal goals.       Expected Outcomes Achievement of increased cardiorespiratory fitness and enhanced flexibility, muscular endurance and strength shown through measurements of functional capacity and personal statement of participant.       Increase Strength and Stamina Yes  Be able to perform exercises in confidence and without fear of having another cardiac event.         Intervention Provide advice, education, support and counseling about physical activity/exercise needs.;Develop an individualized exercise prescription for aerobic and resistive training based on initial evaluation findings, risk stratification, comorbidities and participant's personal goals.       Expected Outcomes Achievement of increased cardiorespiratory fitness and enhanced flexibility, muscular endurance and strength shown through measurements of functional capacity and personal statement of participant.          Exercise Goals Re-Evaluation :    Discharge Exercise Prescription (Final Exercise Prescription Changes):   Nutrition:  Target Goals: Understanding of nutrition guidelines, daily intake of sodium 1500mg , cholesterol 200mg , calories 30% from fat and 7% or less from saturated fats, daily to have 5 or more servings of fruits and vegetables.  Biometrics:     Pre Biometrics - 04/11/17 1625      Pre Biometrics   Waist Circumference 35.75 inches   Hip Circumference 38.75 inches   Waist to Hip Ratio 0.92 %   Triceps Skinfold 13 mm   % Body Fat 23 %   Grip Strength 38 kg   Flexibility 10 in   Single Leg Stand 30 seconds       Nutrition Therapy Plan and Nutrition Goals:   Nutrition Discharge: Nutrition Scores:   Nutrition Goals Re-Evaluation:   Nutrition Goals Re-Evaluation:   Nutrition  Goals Discharge (Final Nutrition Goals Re-Evaluation):   Psychosocial: Target Goals: Acknowledge presence or absence of significant depression and/or stress, maximize coping skills, provide positive support system. Participant is able to verbalize types and ability to use techniques and skills needed for reducing stress and depression.  Initial Review & Psychosocial Screening:     Initial Psych Review & Screening - 04/11/17 1437      Initial Review   Current issues with --  citalopram      Family Dynamics   Good Support System? --  church, (mens group, SS class) friends   Comments recent seperation from wife     Barriers   Psychosocial barriers to participate in program Psychosocial barriers identified (see note)     Screening Interventions   Interventions Encouraged to exercise;Provide feedback about the scores to participant      Quality of Life Scores:   PHQ-9: Recent Review Flowsheet Data    There is no flowsheet data to display.     Interpretation of Total Score  Total Score Depression Severity:  1-4 = Minimal depression, 5-9 = Mild depression, 10-14 = Moderate depression, 15-19 = Moderately severe depression, 20-27 = Severe depression   Psychosocial Evaluation and Intervention:   Psychosocial Re-Evaluation:   Psychosocial Discharge (Final Psychosocial Re-Evaluation):   Vocational Rehabilitation: Provide vocational rehab assistance to qualifying candidates.   Vocational Rehab Evaluation & Intervention:     Vocational Rehab - 04/11/17 1434      Initial Vocational Rehab Evaluation & Intervention   Assessment shows need for Vocational Rehabilitation No      Education: Education Goals: Education classes will be provided on a weekly basis, covering required topics. Participant will  state understanding/return demonstration of topics presented.  Learning Barriers/Preferences:     Learning Barriers/Preferences - 04/11/17 1405      Learning  Barriers/Preferences   Learning Barriers Sight;Hearing   Learning Preferences Written Material;Video;Pictoral;Group Instruction      Education Topics: Count Your Pulse:  -Group instruction provided by verbal instruction, demonstration, patient participation and written materials to support subject.  Instructors address importance of being able to find your pulse and how to count your pulse when at home without a heart monitor.  Patients get hands on experience counting their pulse with staff help and individually.   Heart Attack, Angina, and Risk Factor Modification:  -Group instruction provided by verbal instruction, video, and written materials to support subject.  Instructors address signs and symptoms of angina and heart attacks.    Also discuss risk factors for heart disease and how to make changes to improve heart health risk factors.   Functional Fitness:  -Group instruction provided by verbal instruction, demonstration, patient participation, and written materials to support subject.  Instructors address safety measures for doing things around the house.  Discuss how to get up and down off the floor, how to pick things up properly, how to safely get out of a chair without assistance, and balance training.   Meditation and Mindfulness:  -Group instruction provided by verbal instruction, patient participation, and written materials to support subject.  Instructor addresses importance of mindfulness and meditation practice to help reduce stress and improve awareness.  Instructor also leads participants through a meditation exercise.    Stretching for Flexibility and Mobility:  -Group instruction provided by verbal instruction, patient participation, and written materials to support subject.  Instructors lead participants through series of stretches that are designed to increase flexibility thus improving mobility.  These stretches are additional exercise for major muscle groups that are  typically performed during regular warm up and cool down.   Hands Only CPR Anytime:  -Group instruction provided by verbal instruction, video, patient participation and written materials to support subject.  Instructors co-teach with AHA video for hands only CPR.  Participants get hands on experience with mannequins.   Nutrition I class: Heart Healthy Eating:  -Group instruction provided by PowerPoint slides, verbal discussion, and written materials to support subject matter. The instructor gives an explanation and review of the Therapeutic Lifestyle Changes diet recommendations, which includes a discussion on lipid goals, dietary fat, sodium, fiber, plant stanol/sterol esters, sugar, and the components of a well-balanced, healthy diet.   Nutrition II class: Lifestyle Skills:  -Group instruction provided by PowerPoint slides, verbal discussion, and written materials to support subject matter. The instructor gives an explanation and review of label reading, grocery shopping for heart health, heart healthy recipe modifications, and ways to make healthier choices when eating out.   Diabetes Question & Answer:  -Group instruction provided by PowerPoint slides, verbal discussion, and written materials to support subject matter. The instructor gives an explanation and review of diabetes co-morbidities, pre- and post-prandial blood glucose goals, pre-exercise blood glucose goals, signs, symptoms, and treatment of hypoglycemia and hyperglycemia, and foot care basics.   Diabetes Blitz:  -Group instruction provided by PowerPoint slides, verbal discussion, and written materials to support subject matter. The instructor gives an explanation and review of the physiology behind type 1 and type 2 diabetes, diabetes medications and rational behind using different medications, pre- and post-prandial blood glucose recommendations and Hemoglobin A1c goals, diabetes diet, and exercise including blood glucose  guidelines for exercising safely.  Portion Distortion:  -Group instruction provided by PowerPoint slides, verbal discussion, written materials, and food models to support subject matter. The instructor gives an explanation of serving size versus portion size, changes in portions sizes over the last 20 years, and what consists of a serving from each food group.   Stress Management:  -Group instruction provided by verbal instruction, video, and written materials to support subject matter.  Instructors review role of stress in heart disease and how to cope with stress positively.     Exercising on Your Own:  -Group instruction provided by verbal instruction, power point, and written materials to support subject.  Instructors discuss benefits of exercise, components of exercise, frequency and intensity of exercise, and end points for exercise.  Also discuss use of nitroglycerin and activating EMS.  Review options of places to exercise outside of rehab.  Review guidelines for sex with heart disease.   Cardiac Drugs I:  -Group instruction provided by verbal instruction and written materials to support subject.  Instructor reviews cardiac drug classes: antiplatelets, anticoagulants, beta blockers, and statins.  Instructor discusses reasons, side effects, and lifestyle considerations for each drug class.   Cardiac Drugs II:  -Group instruction provided by verbal instruction and written materials to support subject.  Instructor reviews cardiac drug classes: angiotensin converting enzyme inhibitors (ACE-I), angiotensin II receptor blockers (ARBs), nitrates, and calcium channel blockers.  Instructor discusses reasons, side effects, and lifestyle considerations for each drug class.   Anatomy and Physiology of the Circulatory System:  -Group instruction provided by verbal instruction, video, and written materials to support subject.  Reviews functional anatomy of heart, how it relates to various  diagnoses, and what role the heart plays in the overall system.   Knowledge Questionnaire Score:   Core Components/Risk Factors/Patient Goals at Admission:     Personal Goals and Risk Factors at Admission - 04/11/17 1626      Core Components/Risk Factors/Patient Goals on Admission   Hypertension Yes   Intervention Provide education on lifestyle modifcations including regular physical activity/exercise, weight management, moderate sodium restriction and increased consumption of fresh fruit, vegetables, and low fat dairy, alcohol moderation, and smoking cessation.;Monitor prescription use compliance.   Expected Outcomes Short Term: Continued assessment and intervention until BP is < 140/58mm HG in hypertensive participants. < 130/1mm HG in hypertensive participants with diabetes, heart failure or chronic kidney disease.;Long Term: Maintenance of blood pressure at goal levels.   Stress Yes   Intervention Offer individual and/or small group education and counseling on adjustment to heart disease, stress management and health-related lifestyle change. Teach and support self-help strategies.;Refer participants experiencing significant psychosocial distress to appropriate mental health specialists for further evaluation and treatment. When possible, include family members and significant others in education/counseling sessions.   Expected Outcomes Short Term: Participant demonstrates changes in health-related behavior, relaxation and other stress management skills, ability to obtain effective social support, and compliance with psychotropic medications if prescribed.;Long Term: Emotional wellbeing is indicated by absence of clinically significant psychosocial distress or social isolation.      Core Components/Risk Factors/Patient Goals Review:    Core Components/Risk Factors/Patient Goals at Discharge (Final Review):    ITP Comments:     ITP Comments    Row Name 04/11/17 1354            ITP Comments Dr. Armanda Magic, Medical Director          Comments: Patient attended orientation from 1344 to 1507 to review rules and guidelines for program. Completed 6  minute walk test, Intitial ITP, and exercise prescription.  VSS. Telemetry-sinus rhythm, frequent PVC, bigeminal at times.  Asymptomatic.  Pt homework packet not available at orientation. Pt will bring to next scheduled appt.

## 2017-04-12 DIAGNOSIS — F4323 Adjustment disorder with mixed anxiety and depressed mood: Secondary | ICD-10-CM | POA: Diagnosis not present

## 2017-04-17 ENCOUNTER — Ambulatory Visit (INDEPENDENT_AMBULATORY_CARE_PROVIDER_SITE_OTHER): Payer: BLUE CROSS/BLUE SHIELD | Admitting: *Deleted

## 2017-04-17 DIAGNOSIS — Z952 Presence of prosthetic heart valve: Secondary | ICD-10-CM

## 2017-04-17 DIAGNOSIS — Z954 Presence of other heart-valve replacement: Secondary | ICD-10-CM | POA: Diagnosis not present

## 2017-04-17 DIAGNOSIS — Z5181 Encounter for therapeutic drug level monitoring: Secondary | ICD-10-CM | POA: Diagnosis not present

## 2017-04-17 DIAGNOSIS — Z951 Presence of aortocoronary bypass graft: Secondary | ICD-10-CM

## 2017-04-17 LAB — POCT INR: INR: 3

## 2017-04-19 ENCOUNTER — Encounter (HOSPITAL_COMMUNITY): Payer: Self-pay

## 2017-04-19 ENCOUNTER — Encounter (HOSPITAL_COMMUNITY)
Admission: RE | Admit: 2017-04-19 | Discharge: 2017-04-19 | Disposition: A | Discharge status: Home / Self Care | Payer: BLUE CROSS/BLUE SHIELD | Source: Ambulatory Visit | Attending: Cardiovascular Disease | Admitting: Cardiovascular Disease

## 2017-04-19 DIAGNOSIS — Z48812 Encounter for surgical aftercare following surgery on the circulatory system: Secondary | ICD-10-CM | POA: Diagnosis not present

## 2017-04-19 DIAGNOSIS — Z951 Presence of aortocoronary bypass graft: Secondary | ICD-10-CM | POA: Diagnosis not present

## 2017-04-19 NOTE — Progress Notes (Addendum)
Daily Session Note  Patient Details  Name: [name] MRN: [id number] Date of Birth: [birth year] Referring Provider:     CARDIAC REHAB PHASE II ORIENTATION from 04/11/2017 in [place]  Referring Provider  Jenkins Rouge MD      Encounter Date: 04/19/2017  Check In:     Session Check In - 04/19/17 0856      Check-In   Location [facility]   Staff Present Dorna Bloom, MS, ACSM RCEP, Exercise Physiologist;Advika Mclelland, RN, Deland Pretty, MS, ACSM CEP, Exercise Physiologist;Maria Whitaker, RN, BSN   Supervising physician immediately available to respond to emergencies [facility] immediately available   Physician(s) Dr. Posey Pronto   Medication changes reported     No   Fall or balance concerns reported    No   Tobacco Cessation No Change   Warm-up and Cool-down Performed as group-led instruction   Resistance Training Performed No   VAD Patient? No     Pain Assessment   Currently in Pain? No/denies   Multiple Pain Sites No      Capillary Blood Glucose: No results found for this or any previous visit (from the past 24 hour(s)).      Exercise Prescription Changes - 04/19/17 1600      Response to Exercise   Blood Pressure (Admit) 104/64   Blood Pressure (Exercise) 144/72   Blood Pressure (Exit) 114/80   Heart Rate (Admit) 71 bpm   Heart Rate (Exercise) 107 bpm   Heart Rate (Exit) 68 bpm   Rating of Perceived Exertion (Exercise) 13   Symptoms none   Comments Pt was oriented to exercise equipment on 4/45/18   Duration Continue with 30 min of aerobic exercise without signs/symptoms of physical distress.   Intensity THRR unchanged     Progression   Progression Continue to progress workloads to maintain intensity without signs/symptoms of physical distress.   Average METs 3.25     Resistance Training   Training Prescription Yes   Weight 3lbs   Reps 10-15   Time 10 Minutes     Treadmill   MPH 3.5   Grade 1   Minutes 10   METs 4.07     Bike   Level 1   Minutes 10   METs 3.36     NuStep   Level 3   SPM 90   Minutes 10   METs 2.3      History  Smoking Status  . Never Smoker  Smokeless Tobacco  . Never Used    Goals Met:  Exercise tolerated well  Goals Unmet:  Not Applicable  Comments: Pt started cardiac rehab today.  Pt tolerated light exercise without difficulty. VSS, telemetry-sinus  asymptomatic.  Medication list reconciled. Pt denies barriers to medicaiton compliance.  PSYCHOSOCIAL ASSESSMENT:  PHQ-0. Pt exhibits positive coping skills, hopeful outlook with supportive friends.   Pt has diagnosed situational depression currently well managed.  Pt is treated by his PCP.   Pt enjoys sailing, hiking, reading and watching TV. Pt goals for cardiac rehab are to improve heart health, develop exercise routine with vigorous aerobic capacity.  Pt is also seeking reassurance about palpitations at rest.  Will continue to monitor and discuss concern with cardiology.    Pt oriented to exercise equipment and routine.    Understanding verbalized.   Dr. Fransico Him is Medical Director for Cardiac Rehab at [facility].

## 2017-04-21 ENCOUNTER — Encounter (HOSPITAL_COMMUNITY)
Admission: RE | Admit: 2017-04-21 | Discharge: 2017-04-21 | Disposition: A | Discharge status: Home / Self Care | Payer: BLUE CROSS/BLUE SHIELD | Source: Ambulatory Visit | Attending: Cardiovascular Disease | Admitting: Cardiovascular Disease

## 2017-04-21 DIAGNOSIS — Z951 Presence of aortocoronary bypass graft: Secondary | ICD-10-CM | POA: Diagnosis not present

## 2017-04-21 DIAGNOSIS — Z48812 Encounter for surgical aftercare following surgery on the circulatory system: Secondary | ICD-10-CM | POA: Diagnosis not present

## 2017-04-23 ENCOUNTER — Other Ambulatory Visit: Payer: Self-pay | Admitting: Physician Assistant

## 2017-04-24 ENCOUNTER — Encounter (HOSPITAL_COMMUNITY)
Admission: RE | Admit: 2017-04-24 | Discharge: 2017-04-24 | Disposition: A | Discharge status: Home / Self Care | Payer: BLUE CROSS/BLUE SHIELD | Source: Ambulatory Visit | Attending: Cardiovascular Disease | Admitting: Cardiovascular Disease

## 2017-04-24 DIAGNOSIS — Z951 Presence of aortocoronary bypass graft: Secondary | ICD-10-CM

## 2017-04-24 DIAGNOSIS — Z48812 Encounter for surgical aftercare following surgery on the circulatory system: Secondary | ICD-10-CM | POA: Diagnosis not present

## 2017-04-26 ENCOUNTER — Encounter (HOSPITAL_COMMUNITY)
Admission: RE | Admit: 2017-04-26 | Discharge: 2017-04-26 | Disposition: A | Discharge status: Home / Self Care | Payer: BLUE CROSS/BLUE SHIELD | Source: Ambulatory Visit | Attending: Cardiovascular Disease | Admitting: Cardiovascular Disease

## 2017-04-26 DIAGNOSIS — Z951 Presence of aortocoronary bypass graft: Secondary | ICD-10-CM | POA: Insufficient documentation

## 2017-04-26 DIAGNOSIS — Z48812 Encounter for surgical aftercare following surgery on the circulatory system: Secondary | ICD-10-CM | POA: Insufficient documentation

## 2017-04-26 DIAGNOSIS — F4323 Adjustment disorder with mixed anxiety and depressed mood: Secondary | ICD-10-CM | POA: Diagnosis not present

## 2017-04-27 ENCOUNTER — Other Ambulatory Visit: Payer: Self-pay | Admitting: Physician Assistant

## 2017-04-28 ENCOUNTER — Encounter (HOSPITAL_COMMUNITY)
Admission: RE | Admit: 2017-04-28 | Discharge: 2017-04-28 | Disposition: A | Discharge status: Home / Self Care | Payer: BLUE CROSS/BLUE SHIELD | Source: Ambulatory Visit | Attending: Cardiovascular Disease | Admitting: Cardiovascular Disease

## 2017-04-28 DIAGNOSIS — Z951 Presence of aortocoronary bypass graft: Secondary | ICD-10-CM

## 2017-04-28 DIAGNOSIS — Z48812 Encounter for surgical aftercare following surgery on the circulatory system: Secondary | ICD-10-CM | POA: Diagnosis not present

## 2017-04-28 NOTE — Progress Notes (Signed)
Reviewed home exercise with pt today.  Pt plans to walk for exercise 2-3x/week in addition to coming to cardiac rehab. Encouraged 150 minutes of aerobic activity per week.  Reviewed THR, pulse, RPE, sign and symptoms,and when to call 911 or MD.  Also discussed weather considerations and indoor options.  Pt voiced understanding.   [name] [employer]

## 2017-05-01 ENCOUNTER — Encounter (HOSPITAL_COMMUNITY)
Admission: RE | Admit: 2017-05-01 | Discharge: 2017-05-01 | Disposition: A | Discharge status: Home / Self Care | Payer: BLUE CROSS/BLUE SHIELD | Source: Ambulatory Visit | Attending: Cardiovascular Disease | Admitting: Cardiovascular Disease

## 2017-05-01 ENCOUNTER — Other Ambulatory Visit: Payer: Self-pay | Admitting: Cardiovascular Disease

## 2017-05-01 DIAGNOSIS — Z48812 Encounter for surgical aftercare following surgery on the circulatory system: Secondary | ICD-10-CM | POA: Diagnosis not present

## 2017-05-01 DIAGNOSIS — Z951 Presence of aortocoronary bypass graft: Secondary | ICD-10-CM

## 2017-05-01 NOTE — Telephone Encounter (Signed)
Medication  carvedilol (COREG) tablet 3.125 mg [18551]  carvedilol (COREG) tablet 3.125 mg  [[id number]]   Ordered Dose: 3.125 mg Route: Oral Frequency: 2 times daily with meals  Administration Dose: 3.125 mg     Start: 02/23/17 0830 End: 02/27/17 1956 First Dose: As Scheduled    Admin Instructions:  Hold for HR less than 60 and/or SBP less than 100  (BETA BLOCKER)        Order Status: Discontinued  Ordering User: Purcell Nails, MD Ordering Date/Time: Thu Feb 23, 2017 0814  Ordering Provider: Purcell Nails, MD Authorizing Provider: Purcell Nails, MD  D/C User: Discharge Provider, Automatic D/C Date/Time: Mon Feb 27, 2017 1956  D/C Reason: Patient Discharge D/C Verified By: Not Verified

## 2017-05-01 NOTE — Progress Notes (Signed)
[name] 61 y.o. male Nutrition Note Spoke with pt. Pt has been in the program previously Nutrition Survey reviewed with pt. Pt is following Step 1 of the Therapeutic Lifestyle Changes diet. Pt is on Coumadin and is aware of the need to follow a diet with consistent vitamin K intake. Pt expressed understanding of the information reviewed. Pt aware of nutrition education classes offered. Lab Results  Component Value Date   HGBA1C 4.9 02/17/2017   Wt Readings from Last 3 Encounters:  04/11/17 180 lb 5.4 oz (81.8 kg)  03/20/17 180 lb (81.6 kg)  03/14/17 180 lb 12 oz (82 kg)   Nutrition Diagnosis Food-and nutrition-related knowledge deficit related to lack of exposure to information as related to diagnosis of: ? CVD  Nutrition Intervention ? Benefits of adopting Therapeutic Lifestyle Changes discussed when Medficts reviewed. ? Pt to attend the Portion Distortion class ? Pt to attend the  ? Nutrition I class                      ? Nutrition II class ? Continue client-centered nutrition education by RD, as part of interdisciplinary care.  Goal(s) ? Pt to decrease simple carbohydrates in the diet by watching out for sweets and added sugar.  Monitor and Evaluate progress toward nutrition goal with team.  Mickle Plumb, M.Ed, RD, LDN, CDE 05/01/2017 9:56 AM

## 2017-05-01 NOTE — Progress Notes (Signed)
Called Dr. Cornelius Moras on pt's behalf. Appointment scheduled for pt to be seen on next Monday 05/08/17 at 1:15 for cxray and 1;45 for appt to be seen by Dr. Cornelius Moras.  Relayed this information to the pt who verbalized understanding and is in agreement of this. Alanson Aly, BSN Cardiac and Emergency planning/management officer

## 2017-05-01 NOTE — Progress Notes (Signed)
Pt presented to cardiac rehab this morning for exercise.  Pt asked rehab staff to take a look at his chest.  Pt noticed for about a week when he would take a forceful breath an area in between his ribs on the left side would balloon out and when he would take a forcefull inspiration.  Pt denies any pain or discomfort when he takes a deep breath but does have some mild shoulder discomfort. Pt had mini thoracotomy about 7 years ago.  The ballooning is right above the incision.  Paged Dr. Cornelius Moras, who performed both surgeries and is on call this morning.  Relayed information to Dr. Cornelius Moras.  Dr. Cornelius Moras indicated that this ws not an urgent medical issue and pt was okay to exercise. Asked if pt needed to be seen in the office, Dr. Cornelius Moras indicated he didn't need to unless the pt wanted to.  Conveyed this information to the pt.  Pt would like to be seen in the office this week for peace of mind.  Will call the office when they open at 8:30. Alanson Aly, BSN Cardiac and Emergency planning/management officer

## 2017-05-02 ENCOUNTER — Ambulatory Visit (INDEPENDENT_AMBULATORY_CARE_PROVIDER_SITE_OTHER): Payer: BLUE CROSS/BLUE SHIELD | Admitting: Pharmacist

## 2017-05-02 DIAGNOSIS — Z951 Presence of aortocoronary bypass graft: Secondary | ICD-10-CM

## 2017-05-02 DIAGNOSIS — Z5181 Encounter for therapeutic drug level monitoring: Secondary | ICD-10-CM

## 2017-05-02 DIAGNOSIS — Z954 Presence of other heart-valve replacement: Secondary | ICD-10-CM

## 2017-05-02 DIAGNOSIS — Z952 Presence of prosthetic heart valve: Secondary | ICD-10-CM

## 2017-05-02 LAB — POCT INR: INR: 2.5

## 2017-05-02 MED ORDER — CARVEDILOL 3.125 MG PO TABS: 3.1250 mg | ORAL_TABLET | Freq: Two times a day (BID) | ORAL | 1 refills | Status: DC

## 2017-05-03 ENCOUNTER — Encounter (HOSPITAL_COMMUNITY)
Admission: RE | Admit: 2017-05-03 | Discharge: 2017-05-03 | Disposition: A | Discharge status: Home / Self Care | Payer: BLUE CROSS/BLUE SHIELD | Source: Ambulatory Visit | Attending: Cardiovascular Disease | Admitting: Cardiovascular Disease

## 2017-05-03 DIAGNOSIS — Z951 Presence of aortocoronary bypass graft: Secondary | ICD-10-CM | POA: Diagnosis not present

## 2017-05-03 DIAGNOSIS — Z48812 Encounter for surgical aftercare following surgery on the circulatory system: Secondary | ICD-10-CM | POA: Diagnosis not present

## 2017-05-05 ENCOUNTER — Encounter (HOSPITAL_COMMUNITY)
Admission: RE | Admit: 2017-05-05 | Discharge: 2017-05-05 | Disposition: A | Discharge status: Home / Self Care | Payer: BLUE CROSS/BLUE SHIELD | Source: Ambulatory Visit | Attending: Cardiovascular Disease | Admitting: Cardiovascular Disease

## 2017-05-05 DIAGNOSIS — Z48812 Encounter for surgical aftercare following surgery on the circulatory system: Secondary | ICD-10-CM | POA: Diagnosis not present

## 2017-05-05 DIAGNOSIS — Z951 Presence of aortocoronary bypass graft: Secondary | ICD-10-CM

## 2017-05-08 ENCOUNTER — Ambulatory Visit
Admission: RE | Admit: 2017-05-08 | Discharge: 2017-05-08 | Disposition: A | Discharge status: Home / Self Care | Payer: BLUE CROSS/BLUE SHIELD | Source: Ambulatory Visit | Attending: Thoracic Surgery (Cardiothoracic Vascular Surgery) | Admitting: Thoracic Surgery (Cardiothoracic Vascular Surgery)

## 2017-05-08 ENCOUNTER — Other Ambulatory Visit: Payer: Self-pay | Admitting: Thoracic Surgery (Cardiothoracic Vascular Surgery)

## 2017-05-08 ENCOUNTER — Ambulatory Visit (INDEPENDENT_AMBULATORY_CARE_PROVIDER_SITE_OTHER): Payer: BLUE CROSS/BLUE SHIELD | Admitting: Physician Assistant

## 2017-05-08 ENCOUNTER — Other Ambulatory Visit: Payer: Self-pay | Admitting: *Deleted

## 2017-05-08 ENCOUNTER — Encounter (HOSPITAL_COMMUNITY)
Admission: RE | Admit: 2017-05-08 | Discharge: 2017-05-08 | Disposition: A | Discharge status: Home / Self Care | Payer: BLUE CROSS/BLUE SHIELD | Source: Ambulatory Visit | Attending: Cardiovascular Disease | Admitting: Cardiovascular Disease

## 2017-05-08 VITALS — BP 133/74 | HR 80 | Resp 16 | Ht 72.0 in | Wt 175.0 lb

## 2017-05-08 DIAGNOSIS — Z954 Presence of other heart-valve replacement: Secondary | ICD-10-CM | POA: Diagnosis not present

## 2017-05-08 DIAGNOSIS — Z951 Presence of aortocoronary bypass graft: Secondary | ICD-10-CM

## 2017-05-08 DIAGNOSIS — R05 Cough: Secondary | ICD-10-CM

## 2017-05-08 DIAGNOSIS — R0789 Other chest pain: Secondary | ICD-10-CM | POA: Diagnosis not present

## 2017-05-08 DIAGNOSIS — Z952 Presence of prosthetic heart valve: Secondary | ICD-10-CM

## 2017-05-08 DIAGNOSIS — Z48812 Encounter for surgical aftercare following surgery on the circulatory system: Secondary | ICD-10-CM | POA: Diagnosis not present

## 2017-05-08 DIAGNOSIS — F4323 Adjustment disorder with mixed anxiety and depressed mood: Secondary | ICD-10-CM | POA: Diagnosis not present

## 2017-05-08 DIAGNOSIS — R071 Chest pain on breathing: Secondary | ICD-10-CM

## 2017-05-08 NOTE — Progress Notes (Signed)
[name] is a 61 y.o. male patient. This patient s/p CABG x 3 and redo vale replacement (mechanical) by [name] [name] 02/21/2017. He presents today for evaluation of an out pouching that develops when coughing that is located above his mini thoracotomy incision from 7 years ago.   1. S/P redo aortic valve replacement with bileaflet mechanical valve and CABG x3    Past Medical History:  Diagnosis Date  . Aortic insufficiency   . Aortic valve disease    a. Biscuspid AVR with heavy annular calcification s/p tissue AVR (pt preferred). b. Mod-severe perivalvular regurg by cath 08/2013, mod by TEE 08/2013, gradients lower by TEE than by echo.   . Complication of anesthesia    when intubated  damage to vocal after surgery  2010  . Depression   . Frequent urination   . Hearing loss    bilateral, pt wears hearing aids   . Nonischemic cardiomyopathy (HCC)    a. EF normal immediately after AVR, then decreased to 40-45% by echo & 34% by MRI 2011. b. Most recent EF 40-45% by echo/TEE 08/2013.  . Paravalvular leak of prosthetic heart valve   . Prosthetic valve dysfunction   . Reflux   . Rosacea   . S/P aortic valve replacement with bioprosthetic valve 12/08/2009   70mm [facility] Ease bovine pericardial tissue valve placed via right mini thoracotomy approach  . 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 Shortness of breath    with exertion  . Thrombocytopenia (HCC)    No past surgical history pertinent negatives [name] file. Scheduled Meds: Current Outpatient Prescriptions [name] File Prior to Visit  Medication Sig Dispense Refill  . amoxicillin (AMOXIL) 500 MG tablet Take 2,000 mg by mouth once as needed (takes prior to dental surgeries).     Marland Kitchen aspirin EC 81 MG tablet Take 1 tablet (81 mg total) by mouth daily. 90 tablet 3   . carvedilol (COREG) 3.125 MG tablet Take 1 tablet (3.125 mg total) by mouth 2 (two) times daily with a meal. 180 tablet 1  . citalopram (CELEXA) 10 MG tablet Take 10 mg by mouth daily.  12  . Multiple Vitamin (MULTIVITAMIN) tablet Take 1 tablet by mouth daily.    Marland Kitchen warfarin (COUMADIN) 7.5 MG tablet Take 1 tablet (7.5 mg total) by mouth daily at 6 PM. (Patient taking differently: Take 7.5 mg by mouth daily at 6 PM. OR AS DIRECTED) 30 tablet 3   No current facility-administered medications [name] file prior to visit.     Allergies  Allergen Reactions  . No Known Allergies     Blood pressure 133/74, pulse 80, resp. rate 16, height 6' (1.829 m), weight 79.4 kg (175 lb), SpO2 95 %.  Subjective : The patient is here today for evaluation of an out pouching that develops when coughing that is located above his mini thoracotomy incision from 7 years ago. He states that the area is tender to touch. He also shares that he has had a cough over the last few weeks which has irritated the area. I sent the patient down for a chest xray since one had not been ordered. While reviewing the op note it was documented that the second and third costal cartilages were repair utilizing Synthes plates.  Objective: Thoracic chest wall- There is an outpouching that occurs above the mini thoracotomy incision most prominently when the patient coughs.  Cor-NSR, no murmur  Pulm-left lower lobe with rhonchi, all other fields CTA  Abd-soft nontender Ext-no edema Wound-sternotomy well healed. EVH sites no erythema and well healed. Right thoracotomy well healed.  Assessment & Plan:  [name] presents today for an evaluation of his right chest wall. He shares that he has had an outpouching occur especially with coughing. The area is also tender to touch. He has had several weeks of a productive cough. He has not had any fever or any other signs of infection. I suggested Mucinex without the decongestant for his cough. [name] his  chest x-ray there were no significant changes from previous studies. I did review the chest x-ray with [name] and his fixation site near his right mini thoracotomy is stable without significant changes. [name] suspected that this is likely a hernia from his mini thoracotomy 7 years ago. The patient states that he has been increasing his exercise routine significantly. I suggested avoiding any chest or back exercises at least for the next week. [name] wanted a noncontrast CT scan of the chest to further evaluate the herniation. This will be scheduled for later this week, and the patient will return to see [name] next week. In the meantime I suggested the patient concentrated [name] his lower body since he does enjoy exercise. He understands his restrictions. I assured him that he was in no serious danger with the herniation. I went over the chest x-ray with him in great detail. I answered all of his questions to the patient's satisfaction. There were no further questions at this time. We will see [name] next week for his follow-up visit after his CT scan of the chest. I confirm that the patient was taking his Coumadin and his last INR was 2.5.  [name] 05/08/2017

## 2017-05-08 NOTE — Patient Instructions (Signed)
Please return to our office next week following year noncontrast CT of the chest. No chest or back exercises at this time. It is okay to exercise lower extremity. No heavy weights with upper extremity.

## 2017-05-10 ENCOUNTER — Ambulatory Visit
Admission: RE | Admit: 2017-05-10 | Discharge: 2017-05-10 | Disposition: A | Discharge status: Home / Self Care | Payer: BLUE CROSS/BLUE SHIELD | Source: Ambulatory Visit | Attending: Physician Assistant | Admitting: Physician Assistant

## 2017-05-10 ENCOUNTER — Encounter (HOSPITAL_COMMUNITY)
Admission: RE | Admit: 2017-05-10 | Discharge: 2017-05-10 | Disposition: A | Discharge status: Home / Self Care | Payer: BLUE CROSS/BLUE SHIELD | Source: Ambulatory Visit | Attending: Cardiovascular Disease | Admitting: Cardiovascular Disease

## 2017-05-10 DIAGNOSIS — Z48812 Encounter for surgical aftercare following surgery on the circulatory system: Secondary | ICD-10-CM | POA: Diagnosis not present

## 2017-05-10 DIAGNOSIS — Z951 Presence of aortocoronary bypass graft: Secondary | ICD-10-CM

## 2017-05-10 DIAGNOSIS — J181 Lobar pneumonia, unspecified organism: Secondary | ICD-10-CM | POA: Diagnosis not present

## 2017-05-10 DIAGNOSIS — Z952 Presence of prosthetic heart valve: Secondary | ICD-10-CM

## 2017-05-10 DIAGNOSIS — R071 Chest pain on breathing: Secondary | ICD-10-CM

## 2017-05-12 ENCOUNTER — Encounter (HOSPITAL_COMMUNITY)
Admission: RE | Admit: 2017-05-12 | Discharge: 2017-05-12 | Disposition: A | Discharge status: Home / Self Care | Payer: BLUE CROSS/BLUE SHIELD | Source: Ambulatory Visit | Attending: Cardiovascular Disease | Admitting: Cardiovascular Disease

## 2017-05-12 DIAGNOSIS — Z951 Presence of aortocoronary bypass graft: Secondary | ICD-10-CM

## 2017-05-12 DIAGNOSIS — Z48812 Encounter for surgical aftercare following surgery on the circulatory system: Secondary | ICD-10-CM | POA: Diagnosis not present

## 2017-05-15 ENCOUNTER — Encounter (HOSPITAL_COMMUNITY)
Admission: RE | Admit: 2017-05-15 | Discharge: 2017-05-15 | Disposition: A | Discharge status: Home / Self Care | Payer: BLUE CROSS/BLUE SHIELD | Source: Ambulatory Visit | Attending: Cardiovascular Disease | Admitting: Cardiovascular Disease

## 2017-05-15 ENCOUNTER — Telehealth (HOSPITAL_COMMUNITY): Payer: Self-pay | Admitting: Cardiac Rehabilitation

## 2017-05-15 ENCOUNTER — Telehealth: Payer: Self-pay

## 2017-05-15 DIAGNOSIS — Z48812 Encounter for surgical aftercare following surgery on the circulatory system: Secondary | ICD-10-CM | POA: Diagnosis not present

## 2017-05-15 DIAGNOSIS — Z951 Presence of aortocoronary bypass graft: Secondary | ICD-10-CM | POA: Diagnosis not present

## 2017-05-15 NOTE — Telephone Encounter (Signed)
Cardiac Rehab called about pt's CT Chest that was done 5/16.  I spoke with Dr. Cornelius Moras regarding the results.  He indicated the scan looked ok and didn't show pneumonia.  I called the patient regarding the results, offered him an o/v appt for today.  He is unable to come to the office due to a scheduled work meeting.  I told the pt to f/u with his PCP on 5/22 and call our office if needed.

## 2017-05-15 NOTE — Telephone Encounter (Signed)
Pt arrived at cardiac rehab with CT chest written report he received from [facility] radiology on 05/12/17.  Pt unsure how to interpret findings.  Overview explained to patient  PC to Dr. Barry Dienes office to request MD recommendation.   RN will review results with Dr. Cornelius Moras and contact pt.  Pt has scheduled PCP OV tomorrow. pt advised to discuss cough with PCP.  Pt questions answered.  Pt offered reassurance.  Understanding verbalized.

## 2017-05-16 DIAGNOSIS — R05 Cough: Secondary | ICD-10-CM | POA: Diagnosis not present

## 2017-05-16 DIAGNOSIS — Q678 Other congenital deformities of chest: Secondary | ICD-10-CM | POA: Diagnosis not present

## 2017-05-17 ENCOUNTER — Encounter (HOSPITAL_COMMUNITY)
Admission: RE | Admit: 2017-05-17 | Discharge: 2017-05-17 | Disposition: A | Discharge status: Home / Self Care | Payer: BLUE CROSS/BLUE SHIELD | Source: Ambulatory Visit | Attending: Cardiovascular Disease | Admitting: Cardiovascular Disease

## 2017-05-17 DIAGNOSIS — Z951 Presence of aortocoronary bypass graft: Secondary | ICD-10-CM | POA: Diagnosis not present

## 2017-05-17 DIAGNOSIS — Z48812 Encounter for surgical aftercare following surgery on the circulatory system: Secondary | ICD-10-CM | POA: Diagnosis not present

## 2017-05-17 NOTE — Progress Notes (Signed)
Cardiac Individual Treatment Plan  Patient Details  Name: [name] MRN: [id number] Date of Birth: [birth year] Referring Provider:     CARDIAC REHAB PHASE II ORIENTATION from 04/11/2017 in [facility] CARDI[facility]  Referring Provider  [name] MD      Initial Encounter Date:    CARDIAC REHAB PHASE II ORIENTATION from 04/11/2017 in [facility] CARDIAC REHAB  Date  04/11/17  Referring Provider  [name] MD      Visit Diagnosis: 02/21/17 S/P CABG x 3  Patient's Home Medications on Admission:  Current Outpatient Prescriptions:  .  amoxicillin (AMOXIL) 500 MG tablet, Take 2,000 mg by mouth once as needed (takes prior to dental surgeries). , Disp: , Rfl:  .  aspirin EC 81 MG tablet, Take 1 tablet (81 mg total) by mouth daily., Disp: 90 tablet, Rfl: 3 .  carvedilol (COREG) 3.125 MG tablet, Take 1 tablet (3.125 mg total) by mouth 2 (two) times daily with a meal., Disp: 180 tablet, Rfl: 1 .  citalopram (CELEXA) 10 MG tablet, Take 10 mg by mouth daily., Disp: , Rfl: 12 .  Multiple Vitamin (MULTIVITAMIN) tablet, Take 1 tablet by mouth daily., Disp: , Rfl:  .  warfarin (COUMADIN) 7.5 MG tablet, Take 1 tablet (7.5 mg total) by mouth daily at 6 PM. (Patient taking differently: Take 7.5 mg by mouth daily at 6 PM. OR AS DIRECTED), Disp: 30 tablet, Rfl: 3  Past Medical History: Past Medical History:  Diagnosis Date  . Aortic insufficiency   . Aortic valve disease    a. Biscuspid AVR with heavy annular calcification s/p tissue AVR (pt preferred). b. Mod-severe perivalvular regurg by cath 08/2013, mod by TEE 08/2013, gradients lower by TEE than by echo.   . Complication of anesthesia    when intubated  damage to vocal after surgery  2010  . Depression   . Frequent urination   . Hearing loss    bilateral, pt wears hearing aids   . Nonischemic cardiomyopathy (HCC)    a. EF normal immediately after AVR, then decreased to 40-45% by echo & 34% by MRI 2011.  b. Most recent EF 40-45% by echo/TEE 08/2013.  . Paravalvular leak of prosthetic heart valve   . Prosthetic valve dysfunction   . Reflux   . Rosacea   . 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 Shortness of breath    with exertion  . Thrombocytopenia (HCC)     Tobacco Use: History  Smoking Status  . Never Smoker  Smokeless Tobacco  . Never Used    Labs: Recent Review Flowsheet Data    Labs for ITP Cardiac and Pulmonary Rehab Latest Ref Rng & Units 02/21/2017 02/21/2017 02/21/2017 02/22/2017 02/22/2017   Hemoglobin A1c 4.8 - 5.6 % - - - - -   PHART 7.350 - 7.450 7.386 - 7.348(L) 7.341(L) -   PCO2ART 32.0 - 48.0 mmHg 41.9 - 42.1 42.8 -   HCO3 20.0 - 28.0 mmol/L 25.3 - 23.2 23.2 -   TCO2 0 - 100 mmol/L 27 25 24 25 26    ACIDBASEDEF 0.0 - 2.0  mmol/L - - 2.0 2.0 -   O2SAT % 98.0 - 94.0 96.0 -      Capillary Blood Glucose: Lab Results  Component Value Date   GLUCAP 115 (H) 02/23/2017   GLUCAP 116 (H) 02/23/2017   GLUCAP 126 (H) 02/22/2017   GLUCAP 147 (H) 02/22/2017   GLUCAP 120 (H) 02/22/2017     Exercise Target Goals:    Exercise Program Goal: Individual exercise prescription set with THRR, safety & activity barriers. Participant demonstrates ability to understand and report RPE using BORG scale, to self-measure pulse accurately, and to acknowledge the importance of the exercise prescription.  Exercise Prescription Goal: Starting with aerobic activity 30 plus minutes a day, 3 days per week for initial exercise prescription. Provide home exercise prescription and guidelines that participant acknowledges understanding prior to  discharge.  Activity Barriers & Risk Stratification:     Activity Barriers & Cardiac Risk Stratification - 04/11/17 1621      Activity Barriers & Cardiac Risk Stratification   Cardiac Risk Stratification High      6 Minute Walk:     6 Minute Walk    Row Name 04/11/17 1620         6 Minute Walk   Phase Initial     Distance 2064 feet     Walk Time 6 minutes     # of Rest Breaks 0     MPH 3.9     METS 5.16     RPE 10     VO2 Peak 18.08     Symptoms No     Resting HR 77 bpm     Resting BP 118/72     Max Ex. HR 102 bpm     Max Ex. BP 138/64     2 Minute Post BP 104/60        Oxygen Initial Assessment:   Oxygen Re-Evaluation:   Oxygen Discharge (Final Oxygen Re-Evaluation):   Initial Exercise Prescription:     Initial Exercise Prescription - 04/11/17 1600      Date of Initial Exercise RX and Referring Provider   Date 04/11/17   Referring Provider [name] MD     Treadmill   MPH 3.5   Grade 1   Minutes 10   METs 4.07     Bike   Level 1   Minutes 10   METs 3.36     NuStep   Level 3   SPM 90   Minutes 10   METs 2.5     Prescription Details   Frequency (times per week) 3   Duration Progress to 30 minutes of continuous aerobic without signs/symptoms of physical distress     Intensity   THRR 40-80% of Max Heartrate 64-128   Ratings of Perceived Exertion 11-13   Perceived Dyspnea 0-4     Progression   Progression Continue to progress workloads to maintain intensity without signs/symptoms of physical distress.     Resistance Training   Training Prescription Yes   Weight 3lbs   Reps 10-15      Perform Capillary Blood Glucose checks as needed.  Exercise Prescription Changes:     Exercise Prescription Changes    Row Name 04/19/17 1600 05/01/17 1000 05/17/17 1000         Response to Exercise   Blood Pressure (Admit) 104/64 118/70 114/76     Blood Pressure (Exercise) 144/72 128/76 138/80     Blood Pressure (Exit) 114/80 102/60  114/80     Heart  Rate (Admit) 71 bpm 71 bpm 73 bpm     Heart Rate (Exercise) 107 bpm 104 bpm 120 bpm     Heart Rate (Exit) 68 bpm 71 bpm 72 bpm     Rating of Perceived Exertion (Exercise) 13 13 13      Symptoms none none none     Comments Pt was oriented to exercise equipment on 4/45/18  -  -     Duration Continue with 30 min of aerobic exercise without signs/symptoms of physical distress. Continue with 30 min of aerobic exercise without signs/symptoms of physical distress. Continue with 30 min of aerobic exercise without signs/symptoms of physical distress.     Intensity THRR unchanged THRR unchanged THRR unchanged       Progression   Progression Continue to progress workloads to maintain intensity without signs/symptoms of physical distress. Continue to progress workloads to maintain intensity without signs/symptoms of physical distress. Continue to progress workloads to maintain intensity without signs/symptoms of physical distress.     Average METs 3.25 3.5 4.4       Resistance Training   Training Prescription Yes Yes Yes     Weight 3lbs 5lbs 5lbs     Reps 10-15 10-15 10-15     Time 10 Minutes 10 Minutes 10 Minutes       Treadmill   MPH 3.5 3.5 3.7     Grade 1 1 2      Minutes 10 10 10      METs 4.07 4.07 4.85       Bike   Level 1 1 1      Minutes 10 10 10      METs 3.36 3.35 3.35       NuStep   Level 3 5 5      SPM 90 90 100     Minutes 10 10 10      METs 2.3 3 4.9       Home Exercise Plan   Plans to continue exercise at  - Home (comment)  walking Home (comment)  walking     Frequency  - Add 3 additional days to program exercise sessions. Add 3 additional days to program exercise sessions.     Initial Home Exercises Provided  - 04/28/17 04/28/17        Exercise Comments:     Exercise Comments    Row Name 04/19/17 1642 05/10/17 1054         Exercise Comments Pt responded well to exercise session. Pt completed first session without any s/s of CP, dizziness or SOB;  will continue to monitor exercise progression and activity levels Reviewed METs and goals. Pt is tolerating exercise very well; will continue to monitor exercise progression.         Exercise Goals and Review:     Exercise Goals    Row Name 04/11/17 1406             Exercise Goals   Increase Physical Activity Yes       Intervention Provide advice, education, support and counseling about physical activity/exercise needs.;Develop an individualized exercise prescription for aerobic and resistive training based on initial evaluation findings, risk stratification, comorbidities and participant's personal goals.       Expected Outcomes Achievement of increased cardiorespiratory fitness and enhanced flexibility, muscular endurance and strength shown through measurements of functional capacity and personal statement of participant.       Increase Strength and Stamina Yes  Be able to perform exercises in confidence and without fear of having  another cardiac event.         Intervention Provide advice, education, support and counseling about physical activity/exercise needs.;Develop an individualized exercise prescription for aerobic and resistive training based on initial evaluation findings, risk stratification, comorbidities and participant's personal goals.       Expected Outcomes Achievement of increased cardiorespiratory fitness and enhanced flexibility, muscular endurance and strength shown through measurements of functional capacity and personal statement of participant.          Exercise Goals Re-Evaluation :     Exercise Goals Re-Evaluation    Row Name 04/28/17 1607 05/10/17 1055           Exercise Goal Re-Evaluation   Exercise Goals Review Increase Strenth and Stamina;Increase Physical Activity Increase Physical Activity;Increase Strenth and Stamina      Comments Reviewed home exercise with pt today.  Pt plans to walk for exercise 2-3x/week in addition to coming to cardiac rehab.  Encouraged 150 minutes of aerobic activity per week.  Reviewed THR, pulse, RPE, sign and symptoms,and when to call 911 or MD.  Also discussed weather considerations and indoor options.  Pt voiced understanding. Pt is noticed a decrease in muscle mass and tone. Pt is interested in strength training. Will send a request order to cardiologist.       Expected Outcomes Pt will be compliant with HEP and be able to exercise without diffculty Pt will be able to return to weight lifting in order to buil on muscle mass and tone          Discharge Exercise Prescription (Final Exercise Prescription Changes):     Exercise Prescription Changes - 05/17/17 1000      Response to Exercise   Blood Pressure (Admit) 114/76   Blood Pressure (Exercise) 138/80   Blood Pressure (Exit) 114/80   Heart Rate (Admit) 73 bpm   Heart Rate (Exercise) 120 bpm   Heart Rate (Exit) 72 bpm   Rating of Perceived Exertion (Exercise) 13   Symptoms none   Duration Continue with 30 min of aerobic exercise without signs/symptoms of physical distress.   Intensity THRR unchanged     Progression   Progression Continue to progress workloads to maintain intensity without signs/symptoms of physical distress.   Average METs 4.4     Resistance Training   Training Prescription Yes   Weight 5lbs   Reps 10-15   Time 10 Minutes     Treadmill   MPH 3.7   Grade 2   Minutes 10   METs 4.85     Bike   Level 1   Minutes 10   METs 3.35     NuStep   Level 5   SPM 100   Minutes 10   METs 4.9     Home Exercise Plan   Plans to continue exercise at Home (comment)  walking   Frequency Add 3 additional days to program exercise sessions.   Initial Home Exercises Provided 04/28/17      Nutrition:  Target Goals: Understanding of nutrition guidelines, daily intake of sodium 1500mg , cholesterol 200mg , calories 30% from fat and 7% or less from saturated fats, daily to have 5 or more servings of fruits and  vegetables.  Biometrics:     Pre Biometrics - 04/11/17 1625      Pre Biometrics   Waist Circumference 35.75 inches   Hip Circumference 38.75 inches   Waist to Hip Ratio 0.92 %   Triceps Skinfold 13 mm   % Body Fat 23 %  Grip Strength 38 kg   Flexibility 10 in   Single Leg Stand 30 seconds       Nutrition Therapy Plan and Nutrition Goals:     Nutrition Therapy & Goals - 05/01/17 0955      Nutrition Therapy   Diet Therapeutic Lifestyle Changes   Drug/Food Interactions Coumadin/Vit K     Personal Nutrition Goals   Nutrition Goal Decrease simple carbohydrates in the diet.      Intervention Plan   Intervention Prescribe, educate and counsel regarding individualized specific dietary modifications aiming towards targeted core components such as weight, hypertension, lipid management, diabetes, heart failure and other comorbidities.   Expected Outcomes Short Term Goal: Understand basic principles of dietary content, such as calories, fat, sodium, cholesterol and nutrients.;Long Term Goal: Adherence to prescribed nutrition plan.      Nutrition Discharge: Nutrition Scores:     Nutrition Assessments - 05/01/17 0955      MEDFICTS Scores   Pre Score 60      Nutrition Goals Re-Evaluation:   Nutrition Goals Re-Evaluation:   Nutrition Goals Discharge (Final Nutrition Goals Re-Evaluation):   Psychosocial: Target Goals: Acknowledge presence or absence of significant depression and/or stress, maximize coping skills, provide positive support system. Participant is able to verbalize types and ability to use techniques and skills needed for reducing stress and depression.  Initial Review & Psychosocial Screening:     Initial Psych Review & Screening - 04/19/17 1649      Initial Review   Current issues with Current Stress Concerns;Current Depression;History of Depression   Source of Stress Concerns Family   Comments pt recently seperated from wife.      Family Dynamics    Good Support System? Yes   Comments recent seperation from wife     Barriers   Psychosocial barriers to participate in program Psychosocial barriers identified (see note)     Screening Interventions   Interventions Encouraged to exercise;Provide feedback about the scores to participant      Quality of Life Scores:     Quality of Life - 04/28/17 1008      Quality of Life Scores   Health/Function Pre 23.1 %  pt has health related anxiety from recent cardiac event. overall pt feels positive and hopeful.  fears and concerns dismishing as pt increases funcational ability and resumes previous activities.    Socioeconomic Pre 23.21 %   Psych/Spiritual Pre 20.5 %   Family Pre 16.6 %  pt seperated from his wife. this has been difficult for him. pt has large support base and personal therapist. pt is identifiying positive coping skills.    GLOBAL Pre 21.63 %      PHQ-9: Recent Review Flowsheet Data    Depression screen [facility] 2/9 04/19/2017   Decreased Interest 0   Down, Depressed, Hopeless 1   PHQ - 2 Score 1     Interpretation of Total Score  Total Score Depression Severity:  1-4 = Minimal depression, 5-9 = Mild depression, 10-14 = Moderate depression, 15-19 = Moderately severe depression, 20-27 = Severe depression   Psychosocial Evaluation and Intervention:     Psychosocial Evaluation - 04/19/17 1650      Psychosocial Evaluation & Interventions   Interventions Stress management education;Relaxation education;Encouraged to exercise with the program and follow exercise prescription   Comments pt is currently being treated from situational stress, anxiety and depression by his PCP.  pt reports the current therapy is effective.  pt has good support in church friends.  Expected Outcomes pt will continue treatment as necessary to be able to exhibit positive outlook with good coping skills.    Continue Psychosocial Services  Follow up required by staff      Psychosocial  Re-Evaluation:     Psychosocial Re-Evaluation    Row Name 05/15/17 [phone]             Psychosocial Re-Evaluation   Current issues with Current Stress Concerns;Current Anxiety/Panic       Comments pt demonstrates health related anxiety with history of situational stress.        Expected Outcomes pt will exhibit positive outlook with good coping skills.         Interventions Stress management education;Relaxation education;Encouraged to attend Cardiac Rehabilitation for the exercise       Continue Psychosocial Services  Follow up required by staff          Psychosocial Discharge (Final Psychosocial Re-Evaluation):     Psychosocial Re-Evaluation - 05/15/17 0716      Psychosocial Re-Evaluation   Current issues with Current Stress Concerns;Current Anxiety/Panic   Comments pt demonstrates health related anxiety with history of situational stress.    Expected Outcomes pt will exhibit positive outlook with good coping skills.     Interventions Stress management education;Relaxation education;Encouraged to attend Cardiac Rehabilitation for the exercise   Continue Psychosocial Services  Follow up required by staff      Vocational Rehabilitation: Provide vocational rehab assistance to qualifying candidates.   Vocational Rehab Evaluation & Intervention:     Vocational Rehab - 04/11/17 1434      Initial Vocational Rehab Evaluation & Intervention   Assessment shows need for Vocational Rehabilitation No      Education: Education Goals: Education classes will be provided on a weekly basis, covering required topics. Participant will state understanding/return demonstration of topics presented.  Learning Barriers/Preferences:     Learning Barriers/Preferences - 04/11/17 1405      Learning Barriers/Preferences   Learning Barriers Sight;Hearing   Learning Preferences Written Material;Video;Pictoral;Group Instruction      Education Topics: Count Your Pulse:  -Group instruction  provided by verbal instruction, demonstration, patient participation and written materials to support subject.  Instructors address importance of being able to find your pulse and how to count your pulse when at home without a heart monitor.  Patients get hands on experience counting their pulse with staff help and individually.   CARDIAC REHAB PHASE II EXERCISE from 05/12/2017 in [facility] CARDIAC REHAB  Date  04/28/17  Instruction Review Code  2- meets goals/outcomes      Heart Attack, Angina, and Risk Factor Modification:  -Group instruction provided by verbal instruction, video, and written materials to support subject.  Instructors address signs and symptoms of angina and heart attacks.    Also discuss risk factors for heart disease and how to make changes to improve heart health risk factors.   Functional Fitness:  -Group instruction provided by verbal instruction, demonstration, patient participation, and written materials to support subject.  Instructors address safety measures for doing things around the house.  Discuss how to get up and down off the floor, how to pick things up properly, how to safely get out of a chair without assistance, and balance training.   CARDIAC REHAB PHASE II EXERCISE from 05/12/2017 in [facility] CARDIAC REHAB  Date  05/12/17  Instruction Review Code  2- meets goals/outcomes      Meditation and Mindfulness:  -Group instruction provided by  verbal instruction, patient participation, and written materials to support subject.  Instructor addresses importance of mindfulness and meditation practice to help reduce stress and improve awareness.  Instructor also leads participants through a meditation exercise.    Stretching for Flexibility and Mobility:  -Group instruction provided by verbal instruction, patient participation, and written materials to support subject.  Instructors lead participants through series of stretches  that are designed to increase flexibility thus improving mobility.  These stretches are additional exercise for major muscle groups that are typically performed during regular warm up and cool down.   CARDIAC REHAB PHASE II EXERCISE from 05/12/2017 in [facility] CARDIAC REHAB  Date  04/21/17  Instruction Review Code  2- meets goals/outcomes      Hands Only CPR:  -Group verbal, video, and participation provides a basic overview of AHA guidelines for community CPR. Role-play of emergencies allow participants the opportunity to practice calling for help and chest compression technique with discussion of AED use.   Hypertension: -Group verbal and written instruction that provides a basic overview of hypertension including the most recent diagnostic guidelines, risk factor reduction with self-care instructions and medication management.    Nutrition I class: Heart Healthy Eating:  -Group instruction provided by PowerPoint slides, verbal discussion, and written materials to support subject matter. The instructor gives an explanation and review of the Therapeutic Lifestyle Changes diet recommendations, which includes a discussion on lipid goals, dietary fat, sodium, fiber, plant stanol/sterol esters, sugar, and the components of a well-balanced, healthy diet.   Nutrition II class: Lifestyle Skills:  -Group instruction provided by PowerPoint slides, verbal discussion, and written materials to support subject matter. The instructor gives an explanation and review of label reading, grocery shopping for heart health, heart healthy recipe modifications, and ways to make healthier choices when eating out.   Diabetes Question & Answer:  -Group instruction provided by PowerPoint slides, verbal discussion, and written materials to support subject matter. The instructor gives an explanation and review of diabetes co-morbidities, pre- and post-prandial blood glucose goals, pre-exercise blood  glucose goals, signs, symptoms, and treatment of hypoglycemia and hyperglycemia, and foot care basics.   Diabetes Blitz:  -Group instruction provided by PowerPoint slides, verbal discussion, and written materials to support subject matter. The instructor gives an explanation and review of the physiology behind type 1 and type 2 diabetes, diabetes medications and rational behind using different medications, pre- and post-prandial blood glucose recommendations and Hemoglobin A1c goals, diabetes diet, and exercise including blood glucose guidelines for exercising safely.    Portion Distortion:  -Group instruction provided by PowerPoint slides, verbal discussion, written materials, and food models to support subject matter. The instructor gives an explanation of serving size versus portion size, changes in portions sizes over the last 20 years, and what consists of a serving from each food group.   CARDIAC REHAB PHASE II EXERCISE from 05/12/2017 in [facility] CARDIAC REHAB  Date  04/20/17  Educator  RD  Instruction Review Code  2- meets goals/outcomes      Stress Management:  -Group instruction provided by verbal instruction, video, and written materials to support subject matter.  Instructors review role of stress in heart disease and how to cope with stress positively.     CARDIAC REHAB PHASE II EXERCISE from 05/12/2017 in [facility] CARDIAC REHAB  Date  04/26/17  Instruction Review Code  2- meets goals/outcomes      Exercising on Your Own:  -Group instruction provided  by verbal instruction, power point, and written materials to support subject.  Instructors discuss benefits of exercise, components of exercise, frequency and intensity of exercise, and end points for exercise.  Also discuss use of nitroglycerin and activating EMS.  Review options of places to exercise outside of rehab.  Review guidelines for sex with heart disease.   CARDIAC REHAB PHASE II  EXERCISE from 05/12/2017 in [facility] CARDIAC REHAB  Date  05/10/17  Instruction Review Code  2- meets goals/outcomes      Cardiac Drugs I:  -Group instruction provided by verbal instruction and written materials to support subject.  Instructor reviews cardiac drug classes: antiplatelets, anticoagulants, beta blockers, and statins.  Instructor discusses reasons, side effects, and lifestyle considerations for each drug class.   Cardiac Drugs II:  -Group instruction provided by verbal instruction and written materials to support subject.  Instructor reviews cardiac drug classes: angiotensin converting enzyme inhibitors (ACE-I), angiotensin II receptor blockers (ARBs), nitrates, and calcium channel blockers.  Instructor discusses reasons, side effects, and lifestyle considerations for each drug class.   CARDIAC REHAB PHASE II EXERCISE from 05/12/2017 in [facility] CARDIAC REHAB  Date  05/03/17  Educator  Annice Pih  Instruction Review Code  2- meets goals/outcomes      Anatomy and Physiology of the Circulatory System:  Group verbal and written instruction and models provide basic cardiac anatomy and physiology, with the coronary electrical and arterial systems. Review of: AMI, Angina, Valve disease, Heart Failure, Peripheral Artery Disease, Cardiac Arrhythmia, Pacemakers, and the ICD.   Other Education:  -Group or individual verbal, written, or video instructions that support the educational goals of the cardiac rehab program.   Knowledge Questionnaire Score:     Knowledge Questionnaire Score - 04/27/17 1723      Knowledge Questionnaire Score   Pre Score 20/24      Core Components/Risk Factors/Patient Goals at Admission:     Personal Goals and Risk Factors at Admission - 04/11/17 1626      Core Components/Risk Factors/Patient Goals on Admission   Hypertension Yes   Intervention Provide education on lifestyle modifcations including regular  physical activity/exercise, weight management, moderate sodium restriction and increased consumption of fresh fruit, vegetables, and low fat dairy, alcohol moderation, and smoking cessation.;Monitor prescription use compliance.   Expected Outcomes Short Term: Continued assessment and intervention until BP is < 140/80mm HG in hypertensive participants. < 130/53mm HG in hypertensive participants with diabetes, heart failure or chronic kidney disease.;Long Term: Maintenance of blood pressure at goal levels.   Stress Yes   Intervention Offer individual and/or small group education and counseling on adjustment to heart disease, stress management and health-related lifestyle change. Teach and support self-help strategies.;Refer participants experiencing significant psychosocial distress to appropriate mental health specialists for further evaluation and treatment. When possible, include family members and significant others in education/counseling sessions.   Expected Outcomes Short Term: Participant demonstrates changes in health-related behavior, relaxation and other stress management skills, ability to obtain effective social support, and compliance with psychotropic medications if prescribed.;Long Term: Emotional wellbeing is indicated by absence of clinically significant psychosocial distress or social isolation.      Core Components/Risk Factors/Patient Goals Review:      Goals and Risk Factor Review    Row Name 05/15/17 0714 05/15/17 0715           Core Components/Risk Factors/Patient Goals Review   Personal Goals Review Hypertension;Stress  -      Review  - pt with  health related anxiety and situational stress is adapting well to cardiac rehab routine.  pt HTN well managed.        Expected Outcomes  - pt will participate in CR exercise, nutrition and lifestyle education offerings including stress management classes to reduce overall CAD risk factors.          Core Components/Risk  Factors/Patient Goals at Discharge (Final Review):      Goals and Risk Factor Review - 05/15/17 0715      Core Components/Risk Factors/Patient Goals Review   Review pt with health related anxiety and situational stress is adapting well to cardiac rehab routine.  pt HTN well managed.     Expected Outcomes pt will participate in CR exercise, nutrition and lifestyle education offerings including stress management classes to reduce overall CAD risk factors.       ITP Comments:     ITP Comments    Row Name 04/11/17 1354           ITP Comments Dr. Armanda Magic, Medical Director          Comments: Pt is making expected progress toward personal goals after completing 13 sessions. Recommend continued exercise and life style modification education including  stress management and relaxation techniques to decrease cardiac risk profile.

## 2017-05-18 ENCOUNTER — Encounter: Payer: Self-pay | Admitting: Cardiovascular Disease

## 2017-05-19 ENCOUNTER — Encounter (HOSPITAL_COMMUNITY)
Admission: RE | Admit: 2017-05-19 | Discharge: 2017-05-19 | Disposition: A | Discharge status: Home / Self Care | Payer: BLUE CROSS/BLUE SHIELD | Source: Ambulatory Visit | Attending: Cardiovascular Disease | Admitting: Cardiovascular Disease

## 2017-05-19 DIAGNOSIS — Z951 Presence of aortocoronary bypass graft: Secondary | ICD-10-CM

## 2017-05-19 DIAGNOSIS — Z48812 Encounter for surgical aftercare following surgery on the circulatory system: Secondary | ICD-10-CM | POA: Diagnosis not present

## 2017-05-23 ENCOUNTER — Ambulatory Visit (INDEPENDENT_AMBULATORY_CARE_PROVIDER_SITE_OTHER): Payer: BLUE CROSS/BLUE SHIELD | Admitting: *Deleted

## 2017-05-23 DIAGNOSIS — Z952 Presence of prosthetic heart valve: Secondary | ICD-10-CM | POA: Diagnosis not present

## 2017-05-23 DIAGNOSIS — Z954 Presence of other heart-valve replacement: Secondary | ICD-10-CM | POA: Diagnosis not present

## 2017-05-23 DIAGNOSIS — Z5181 Encounter for therapeutic drug level monitoring: Secondary | ICD-10-CM | POA: Diagnosis not present

## 2017-05-23 DIAGNOSIS — Z951 Presence of aortocoronary bypass graft: Secondary | ICD-10-CM

## 2017-05-23 LAB — POCT INR: INR: 3.1

## 2017-05-24 ENCOUNTER — Encounter (HOSPITAL_COMMUNITY)
Admission: RE | Admit: 2017-05-24 | Discharge: 2017-05-24 | Disposition: A | Discharge status: Home / Self Care | Payer: BLUE CROSS/BLUE SHIELD | Source: Ambulatory Visit | Attending: Cardiovascular Disease | Admitting: Cardiovascular Disease

## 2017-05-24 DIAGNOSIS — Z951 Presence of aortocoronary bypass graft: Secondary | ICD-10-CM

## 2017-05-24 DIAGNOSIS — Z48812 Encounter for surgical aftercare following surgery on the circulatory system: Secondary | ICD-10-CM | POA: Diagnosis not present

## 2017-05-26 ENCOUNTER — Encounter (HOSPITAL_COMMUNITY)
Admission: RE | Admit: 2017-05-26 | Discharge: 2017-05-26 | Disposition: A | Discharge status: Home / Self Care | Payer: BLUE CROSS/BLUE SHIELD | Source: Ambulatory Visit | Attending: Cardiovascular Disease | Admitting: Cardiovascular Disease

## 2017-05-26 DIAGNOSIS — Z48812 Encounter for surgical aftercare following surgery on the circulatory system: Secondary | ICD-10-CM | POA: Diagnosis not present

## 2017-05-26 DIAGNOSIS — Z951 Presence of aortocoronary bypass graft: Secondary | ICD-10-CM | POA: Diagnosis not present

## 2017-05-29 ENCOUNTER — Encounter (HOSPITAL_COMMUNITY)
Admission: RE | Admit: 2017-05-29 | Discharge: 2017-05-29 | Disposition: A | Discharge status: Home / Self Care | Payer: BLUE CROSS/BLUE SHIELD | Source: Ambulatory Visit | Attending: Cardiovascular Disease | Admitting: Cardiovascular Disease

## 2017-05-29 DIAGNOSIS — Z48812 Encounter for surgical aftercare following surgery on the circulatory system: Secondary | ICD-10-CM | POA: Diagnosis not present

## 2017-05-29 DIAGNOSIS — Z951 Presence of aortocoronary bypass graft: Secondary | ICD-10-CM | POA: Diagnosis not present

## 2017-05-29 DIAGNOSIS — F4323 Adjustment disorder with mixed anxiety and depressed mood: Secondary | ICD-10-CM | POA: Diagnosis not present

## 2017-05-31 ENCOUNTER — Encounter (HOSPITAL_COMMUNITY)
Admission: RE | Admit: 2017-05-31 | Discharge: 2017-05-31 | Disposition: A | Discharge status: Home / Self Care | Payer: BLUE CROSS/BLUE SHIELD | Source: Ambulatory Visit | Attending: Cardiovascular Disease | Admitting: Cardiovascular Disease

## 2017-05-31 DIAGNOSIS — Z951 Presence of aortocoronary bypass graft: Secondary | ICD-10-CM | POA: Diagnosis not present

## 2017-05-31 DIAGNOSIS — Z48812 Encounter for surgical aftercare following surgery on the circulatory system: Secondary | ICD-10-CM | POA: Diagnosis not present

## 2017-06-02 ENCOUNTER — Encounter (HOSPITAL_COMMUNITY)
Admission: RE | Admit: 2017-06-02 | Discharge: 2017-06-02 | Disposition: A | Discharge status: Home / Self Care | Payer: BLUE CROSS/BLUE SHIELD | Source: Ambulatory Visit | Attending: Cardiovascular Disease | Admitting: Cardiovascular Disease

## 2017-06-02 DIAGNOSIS — Z48812 Encounter for surgical aftercare following surgery on the circulatory system: Secondary | ICD-10-CM | POA: Diagnosis not present

## 2017-06-02 DIAGNOSIS — Z951 Presence of aortocoronary bypass graft: Secondary | ICD-10-CM

## 2017-06-02 NOTE — Progress Notes (Signed)
Cardiology Office Note   Date:  06/06/2017   ID:  [name], DOB 1956/05/18, MRN [id number]  PCP:  Asencion Gowda.[name], MD  Cardiologist:   [name], MD   No chief complaint on file.     History of Present Illness: [name] is a 61 y.o. male who presents for f/u of redo AVR  Sees Dr Cornelius Moras Careers adviser.   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  The patient's coronary arteries were noted to originate extremely close to the aortic annulus. 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 Still needs post op TTE Going to rehab.    Past Medical History:  Diagnosis Date  . Aortic insufficiency   . Aortic valve disease    a. Biscuspid AVR with heavy annular calcification s/p tissue AVR (pt preferred). b. Mod-severe perivalvular regurg by cath 08/2013, mod by TEE 08/2013, gradients lower by TEE than by echo.   . Complication of anesthesia    when intubated  damage to vocal after surgery  2010  . Depression   . Frequent urination   . Hearing loss    bilateral, pt wears hearing aids   . Nonischemic cardiomyopathy (HCC)    a. EF normal immediately after AVR, then decreased to 40-45% by echo & 34% by MRI 2011. b. Most recent EF 40-45% by echo/TEE 08/2013.  . Paravalvular leak of prosthetic heart valve   . Prosthetic valve dysfunction   . Reflux   . Rosacea   . S/P aortic valve replacement with bioprosthetic valve 12/08/2009   80mm [facility] Ease bovine pericardial tissue valve placed via right mini thoracotomy approach  . 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 Shortness of breath    with exertion  . Thrombocytopenia (HCC)     Past Surgical History:  Procedure Laterality Date  . AORTIC VALVE REPLACEMENT  12/08/2009   Procedure: Right miniature anterior thoracotomy for aortic valve replacement (32-mm [facility] Ease pericardial tissue valve) Surgeon: [name], MD assistant: Kerin Perna, MD  . AORTIC VALVE REPLACEMENT N/A 02/21/2017   Procedure: REDO AORTIC VALVE REPLACEMENT (AVR) implanted with [employer] Supra- Annular (Top Hat) size 21;  Surgeon: Purcell Nails, MD;  Location: [facility] OR;  Service: Open Heart Surgery;  Laterality: N/A;  . CARDIAC CATHETERIZATION N/A 01/04/2017   Procedure: Right/Left Heart Cath and Coronary Angiography;  Surgeon: Lyn Records, MD;  Location: [facility] INVASIVE CV LAB;  Service: Cardiovascular;  Laterality: N/A;  . CORONARY ARTERY BYPASS GRAFT N/A 02/21/2017   Procedure: CORONARY ARTERY BYPASS GRAFTING (CABG) x3 with left internal mammary artery and left greater saphenous vein harvested endoscopically;  Surgeon: Purcell Nails, MD;  Location: [facility] OR;  Service: Open Heart Surgery;  Laterality: N/A;  . INGUINAL HERNIA REPAIR     Left  . INGUINAL HERNIA REPAIR  1960   right side  . LEFT AND RIGHT HEART CATHETERIZATION WITH CORONARY  ANGIOGRAM N/A 09/19/2013   Procedure: LEFT AND RIGHT HEART CATHETERIZATION WITH CORONARY ANGIOGRAM;  Surgeon: Wendall Stade, MD;  Location: [facility] CATH LAB;  Service: Cardiovascular;  Laterality: N/A;  . TEE WITHOUT CARDIOVERSION N/A 09/19/2013   Procedure: TRANSESOPHAGEAL ECHOCARDIOGRAM (TEE);  Surgeon: Wendall Stade, MD;  Location: [facility] ENDOSCOPY;  Service: Cardiovascular;  Laterality: N/A;  . TEE WITHOUT CARDIOVERSION N/A 02/21/2017   Procedure: TRANSESOPHAGEAL ECHOCARDIOGRAM (TEE);  Surgeon: Purcell Nails, MD;   Location: [facility] OR;  Service: Open Heart Surgery;  Laterality: N/A;  . TOOTH EXTRACTION Left August 2014   left bottom molar     Current Outpatient Prescriptions  Medication Sig Dispense Refill  . amoxicillin (AMOXIL) 500 MG tablet Take 2,000 mg by mouth once as needed (takes prior to dental surgeries).     Marland Kitchen aspirin EC 81 MG tablet Take 1 tablet (81 mg total) by mouth daily. 90 tablet 3  . carvedilol (COREG) 3.125 MG tablet Take 1 tablet (3.125 mg total) by mouth 2 (two) times daily with a meal. 180 tablet 1  . citalopram (CELEXA) 10 MG tablet Take 10 mg by mouth daily.  12  . Multiple Vitamin (MULTIVITAMIN) tablet Take 1 tablet by mouth daily.    Marland Kitchen warfarin (COUMADIN) 7.5 MG tablet Take 1 tablet (7.5 mg total) by mouth daily at 6 PM. (Patient taking differently: Take 7.5 mg by mouth daily at 6 PM. OR AS DIRECTED) 30 tablet 3   No current facility-administered medications for this visit.     Allergies:   No known allergies    Social History:  The patient  reports that he has never smoked. He has never used smokeless tobacco. He reports that he drinks about 0.6 oz of alcohol per week . He reports that he does not use drugs.   Family History:  The patient's family history includes Hypertension in his father and mother.    ROS:  Please see the history of present illness.   Otherwise, review of systems are positive for none.   All other systems are reviewed and negative.    PHYSICAL EXAM: VS:  BP 133/86 (BP Location: Right Arm)   Pulse 72   Ht 6' (1.829 m)   Wt 81.2 kg (179 lb 1.9 oz)   BMI 24.29 kg/m  , BMI Body mass index is 24.29 kg/m. Affect appropriate Healthy:  appears stated age HEENT: normal Neck supple with no adenopathy JVP normal no bruits no thyromegaly Lungs clear with no wheezing and good diaphragmatic motion Heart:  S1/S2click SEM no AR  no murmur, no rub, gallop or click Post sternotomy x 2  PMI normal Abdomen: benighn, BS positve, no tenderness, no AAA no  bruit.  No HSM or HJR Distal pulses intact with no bruits No edema post venectomy for bypass  Neuro non-focal Skin warm and dry No muscular weakness    EKG:  SR rate 80 septal infarct lateral T wave changes 03/14/17    Recent Labs: 02/17/2017: ALT 28 02/22/2017: Magnesium 2.3 02/24/2017: BUN 17; Creatinine, Ser 0.98; Hemoglobin 9.4; Platelets 108; Potassium 3.6; Sodium 138    Lipid Panel No results found for: CHOL, TRIG, HDL, CHOLHDL, VLDL, LDLCALC, LDLDIRECT    Wt Readings from Last 3 Encounters:  06/06/17 81.2 kg (179 lb 1.9 oz)  05/08/17 79.4 kg (175 lb)  04/11/17 81.8 kg (180 lb 5.4 oz)      Other studies Reviewed: Additional studies/ records that were reviewed today include: TEE .02/21/17 Operative report Dr Cornelius Moras  ASSESSMENT AND PLAN:  1.  Redo AVR :normal valve clicks no AR SBE discussed f/u echo for EF and baseline gradients  2. 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 Anticoagulation Discussed having same amount of spinach weekly f/u clinic as indicated Notes easier bruising to be expected 4. Depression  Continue Celexa has separated from wife about a year ago Work going well "She was on a different spiritual path"    Current medicines are reviewed at length with the patient today.  The patient does not have concerns regarding medicines.  The following changes have been made:  no change  Labs/ tests ordered today include: Echo   Orders Placed This Encounter  Procedures  . ECHOCARDIOGRAM COMPLETE     Disposition:   FU with me in 6 months Dr Cornelius Moras in 2-3 weeks      Signed, [name], MD  06/06/2017 11:34 AM    [facility] [address] [place], [place], [place]  [address] Phone: [phone];  Fax: [phone]

## 2017-06-05 ENCOUNTER — Encounter (HOSPITAL_COMMUNITY)
Admission: RE | Admit: 2017-06-05 | Discharge: 2017-06-05 | Disposition: A | Discharge status: Home / Self Care | Payer: BLUE CROSS/BLUE SHIELD | Source: Ambulatory Visit | Attending: Cardiovascular Disease | Admitting: Cardiovascular Disease

## 2017-06-05 DIAGNOSIS — Z951 Presence of aortocoronary bypass graft: Secondary | ICD-10-CM

## 2017-06-05 DIAGNOSIS — Z48812 Encounter for surgical aftercare following surgery on the circulatory system: Secondary | ICD-10-CM | POA: Diagnosis not present

## 2017-06-06 ENCOUNTER — Encounter: Payer: Self-pay | Admitting: Cardiovascular Disease

## 2017-06-06 ENCOUNTER — Ambulatory Visit (INDEPENDENT_AMBULATORY_CARE_PROVIDER_SITE_OTHER): Payer: BLUE CROSS/BLUE SHIELD | Admitting: Cardiovascular Disease

## 2017-06-06 ENCOUNTER — Ambulatory Visit (INDEPENDENT_AMBULATORY_CARE_PROVIDER_SITE_OTHER): Payer: BLUE CROSS/BLUE SHIELD | Admitting: Pharmacist

## 2017-06-06 VITALS — BP 133/86 | HR 72 | Ht 72.0 in | Wt 179.1 lb

## 2017-06-06 DIAGNOSIS — Z951 Presence of aortocoronary bypass graft: Secondary | ICD-10-CM

## 2017-06-06 DIAGNOSIS — Z952 Presence of prosthetic heart valve: Secondary | ICD-10-CM

## 2017-06-06 DIAGNOSIS — Z954 Presence of other heart-valve replacement: Secondary | ICD-10-CM

## 2017-06-06 DIAGNOSIS — Z5181 Encounter for therapeutic drug level monitoring: Secondary | ICD-10-CM

## 2017-06-06 LAB — POCT INR: INR: 3.1

## 2017-06-06 NOTE — Patient Instructions (Addendum)

## 2017-06-07 ENCOUNTER — Encounter (HOSPITAL_COMMUNITY)
Admission: RE | Admit: 2017-06-07 | Discharge: 2017-06-07 | Disposition: A | Discharge status: Home / Self Care | Payer: BLUE CROSS/BLUE SHIELD | Source: Ambulatory Visit | Attending: Cardiovascular Disease | Admitting: Cardiovascular Disease

## 2017-06-07 DIAGNOSIS — Z951 Presence of aortocoronary bypass graft: Secondary | ICD-10-CM

## 2017-06-07 DIAGNOSIS — Z48812 Encounter for surgical aftercare following surgery on the circulatory system: Secondary | ICD-10-CM | POA: Diagnosis not present

## 2017-06-09 ENCOUNTER — Encounter (HOSPITAL_COMMUNITY)
Admission: RE | Admit: 2017-06-09 | Discharge: 2017-06-09 | Disposition: A | Discharge status: Home / Self Care | Payer: BLUE CROSS/BLUE SHIELD | Source: Ambulatory Visit | Attending: Cardiovascular Disease | Admitting: Cardiovascular Disease

## 2017-06-09 DIAGNOSIS — Z48812 Encounter for surgical aftercare following surgery on the circulatory system: Secondary | ICD-10-CM | POA: Diagnosis not present

## 2017-06-09 DIAGNOSIS — Z951 Presence of aortocoronary bypass graft: Secondary | ICD-10-CM | POA: Diagnosis not present

## 2017-06-12 ENCOUNTER — Encounter (HOSPITAL_COMMUNITY)
Admission: RE | Admit: 2017-06-12 | Discharge: 2017-06-12 | Disposition: A | Discharge status: Home / Self Care | Payer: BLUE CROSS/BLUE SHIELD | Source: Ambulatory Visit | Attending: Cardiovascular Disease | Admitting: Cardiovascular Disease

## 2017-06-12 DIAGNOSIS — Z951 Presence of aortocoronary bypass graft: Secondary | ICD-10-CM | POA: Diagnosis not present

## 2017-06-12 DIAGNOSIS — Z48812 Encounter for surgical aftercare following surgery on the circulatory system: Secondary | ICD-10-CM | POA: Diagnosis not present

## 2017-06-13 NOTE — Progress Notes (Signed)
Cardiac Individual Treatment Plan  Patient Details  Name: [name] MRN: [id number] Date of Birth: [birth year] Referring Provider:     CARDIAC REHAB PHASE II ORIENTATION from 04/11/2017 in [facility] CARDI[facility]  Referring Provider  [name] MD      Initial Encounter Date:    CARDIAC REHAB PHASE II ORIENTATION from 04/11/2017 in [facility] CARDIAC REHAB  Date  04/11/17  Referring Provider  [name] MD      Visit Diagnosis: 02/21/17 S/P CABG x 3  Patient's Home Medications on Admission:  Current Outpatient Prescriptions:  .  amoxicillin (AMOXIL) 500 MG tablet, Take 2,000 mg by mouth once as needed (takes prior to dental surgeries). , Disp: , Rfl:  .  aspirin EC 81 MG tablet, Take 1 tablet (81 mg total) by mouth daily., Disp: 90 tablet, Rfl: 3 .  carvedilol (COREG) 3.125 MG tablet, Take 1 tablet (3.125 mg total) by mouth 2 (two) times daily with a meal., Disp: 180 tablet, Rfl: 1 .  citalopram (CELEXA) 10 MG tablet, Take 10 mg by mouth daily., Disp: , Rfl: 12 .  Multiple Vitamin (MULTIVITAMIN) tablet, Take 1 tablet by mouth daily., Disp: , Rfl:  .  warfarin (COUMADIN) 7.5 MG tablet, Take 1 tablet (7.5 mg total) by mouth daily at 6 PM. (Patient taking differently: Take 7.5 mg by mouth daily at 6 PM. OR AS DIRECTED), Disp: 30 tablet, Rfl: 3  Past Medical History: Past Medical History:  Diagnosis Date  . Aortic insufficiency   . Aortic valve disease    a. Biscuspid AVR with heavy annular calcification s/p tissue AVR (pt preferred). b. Mod-severe perivalvular regurg by cath 08/2013, mod by TEE 08/2013, gradients lower by TEE than by echo.   . Complication of anesthesia    when intubated  damage to vocal after surgery  2010  . Depression   . Frequent urination   . Hearing loss    bilateral, pt wears hearing aids   . Nonischemic cardiomyopathy (HCC)    a. EF normal immediately after AVR, then decreased to 40-45% by echo & 34% by MRI 2011.  b. Most recent EF 40-45% by echo/TEE 08/2013.  . Paravalvular leak of prosthetic heart valve   . Prosthetic valve dysfunction   . Reflux   . Rosacea   . 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 Shortness of breath    with exertion  . Thrombocytopenia (HCC)     Tobacco Use: History  Smoking Status  . Never Smoker  Smokeless Tobacco  . Never Used    Labs: Recent Review Flowsheet Data    Labs for ITP Cardiac and Pulmonary Rehab Latest Ref Rng & Units 02/21/2017 02/21/2017 02/21/2017 02/22/2017 02/22/2017   Hemoglobin A1c 4.8 - 5.6 % - - - - -   PHART 7.350 - 7.450 7.386 - 7.348(L) 7.341(L) -   PCO2ART 32.0 - 48.0 mmHg 41.9 - 42.1 42.8 -   HCO3 20.0 - 28.0 mmol/L 25.3 - 23.2 23.2 -   TCO2 0 - 100 mmol/L 27 25 24 25 26    ACIDBASEDEF 0.0 - 2.0  mmol/L - - 2.0 2.0 -   O2SAT % 98.0 - 94.0 96.0 -      Capillary Blood Glucose: Lab Results  Component Value Date   GLUCAP 115 (H) 02/23/2017   GLUCAP 116 (H) 02/23/2017   GLUCAP 126 (H) 02/22/2017   GLUCAP 147 (H) 02/22/2017   GLUCAP 120 (H) 02/22/2017     Exercise Target Goals:    Exercise Program Goal: Individual exercise prescription set with THRR, safety & activity barriers. Participant demonstrates ability to understand and report RPE using BORG scale, to self-measure pulse accurately, and to acknowledge the importance of the exercise prescription.  Exercise Prescription Goal: Starting with aerobic activity 30 plus minutes a day, 3 days per week for initial exercise prescription. Provide home exercise prescription and guidelines that participant acknowledges understanding prior to  discharge.  Activity Barriers & Risk Stratification:     Activity Barriers & Cardiac Risk Stratification - 04/11/17 1621      Activity Barriers & Cardiac Risk Stratification   Cardiac Risk Stratification High      6 Minute Walk:     6 Minute Walk    Row Name 04/11/17 1620         6 Minute Walk   Phase Initial     Distance 2064 feet     Walk Time 6 minutes     # of Rest Breaks 0     MPH 3.9     METS 5.16     RPE 10     VO2 Peak 18.08     Symptoms No     Resting HR 77 bpm     Resting BP 118/72     Max Ex. HR 102 bpm     Max Ex. BP 138/64     2 Minute Post BP 104/60        Oxygen Initial Assessment:   Oxygen Re-Evaluation:   Oxygen Discharge (Final Oxygen Re-Evaluation):   Initial Exercise Prescription:     Initial Exercise Prescription - 04/11/17 1600      Date of Initial Exercise RX and Referring Provider   Date 04/11/17   Referring Provider [name] MD     Treadmill   MPH 3.5   Grade 1   Minutes 10   METs 4.07     Bike   Level 1   Minutes 10   METs 3.36     NuStep   Level 3   SPM 90   Minutes 10   METs 2.5     Prescription Details   Frequency (times per week) 3   Duration Progress to 30 minutes of continuous aerobic without signs/symptoms of physical distress     Intensity   THRR 40-80% of Max Heartrate 64-128   Ratings of Perceived Exertion 11-13   Perceived Dyspnea 0-4     Progression   Progression Continue to progress workloads to maintain intensity without signs/symptoms of physical distress.     Resistance Training   Training Prescription Yes   Weight 3lbs   Reps 10-15      Perform Capillary Blood Glucose checks as needed.  Exercise Prescription Changes:     Exercise Prescription Changes    Row Name 04/19/17 1600 05/01/17 1000 05/17/17 1000 05/30/17 1000       Response to Exercise   Blood Pressure (Admit) 104/64 118/70 114/76 106/62    Blood Pressure (Exercise) 144/72 128/76 138/80 124/70    Blood  Pressure (Exit) 114/80 102/60 114/80 112/80    Heart  Rate (Admit) 71 bpm 71 bpm 73 bpm 82 bpm    Heart Rate (Exercise) 107 bpm 104 bpm 120 bpm 128 bpm    Heart Rate (Exit) 68 bpm 71 bpm 72 bpm 82 bpm    Rating of Perceived Exertion (Exercise) 13 13 13 13     Symptoms none none none none    Comments Pt was oriented to exercise equipment on 4/45/18  -  -  -    Duration Continue with 30 min of aerobic exercise without signs/symptoms of physical distress. Continue with 30 min of aerobic exercise without signs/symptoms of physical distress. Continue with 30 min of aerobic exercise without signs/symptoms of physical distress. Continue with 30 min of aerobic exercise without signs/symptoms of physical distress.    Intensity THRR unchanged THRR unchanged THRR unchanged THRR unchanged      Progression   Progression Continue to progress workloads to maintain intensity without signs/symptoms of physical distress. Continue to progress workloads to maintain intensity without signs/symptoms of physical distress. Continue to progress workloads to maintain intensity without signs/symptoms of physical distress. Continue to progress workloads to maintain intensity without signs/symptoms of physical distress.    Average METs 3.25 3.5 4.4 4.6      Resistance Training   Training Prescription Yes Yes Yes Yes    Weight 3lbs 5lbs 5lbs 10lbs    Reps 10-15 10-15 10-15 10-15    Time 10 Minutes 10 Minutes 10 Minutes 10 Minutes      Treadmill   MPH 3.5 3.5 3.7 3.7    Grade 1 1 2 3     Minutes 10 10 10 10     METs 4.07 4.07 4.85 5.36      Bike   Level 1 1 1 1     Minutes 10 10 10 10     METs 3.36 3.35 3.35 3.33      NuStep   Level 3 5 5 5     SPM 90 90 100 100    Minutes 10 10 10 10     METs 2.3 3 4.9 5.2      Home Exercise Plan   Plans to continue exercise at  - Home (comment)  walking Home (comment)  walking Home (comment)  walking    Frequency  - Add 3 additional days to program exercise sessions. Add 3  additional days to program exercise sessions. Add 3 additional days to program exercise sessions.    Initial Home Exercises Provided  - 04/28/17 04/28/17 04/28/17       Exercise Comments:     Exercise Comments    Row Name 04/19/17 1642 05/10/17 1054 06/12/17 1014       Exercise Comments Pt responded well to exercise session. Pt completed first session without any s/s of CP, dizziness or SOB; will continue to monitor exercise progression and activity levels Reviewed METs and goals. Pt is tolerating exercise very well; will continue to monitor exercise progression. Reviewed METs and goals. Pt is tolerating exercise very well; will continue to monitor exercise progression.        Exercise Goals and Review:     Exercise Goals    Row Name 04/11/17 1406             Exercise Goals   Increase Physical Activity Yes       Intervention Provide advice, education, support and counseling about physical activity/exercise needs.;Develop an individualized exercise prescription for aerobic and resistive training based on initial evaluation findings, risk stratification, comorbidities and participant's personal goals.  Expected Outcomes Achievement of increased cardiorespiratory fitness and enhanced flexibility, muscular endurance and strength shown through measurements of functional capacity and personal statement of participant.       Increase Strength and Stamina Yes  Be able to perform exercises in confidence and without fear of having another cardiac event.         Intervention Provide advice, education, support and counseling about physical activity/exercise needs.;Develop an individualized exercise prescription for aerobic and resistive training based on initial evaluation findings, risk stratification, comorbidities and participant's personal goals.       Expected Outcomes Achievement of increased cardiorespiratory fitness and enhanced flexibility, muscular endurance and strength shown  through measurements of functional capacity and personal statement of participant.          Exercise Goals Re-Evaluation :     Exercise Goals Re-Evaluation    Row Name 04/28/17 1607 05/10/17 1055 06/05/17 1737 06/12/17 1014       Exercise Goal Re-Evaluation   Exercise Goals Review Increase Strenth and Stamina;Increase Physical Activity Increase Physical Activity;Increase Strenth and Stamina Increase Physical Activity;Increase Strenth and Stamina  -    Comments Reviewed home exercise with pt today.  Pt plans to walk for exercise 2-3x/week in addition to coming to cardiac rehab. Encouraged 150 minutes of aerobic activity per week.  Reviewed THR, pulse, RPE, sign and symptoms,and when to call 911 or MD.  Also discussed weather considerations and indoor options.  Pt voiced understanding. Pt is noticed a decrease in muscle mass and tone. Pt is interested in strength training. Will send a request order to cardiologist.  pt is actively participating in strength training with positive results. pt is also using weights at [facility]    -    Expected Outcomes Pt will be compliant with HEP and be able to exercise without diffculty Pt will be able to return to weight lifting in order to buil on muscle mass and tone Pt will be able to return to weight lifting in order to buil on muscle mass and tone Pt will be able to return to weight lifting in order to build on muscle mass and tone        Discharge Exercise Prescription (Final Exercise Prescription Changes):     Exercise Prescription Changes - 05/30/17 1000      Response to Exercise   Blood Pressure (Admit) 106/62   Blood Pressure (Exercise) 124/70   Blood Pressure (Exit) 112/80   Heart Rate (Admit) 82 bpm   Heart Rate (Exercise) 128 bpm   Heart Rate (Exit) 82 bpm   Rating of Perceived Exertion (Exercise) 13   Symptoms none   Duration Continue with 30 min of aerobic exercise without signs/symptoms of physical distress.   Intensity THRR unchanged      Progression   Progression Continue to progress workloads to maintain intensity without signs/symptoms of physical distress.   Average METs 4.6     Resistance Training   Training Prescription Yes   Weight 10lbs   Reps 10-15   Time 10 Minutes     Treadmill   MPH 3.7   Grade 3   Minutes 10   METs 5.36     Bike   Level 1   Minutes 10   METs 3.33     NuStep   Level 5   SPM 100   Minutes 10   METs 5.2     Home Exercise Plan   Plans to continue exercise at Home (comment)  walking   Frequency  Add 3 additional days to program exercise sessions.   Initial Home Exercises Provided 04/28/17      Nutrition:  Target Goals: Understanding of nutrition guidelines, daily intake of sodium 1500mg , cholesterol 200mg , calories 30% from fat and 7% or less from saturated fats, daily to have 5 or more servings of fruits and vegetables.  Biometrics:     Pre Biometrics - 04/11/17 1625      Pre Biometrics   Waist Circumference 35.75 inches   Hip Circumference 38.75 inches   Waist to Hip Ratio 0.92 %   Triceps Skinfold 13 mm   % Body Fat 23 %   Grip Strength 38 kg   Flexibility 10 in   Single Leg Stand 30 seconds       Nutrition Therapy Plan and Nutrition Goals:     Nutrition Therapy & Goals - 05/01/17 0955      Nutrition Therapy   Diet Therapeutic Lifestyle Changes   Drug/Food Interactions Coumadin/Vit K     Personal Nutrition Goals   Nutrition Goal Decrease simple carbohydrates in the diet.      Intervention Plan   Intervention Prescribe, educate and counsel regarding individualized specific dietary modifications aiming towards targeted core components such as weight, hypertension, lipid management, diabetes, heart failure and other comorbidities.   Expected Outcomes Short Term Goal: Understand basic principles of dietary content, such as calories, fat, sodium, cholesterol and nutrients.;Long Term Goal: Adherence to prescribed nutrition plan.      Nutrition  Discharge: Nutrition Scores:     Nutrition Assessments - 05/01/17 0955      MEDFICTS Scores   Pre Score 60      Nutrition Goals Re-Evaluation:   Nutrition Goals Re-Evaluation:   Nutrition Goals Discharge (Final Nutrition Goals Re-Evaluation):   Psychosocial: Target Goals: Acknowledge presence or absence of significant depression and/or stress, maximize coping skills, provide positive support system. Participant is able to verbalize types and ability to use techniques and skills needed for reducing stress and depression.  Initial Review & Psychosocial Screening:     Initial Psych Review & Screening - 04/19/17 1649      Initial Review   Current issues with Current Stress Concerns;Current Depression;History of Depression   Source of Stress Concerns Family   Comments pt recently seperated from wife.      Family Dynamics   Good Support System? Yes   Comments recent seperation from wife     Barriers   Psychosocial barriers to participate in program Psychosocial barriers identified (see note)     Screening Interventions   Interventions Encouraged to exercise;Provide feedback about the scores to participant      Quality of Life Scores:     Quality of Life - 04/28/17 1008      Quality of Life Scores   Health/Function Pre 23.1 %  pt has health related anxiety from recent cardiac event. overall pt feels positive and hopeful.  fears and concerns dismishing as pt increases funcational ability and resumes previous activities.    Socioeconomic Pre 23.21 %   Psych/Spiritual Pre 20.5 %   Family Pre 16.6 %  pt seperated from his wife. this has been difficult for him. pt has large support base and personal therapist. pt is identifiying positive coping skills.    GLOBAL Pre 21.63 %      PHQ-9: Recent Review Flowsheet Data    Depression screen [facility] 2/9 04/19/2017   Decreased Interest 0   Down, Depressed, Hopeless 1   PHQ - 2 Score  1     Interpretation of Total Score   Total Score Depression Severity:  1-4 = Minimal depression, 5-9 = Mild depression, 10-14 = Moderate depression, 15-19 = Moderately severe depression, 20-27 = Severe depression   Psychosocial Evaluation and Intervention:     Psychosocial Evaluation - 04/19/17 1650      Psychosocial Evaluation & Interventions   Interventions Stress management education;Relaxation education;Encouraged to exercise with the program and follow exercise prescription   Comments pt is currently being treated from situational stress, anxiety and depression by his PCP.  pt reports the current therapy is effective.  pt has good support in church friends.    Expected Outcomes pt will continue treatment as necessary to be able to exhibit positive outlook with good coping skills.    Continue Psychosocial Services  Follow up required by staff      Psychosocial Re-Evaluation:     Psychosocial Re-Evaluation    Row Name 05/15/17 0716 06/05/17 1738           Psychosocial Re-Evaluation   Current issues with Current Stress Concerns;Current Anxiety/Panic Current Stress Concerns;Current Anxiety/Panic      Comments pt demonstrates health related anxiety with history of situational stress.  pt demonstrates health related anxiety with history of situational stress.   pt health related anxiety is decreasing.       Expected Outcomes pt will exhibit positive outlook with good coping skills.   pt will exhibit positive outlook with good coping skills.        Interventions Stress management education;Relaxation education;Encouraged to attend Cardiac Rehabilitation for the exercise Stress management education;Relaxation education;Encouraged to attend Cardiac Rehabilitation for the exercise      Continue Psychosocial Services  Follow up required by staff Follow up required by staff         Psychosocial Discharge (Final Psychosocial Re-Evaluation):     Psychosocial Re-Evaluation - 06/05/17 1738      Psychosocial Re-Evaluation    Current issues with Current Stress Concerns;Current Anxiety/Panic   Comments pt demonstrates health related anxiety with history of situational stress.   pt health related anxiety is decreasing.    Expected Outcomes pt will exhibit positive outlook with good coping skills.     Interventions Stress management education;Relaxation education;Encouraged to attend Cardiac Rehabilitation for the exercise   Continue Psychosocial Services  Follow up required by staff      Vocational Rehabilitation: Provide vocational rehab assistance to qualifying candidates.   Vocational Rehab Evaluation & Intervention:     Vocational Rehab - 04/11/17 1434      Initial Vocational Rehab Evaluation & Intervention   Assessment shows need for Vocational Rehabilitation No      Education: Education Goals: Education classes will be provided on a weekly basis, covering required topics. Participant will state understanding/return demonstration of topics presented.  Learning Barriers/Preferences:     Learning Barriers/Preferences - 04/11/17 1405      Learning Barriers/Preferences   Learning Barriers Sight;Hearing   Learning Preferences Written Material;Video;Pictoral;Group Instruction      Education Topics: Count Your Pulse:  -Group instruction provided by verbal instruction, demonstration, patient participation and written materials to support subject.  Instructors address importance of being able to find your pulse and how to count your pulse when at home without a heart monitor.  Patients get hands on experience counting their pulse with staff help and individually.   CARDIAC REHAB PHASE II EXERCISE from 06/09/2017 in [facility] CARDIAC REHAB  Date  04/28/17  Instruction Review  Code  2- meets goals/outcomes      Heart Attack, Angina, and Risk Factor Modification:  -Group instruction provided by verbal instruction, video, and written materials to support subject.  Instructors address  signs and symptoms of angina and heart attacks.    Also discuss risk factors for heart disease and how to make changes to improve heart health risk factors.   Functional Fitness:  -Group instruction provided by verbal instruction, demonstration, patient participation, and written materials to support subject.  Instructors address safety measures for doing things around the house.  Discuss how to get up and down off the floor, how to pick things up properly, how to safely get out of a chair without assistance, and balance training.   CARDIAC REHAB PHASE II EXERCISE from 06/09/2017 in [facility] CARDIAC REHAB  Date  06/09/17  Instruction Review Code  2- meets goals/outcomes      Meditation and Mindfulness:  -Group instruction provided by verbal instruction, patient participation, and written materials to support subject.  Instructor addresses importance of mindfulness and meditation practice to help reduce stress and improve awareness.  Instructor also leads participants through a meditation exercise.    Stretching for Flexibility and Mobility:  -Group instruction provided by verbal instruction, patient participation, and written materials to support subject.  Instructors lead participants through series of stretches that are designed to increase flexibility thus improving mobility.  These stretches are additional exercise for major muscle groups that are typically performed during regular warm up and cool down.   CARDIAC REHAB PHASE II EXERCISE from 06/09/2017 in [facility] CARDIAC REHAB  Date  04/21/17  Instruction Review Code  2- meets goals/outcomes      Hands Only CPR:  -Group verbal, video, and participation provides a basic overview of AHA guidelines for community CPR. Role-play of emergencies allow participants the opportunity to practice calling for help and chest compression technique with discussion of AED use.   Hypertension: -Group verbal and  written instruction that provides a basic overview of hypertension including the most recent diagnostic guidelines, risk factor reduction with self-care instructions and medication management.   CARDIAC REHAB PHASE II EXERCISE from 06/09/2017 in [facility] CARDIAC REHAB  Date  06/02/17  Instruction Review Code  2- meets goals/outcomes       Nutrition I class: Heart Healthy Eating:  -Group instruction provided by PowerPoint slides, verbal discussion, and written materials to support subject matter. The instructor gives an explanation and review of the Therapeutic Lifestyle Changes diet recommendations, which includes a discussion on lipid goals, dietary fat, sodium, fiber, plant stanol/sterol esters, sugar, and the components of a well-balanced, healthy diet.   Nutrition II class: Lifestyle Skills:  -Group instruction provided by PowerPoint slides, verbal discussion, and written materials to support subject matter. The instructor gives an explanation and review of label reading, grocery shopping for heart health, heart healthy recipe modifications, and ways to make healthier choices when eating out.   Diabetes Question & Answer:  -Group instruction provided by PowerPoint slides, verbal discussion, and written materials to support subject matter. The instructor gives an explanation and review of diabetes co-morbidities, pre- and post-prandial blood glucose goals, pre-exercise blood glucose goals, signs, symptoms, and treatment of hypoglycemia and hyperglycemia, and foot care basics.   Diabetes Blitz:  -Group instruction provided by PowerPoint slides, verbal discussion, and written materials to support subject matter. The instructor gives an explanation and review of the physiology behind type 1 and type 2  diabetes, diabetes medications and rational behind using different medications, pre- and post-prandial blood glucose recommendations and Hemoglobin A1c goals, diabetes diet, and  exercise including blood glucose guidelines for exercising safely.    Portion Distortion:  -Group instruction provided by PowerPoint slides, verbal discussion, written materials, and food models to support subject matter. The instructor gives an explanation of serving size versus portion size, changes in portions sizes over the last 20 years, and what consists of a serving from each food group.   CARDIAC REHAB PHASE II EXERCISE from 06/09/2017 in [facility] CARDIAC REHAB  Date  04/20/17  Educator  RD  Instruction Review Code  2- meets goals/outcomes      Stress Management:  -Group instruction provided by verbal instruction, video, and written materials to support subject matter.  Instructors review role of stress in heart disease and how to cope with stress positively.     CARDIAC REHAB PHASE II EXERCISE from 06/09/2017 in [facility] CARDIAC REHAB  Date  04/26/17  Instruction Review Code  2- meets goals/outcomes      Exercising on Your Own:  -Group instruction provided by verbal instruction, power point, and written materials to support subject.  Instructors discuss benefits of exercise, components of exercise, frequency and intensity of exercise, and end points for exercise.  Also discuss use of nitroglycerin and activating EMS.  Review options of places to exercise outside of rehab.  Review guidelines for sex with heart disease.   CARDIAC REHAB PHASE II EXERCISE from 06/09/2017 in [facility] CARDIAC REHAB  Date  05/10/17  Instruction Review Code  2- meets goals/outcomes      Cardiac Drugs I:  -Group instruction provided by verbal instruction and written materials to support subject.  Instructor reviews cardiac drug classes: antiplatelets, anticoagulants, beta blockers, and statins.  Instructor discusses reasons, side effects, and lifestyle considerations for each drug class.   Cardiac Drugs II:  -Group instruction provided by  verbal instruction and written materials to support subject.  Instructor reviews cardiac drug classes: angiotensin converting enzyme inhibitors (ACE-I), angiotensin II receptor blockers (ARBs), nitrates, and calcium channel blockers.  Instructor discusses reasons, side effects, and lifestyle considerations for each drug class.   CARDIAC REHAB PHASE II EXERCISE from 06/09/2017 in [facility] CARDIAC REHAB  Date  05/03/17  Educator  Annice Pih  Instruction Review Code  2- meets goals/outcomes      Anatomy and Physiology of the Circulatory System:  Group verbal and written instruction and models provide basic cardiac anatomy and physiology, with the coronary electrical and arterial systems. Review of: AMI, Angina, Valve disease, Heart Failure, Peripheral Artery Disease, Cardiac Arrhythmia, Pacemakers, and the ICD.   Other Education:  -Group or individual verbal, written, or video instructions that support the educational goals of the cardiac rehab program.   Knowledge Questionnaire Score:     Knowledge Questionnaire Score - 04/27/17 1723      Knowledge Questionnaire Score   Pre Score 20/24      Core Components/Risk Factors/Patient Goals at Admission:     Personal Goals and Risk Factors at Admission - 04/11/17 1626      Core Components/Risk Factors/Patient Goals on Admission   Hypertension Yes   Intervention Provide education on lifestyle modifcations including regular physical activity/exercise, weight management, moderate sodium restriction and increased consumption of fresh fruit, vegetables, and low fat dairy, alcohol moderation, and smoking cessation.;Monitor prescription use compliance.   Expected Outcomes Short Term: Continued assessment and intervention  until BP is < 140/22mm HG in hypertensive participants. < 130/43mm HG in hypertensive participants with diabetes, heart failure or chronic kidney disease.;Long Term: Maintenance of blood pressure at goal levels.    Stress Yes   Intervention Offer individual and/or small group education and counseling on adjustment to heart disease, stress management and health-related lifestyle change. Teach and support self-help strategies.;Refer participants experiencing significant psychosocial distress to appropriate mental health specialists for further evaluation and treatment. When possible, include family members and significant others in education/counseling sessions.   Expected Outcomes Short Term: Participant demonstrates changes in health-related behavior, relaxation and other stress management skills, ability to obtain effective social support, and compliance with psychotropic medications if prescribed.;Long Term: Emotional wellbeing is indicated by absence of clinically significant psychosocial distress or social isolation.      Core Components/Risk Factors/Patient Goals Review:      Goals and Risk Factor Review    Row Name 05/15/17 0714 05/15/17 0715 06/05/17 1738         Core Components/Risk Factors/Patient Goals Review   Personal Goals Review Hypertension;Stress  - Hypertension;Stress     Review  - pt with health related anxiety and situational stress is adapting well to cardiac rehab routine.  pt HTN well managed.   pt with health related anxiety and situational stress is adapting well to cardiac rehab routine.  pt HTN well managed.       Expected Outcomes  - pt will participate in CR exercise, nutrition and lifestyle education offerings including stress management classes to reduce overall CAD risk factors.  pt will participate in CR exercise, nutrition and lifestyle education offerings including stress management classes to reduce overall CAD risk factors.         Core Components/Risk Factors/Patient Goals at Discharge (Final Review):      Goals and Risk Factor Review - 06/05/17 1738      Core Components/Risk Factors/Patient Goals Review   Personal Goals Review Hypertension;Stress   Review pt  with health related anxiety and situational stress is adapting well to cardiac rehab routine.  pt HTN well managed.     Expected Outcomes pt will participate in CR exercise, nutrition and lifestyle education offerings including stress management classes to reduce overall CAD risk factors.       ITP Comments:     ITP Comments    Row Name 04/11/17 1354 06/13/17 1606         ITP Comments Dr. Armanda Magic, Medical Director Dr. Armanda Magic, Medical Director         Comments: Pt is making expected progress toward personal goals after completing 23 sessions. Recommend continued exercise and life style modification education including  stress management and relaxation techniques to decrease cardiac risk profile.

## 2017-06-14 ENCOUNTER — Encounter (HOSPITAL_COMMUNITY): Payer: Self-pay

## 2017-06-14 ENCOUNTER — Encounter (HOSPITAL_COMMUNITY)
Admission: RE | Admit: 2017-06-14 | Discharge: 2017-06-14 | Disposition: A | Discharge status: Home / Self Care | Payer: BLUE CROSS/BLUE SHIELD | Source: Ambulatory Visit | Attending: Cardiovascular Disease | Admitting: Cardiovascular Disease

## 2017-06-14 VITALS — Ht 72.5 in | Wt 177.0 lb

## 2017-06-14 DIAGNOSIS — Z951 Presence of aortocoronary bypass graft: Secondary | ICD-10-CM | POA: Diagnosis not present

## 2017-06-14 DIAGNOSIS — Z48812 Encounter for surgical aftercare following surgery on the circulatory system: Secondary | ICD-10-CM | POA: Diagnosis not present

## 2017-06-16 ENCOUNTER — Encounter (HOSPITAL_COMMUNITY): Payer: BLUE CROSS/BLUE SHIELD

## 2017-06-19 ENCOUNTER — Encounter (HOSPITAL_COMMUNITY): Payer: BLUE CROSS/BLUE SHIELD

## 2017-06-19 ENCOUNTER — Ambulatory Visit (INDEPENDENT_AMBULATORY_CARE_PROVIDER_SITE_OTHER): Payer: BLUE CROSS/BLUE SHIELD | Admitting: *Deleted

## 2017-06-19 ENCOUNTER — Ambulatory Visit (HOSPITAL_COMMUNITY): Payer: BLUE CROSS/BLUE SHIELD | Attending: Internal Medicine

## 2017-06-19 VITALS — BP 126/81

## 2017-06-19 DIAGNOSIS — Z954 Presence of other heart-valve replacement: Secondary | ICD-10-CM

## 2017-06-19 DIAGNOSIS — Z5181 Encounter for therapeutic drug level monitoring: Secondary | ICD-10-CM | POA: Diagnosis not present

## 2017-06-19 DIAGNOSIS — Z951 Presence of aortocoronary bypass graft: Secondary | ICD-10-CM | POA: Diagnosis not present

## 2017-06-19 DIAGNOSIS — Z952 Presence of prosthetic heart valve: Secondary | ICD-10-CM

## 2017-06-19 DIAGNOSIS — I083 Combined rheumatic disorders of mitral, aortic and tricuspid valves: Secondary | ICD-10-CM | POA: Diagnosis not present

## 2017-06-19 LAB — POCT INR: INR: 3.1

## 2017-06-19 MED ORDER — PERFLUTREN LIPID MICROSPHERE
1.0000 mL | INTRAVENOUS | Status: AC | PRN
  Administered 2017-06-19: 1 mL via INTRAVENOUS

## 2017-06-20 ENCOUNTER — Telehealth: Payer: Self-pay

## 2017-06-20 DIAGNOSIS — Z954 Presence of other heart-valve replacement: Secondary | ICD-10-CM

## 2017-06-20 NOTE — Telephone Encounter (Signed)
Patient aware of echo results. Per Dr. Eden Emms, EF mildly reduced, gradients up a bit but better than old valve, f/u echo in 6 months. Patient verbalized understanding. Will put in recall for echo. Patient wanted to know what his maximum heart rate should be during exercise. Will forward to Dr. Eden Emms for advisement.   Patient stated that a detailed message could be left on his answering machine

## 2017-06-20 NOTE — Telephone Encounter (Signed)
130-140 for now for max HR with exercise

## 2017-06-20 NOTE — Telephone Encounter (Signed)
-----   Message from Wendall Stade, MD sent at 06/19/2017  9:50 PM EDT ----- EF mildly reduced gradients up a bit but better than old valve f/u echo in 6 months

## 2017-06-21 ENCOUNTER — Encounter (HOSPITAL_COMMUNITY): Payer: BLUE CROSS/BLUE SHIELD

## 2017-06-21 NOTE — Telephone Encounter (Signed)
Left detailed message on patient's answering machine. Per Dr. Eden Emms, 130- 140 for now for max HR with exercise. Encouraged patient to call with any questions.

## 2017-06-23 ENCOUNTER — Encounter (HOSPITAL_COMMUNITY): Payer: BLUE CROSS/BLUE SHIELD

## 2017-06-25 ENCOUNTER — Other Ambulatory Visit: Payer: Self-pay | Admitting: Physician Assistant

## 2017-06-26 ENCOUNTER — Other Ambulatory Visit: Payer: Self-pay | Admitting: Cardiovascular Disease

## 2017-06-26 ENCOUNTER — Encounter (HOSPITAL_COMMUNITY): Payer: BLUE CROSS/BLUE SHIELD

## 2017-06-26 DIAGNOSIS — F4323 Adjustment disorder with mixed anxiety and depressed mood: Secondary | ICD-10-CM | POA: Diagnosis not present

## 2017-06-26 MED ORDER — CARVEDILOL 3.125 MG PO TABS: 3.1250 mg | ORAL_TABLET | Freq: Two times a day (BID) | ORAL | 1 refills | Status: DC

## 2017-06-26 NOTE — Telephone Encounter (Signed)
Pt's medication was sent to pt's pharmacy as requested. Confirmation received.  °

## 2017-06-30 ENCOUNTER — Encounter (HOSPITAL_COMMUNITY): Payer: BLUE CROSS/BLUE SHIELD

## 2017-07-03 ENCOUNTER — Ambulatory Visit (INDEPENDENT_AMBULATORY_CARE_PROVIDER_SITE_OTHER): Payer: BLUE CROSS/BLUE SHIELD | Admitting: *Deleted

## 2017-07-03 ENCOUNTER — Encounter: Payer: Self-pay | Admitting: Thoracic Surgery (Cardiothoracic Vascular Surgery)

## 2017-07-03 ENCOUNTER — Encounter (HOSPITAL_COMMUNITY): Payer: BLUE CROSS/BLUE SHIELD

## 2017-07-03 ENCOUNTER — Ambulatory Visit (INDEPENDENT_AMBULATORY_CARE_PROVIDER_SITE_OTHER): Payer: BLUE CROSS/BLUE SHIELD | Admitting: Thoracic Surgery (Cardiothoracic Vascular Surgery)

## 2017-07-03 VITALS — BP 118/57 | HR 67 | Resp 20 | Ht 72.0 in | Wt 180.0 lb

## 2017-07-03 DIAGNOSIS — Z954 Presence of other heart-valve replacement: Secondary | ICD-10-CM

## 2017-07-03 DIAGNOSIS — Z5181 Encounter for therapeutic drug level monitoring: Secondary | ICD-10-CM

## 2017-07-03 DIAGNOSIS — Z952 Presence of prosthetic heart valve: Secondary | ICD-10-CM | POA: Diagnosis not present

## 2017-07-03 DIAGNOSIS — Z951 Presence of aortocoronary bypass graft: Secondary | ICD-10-CM | POA: Diagnosis not present

## 2017-07-03 LAB — POCT INR: INR: 3

## 2017-07-03 NOTE — Patient Instructions (Signed)

## 2017-07-03 NOTE — Progress Notes (Signed)
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 His subsequent postoperative recovery was remarkably uneventful and clinically he has done well.  He was last seen in our office on 05/08/2017 after he noted some swelling in between the second and third ribs on the right side. He had been lifting weights and exercising vigorously and developed some swelling in this region. Chest CT scan confirmed the absence of any significant bony abnormalities with intact repair of the second and third costal cartilages performed at the time of the patient's first surgery in 2010 and no sign of any significant sternal complications. There didn't appear to be some mild herniation of the lung through the soft tissues in between the second and third rib. The patient has also been followed closely by Dr. Eden Emms, and recent follow-up echocardiogram revealed improvement in left ventricular systolic function and normal functioning mechanical prosthetic valve in the aortic position.  Peak velocity (3.6 m/s) and estimated mean transvalvular gradient (31 mmHg) were somewhat elevated, but this was not surprising given the extremely small size of the patient's aortic root and the size of the mechanical prosthetic valve. The patient returns to  our office today for follow-up. He states that he is doing exceptionally well. He is exercising regularly and reports that he continues to notice substantial improvements in his exercise tolerance. He denies any exertional shortness of breath. He has been lifting weights and gaining strength. He complains that his INR has been a little bit unstable but he has not had any significant problems or complications related to warfarin anticoagulation. He does not have any pain or discomfort associated with the swelling on the right side of his chest and he states that this has not gotten any larger and if anything it may be getting smaller.   Current Outpatient Prescriptions  Medication Sig Dispense Refill  . amoxicillin (AMOXIL) 500 MG tablet Take 2,000 mg by mouth once as needed (takes prior to dental surgeries).     Marland Kitchen aspirin EC 81 MG tablet Take 1 tablet (81 mg total) by mouth daily. 90 tablet 3  . carvedilol (COREG) 3.125 MG tablet Take 1 tablet (3.125 mg total) by mouth 2 (two) times daily with a meal. 180 tablet 1  . citalopram (CELEXA) 10 MG tablet Take 5 mg by mouth daily.   12  . Multiple Vitamin (MULTIVITAMIN) tablet Take 1 tablet by mouth daily.    Marland Kitchen warfarin (COUMADIN) 7.5 MG tablet Take 1 tablet (7.5 mg total) by mouth daily at 6 PM. (Patient taking differently: Take 7.5 mg by mouth daily at 6 PM. OR AS DIRECTED) 30 tablet 3   No current facility-administered medications for this visit.       Physical Exam:   BP (!) 118/57  Pulse 67   Resp 20   Ht 6' (1.829 m)   Wt 180 lb (81.6 kg)   SpO2 98%   BMI 24.41 kg/m   General:  Well-appearing  Chest:   Clear to auscultation  CV:   Regular rate and rhythm with mechanical heart valve sounds, soft systolic murmur  Incisions:  Sternotomy incision is healed nicely. There is weakness in the floor of the pectoralis muscle and fascia overlying the second intercostal space but there is no bony abnormality on palpation  Abdomen:  Soft  nontender  Extremities:  Warm and well-perfused  Diagnostic Tests:  CT CHEST WITHOUT CONTRAST  TECHNIQUE: Multidetector CT imaging of the chest was performed following the standard protocol without IV contrast.  COMPARISON:  Chest CT December 30, 2016; chest radiograph May 08, 2017  FINDINGS: Cardiovascular: There is no demonstrable thoracic aortic aneurysm. Aorta is somewhat tortuous. There are foci of atherosclerotic calcification in the aorta. Visualized great vessels appear unremarkable. Patient has had previous aortic valve replacement. There is evidence of previous coronary artery bypass grafting. Foci of native coronary artery calcification noted. There is slight pericardial thickening without well-defined pericardial effusion.  Mediastinum/Nodes: Thyroid appears unremarkable. There is no appreciable thoracic adenopathy.  Lungs/Pleura: There is focal airspace consolidation in the posterior segment of the left lower lobe along its medial aspect. There is also scarring with atelectatic change and left lower lobe. There is a stable 4 mm nodular opacity in the superior segment of the right lower lobe seen on axial slice 90 series 4. There is scarring and atelectasis in the right middle lobe medially, stable. Upper lung zones are clear. No pleural effusion or pleural thickening.  Upper Abdomen: Visualized upper abdominal structures appear unremarkable.  Musculoskeletal: Patient is status post median sternotomy. Note that there is nonfusion of the sternotomy slight with wire surrounding the mildly separated bony fragments There is surgical fixation of anterior ribs on the right. No acute appearing fracture. No blastic or lytic bone lesions.  IMPRESSION: Focal area of consolidation felt to represent a degree of pneumonia in the medial aspect of the posterior segment of the left lower lobe. Areas of patchy atelectasis in both lower lung zones as well as in the  medial segment right middle lobe, stable. No pleural effusion.  Status post median sternotomy with nonfusion of the sternum with surrounding wire sutures. There is slight separation of fracture fragments in this area.  Areas of atherosclerotic calcification. Status post coronary artery bypass grafting and aortic valve replacement.  No evident adenopathy.   Electronically Signed   By: Bretta Bang III M.D.   On: 05/10/2017 14:27    Transthoracic Echocardiography  Patient:    Albert, Hersch MR #:       [id number] Study Date: 06/19/2017 Gender:     M Age:        61 Height:     184.2 cm Weight:     80.3 kg BSA:        2.03 m^2 Pt. Status: Room:   Layla Maw, M.D.  REFERRING    [name], M.D.  ATTENDING    Dietrich Pates, M.D.  SONOGRAPHER  Clearence Ped, RCS  PERFORMING   [facility], Outpatient  cc:  -------------------------------------------------------------------  ------------------------------------------------------------------- Indications:      S/p AVR (Z95.2).  ------------------------------------------------------------------- Study Conclusions  - Left ventricle: LVEF is approximately 40 to 45% with hypokinesis   of the septal, distal lateral, mild anterior and apical walls.  The cavity size was mildly dilated. Wall thickness was increased   in a pattern of mild LVH. Features are consistent with a   pseudonormal left ventricular filling pattern, with concomitant   abnormal relaxation and increased filling pressure (grade 2   diastolic dysfunction). - Aortic valve: AV prosthesis is difficult to see Peak and mean   gradients through the valve are 51 and 31 mm Hg respectively.   this is less than that reported in Fall, 2017 There was mild   regurgitation. Valve area (VTI): 0.64 cm^2. Valve area (Vmax):   0.66 cm^2. Valve area (Vmean): 0.58 cm^2. - Mitral valve: There was mild  regurgitation.  ------------------------------------------------------------------- Study data:  Comparison was made to the study of 10/17/2016.  Study status:  Routine.  Procedure:  The patient reported no pain pre or post test. Transthoracic echocardiography. Image quality was adequate.          Transthoracic echocardiography.  M-mode, complete 2D, spectral Doppler, and color Doppler.  Birthdate: Patient birthdate: 1956/01/29.  Age:  Patient is 61 yr old.  Sex: Gender: male.    BMI: 23.7 kg/m^2.  Blood pressure:     118/74 Patient status:  Outpatient.  Study date:  Study date: 06/19/2017. Study time: 03:07 PM.  Location:  [facility]  -------------------------------------------------------------------  ------------------------------------------------------------------- Left ventricle:  LVEF is approximately 40 to 45% with hypokinesis of the septal, distal lateral, mild anterior and apical walls. The cavity size was mildly dilated. Wall thickness was increased in a pattern of mild LVH. Features are consistent with a pseudonormal left ventricular filling pattern, with concomitant abnormal relaxation and increased filling pressure (grade 2 diastolic dysfunction). There was no evidence of elevated ventricular filling pressure by Doppler parameters.  ------------------------------------------------------------------- Aortic valve:  AV prosthesis is difficult to see Peak and mean gradients through the valve are 51 and 31 mm Hg respectively. this is less than that reported in Fall, 2017  Doppler:  There was mild regurgitation.    VTI ratio of LVOT to aortic valve: 0.2. Valve area (VTI): 0.64 cm^2. Indexed valve area (VTI): 0.31 cm^2/m^2. Peak velocity ratio of LVOT to aortic valve: 0.21. Valve area (Vmax): 0.66 cm^2. Indexed valve area (Vmax): 0.33 cm^2/m^2. Mean velocity ratio of LVOT to aortic valve: 0.19. Valve area (Vmean): 0.58 cm^2. Indexed valve area (Vmean): 0.29  cm^2/m^2.    Mean gradient (S): 31 mm Hg. Peak gradient (S): 51 mm Hg.  ------------------------------------------------------------------- Mitral valve:   Mildly thickened leaflets .  Doppler:  There was mild regurgitation.  ------------------------------------------------------------------- Left atrium:  The atrium was normal in size.  ------------------------------------------------------------------- Right ventricle:  The cavity size was normal. Wall thickness was normal. Systolic function was normal.  ------------------------------------------------------------------- Pulmonic valve:    Structurally normal valve.   Cusp separation was normal.  Doppler:  Transvalvular velocity was within the normal range. There was trivial regurgitation.  ------------------------------------------------------------------- Tricuspid valve:   Structurally normal valve.   Leaflet separation was normal.  Doppler:  Transvalvular velocity was within the normal range. There was mild regurgitation.  ------------------------------------------------------------------- Right atrium:  The atrium was normal in size.  ------------------------------------------------------------------- Systemic veins: Inferior vena cava: The vessel was normal in size. The respirophasic diameter changes were in the normal range (= 50%), consistent with normal central venous pressure. Diameter: 17 mm.  ------------------------------------------------------------------- Measurements   IVC  Value          Reference  ID                                        17    mm       ---------    Left ventricle                            Value          Reference  LV ID, ED, PLAX chordal           (H)     54.3  mm       43 - 52  LV ID, ES, PLAX chordal           (H)     43.3  mm       23 - 38  LV fx shortening, PLAX chordal    (L)     20    %        >=29  LV PW thickness, ED                        13.8  mm       ---------  IVS/LV PW ratio, ED                       0.52           <=1.3  Stroke volume, 2D                         50    ml       ---------  Stroke volume/bsa, 2D                     25    ml/m^2   ---------  LV e&', lateral                            7.29  cm/s     ---------  LV E/e&', lateral                          9.52           ---------  LV e&', medial                             6.09  cm/s     ---------  LV E/e&', medial                           11.4           ---------  LV e&', average                            6.69  cm/s     ---------  LV E/e&', average                          10.37          ---------    Ventricular septum  Value          Reference  IVS thickness, ED                         7.23  mm       ---------    LVOT                                      Value          Reference  LVOT ID, S                                20    mm       ---------  LVOT area                                 3.14  cm^2     ---------  LVOT peak velocity, S                     75.1  cm/s     ---------  LVOT mean velocity, S                     48.3  cm/s     ---------  LVOT VTI, S                               16    cm       ---------    Aortic valve                              Value          Reference  Aortic valve peak velocity, S             357   cm/s     ---------  Aortic valve mean velocity, S             260   cm/s     ---------  Aortic valve VTI, S                       79.1  cm       ---------  Aortic mean gradient, S                   31    mm Hg    ---------  Aortic peak gradient, S                   51    mm Hg    ---------  VTI ratio, LVOT/AV                        0.2            ---------  Aortic valve area, VTI                    0.64  cm^2     ---------  Aortic valve area/bsa, VTI                0.31  cm^2/m^2 ---------  Velocity ratio, peak, LVOT/AV             0.21           ---------  Aortic valve area, peak velocity           0.66  cm^2     ---------  Aortic valve area/bsa, peak               0.33  cm^2/m^2 ---------  velocity  Velocity ratio, mean, LVOT/AV             0.19           ---------  Aortic valve area, mean velocity          0.58  cm^2     ---------  Aortic valve area/bsa, mean               0.29  cm^2/m^2 ---------  velocity    Aorta                                     Value          Reference  Aortic root ID, ED                        34    mm       ---------    Left atrium                               Value          Reference  LA ID, A-P, ES                            34    mm       ---------  LA ID/bsa, A-P                            1.68  cm/m^2   <=2.2  LA volume, S                              71.4  ml       ---------  LA volume/bsa, S                          35.2  ml/m^2   ---------  LA volume, ES, 1-p A4C                    63.1  ml       ---------  LA volume/bsa, ES, 1-p A4C                31.1  ml/m^2   ---------  LA volume, ES, 1-p A2C                    78.1  ml       ---------  LA volume/bsa, ES, 1-p A2C                38.5  ml/m^2   ---------    Mitral valve  Value          Reference  Mitral E-wave peak velocity               69.4  cm/s     ---------  Mitral A-wave peak velocity               47.5  cm/s     ---------  Mitral deceleration time                  215   ms       150 - 230  Mitral E/A ratio, peak                    1.5            ---------    Pulmonary arteries                        Value          Reference  PA pressure, S, DP                        26    mm Hg    <=30    Tricuspid valve                           Value          Reference  Tricuspid regurg peak velocity            242   cm/s     ---------  Tricuspid peak RV-RA gradient             23    mm Hg    ---------  Tricuspid maximal regurg                  242   cm/s     ---------  velocity, PISA    Systemic veins                            Value          Reference  Estimated  CVP                             3     mm Hg    ---------    Right ventricle                           Value          Reference  TAPSE                                     19.4  mm       ---------  RV pressure, S, DP                        26    mm Hg    <=30  RV s&', lateral, S                         11.4  cm/s     ---------  Legend: (L)  and  (H)  mark  values outside specified reference range.  ------------------------------------------------------------------- Prepared and Electronically Authenticated by  Dietrich Pates, M.D. 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 He does have some weakness in the soft tissues between the second and third costal cartilages on the right side with associated slight herniation of the lung. This is related to the surgical incision performed in 2010.  In the absence of any associated pain or significant enlargement of the degree of herniation I would not recommend any type of surgical intervention.   Plan:  We have not recommended any changes to the patient's current medications. I've discussed the patient's recent echocardiogram as well as soft tissue bulging in the right anterior chest wall at length. All of his questions have been addressed. The patient will return to our office for routine follow-up next February, approximately 1 year following his surgery. He will also continue to follow-up with Dr. Eden Emms and otherwise call to return to see [place] should significant problems or questions arise.  I spent in excess of 15 minutes during the conduct of this office consultation and >50% of this time involved direct face-to-face encounter with the patient for counseling and/or coordination of their care.    [name], MD 07/03/2017 5:06 PM

## 2017-07-04 ENCOUNTER — Other Ambulatory Visit: Payer: Self-pay | Admitting: *Deleted

## 2017-07-04 MED ORDER — WARFARIN SODIUM 7.5 MG PO TABS: ORAL_TABLET | ORAL | 3 refills | Status: DC

## 2017-07-05 ENCOUNTER — Encounter (HOSPITAL_COMMUNITY): Payer: BLUE CROSS/BLUE SHIELD

## 2017-07-05 ENCOUNTER — Telehealth: Payer: Self-pay | Admitting: *Deleted

## 2017-07-05 NOTE — Telephone Encounter (Signed)
Patient left a msg on the refill vm stating that [facility] never received the rx for warfarin and he will be going out of town tomorrow.

## 2017-07-05 NOTE — Progress Notes (Signed)
Discharge Summary  Patient Details  Name: [name] MRN: [id number] Date of Birth: 04-24-1956 Referring Provider:     CARDIAC REHAB PHASE II ORIENTATION from 04/11/2017 in [facility]  Referring Provider  [name] MD       Number of Visits: 25   Reason for Discharge:  Patient reached a stable level of exercise.  Pt feels comfortable exercising on his own.  Pt has outside obligations planned that prevent CR participation. Pt requests to exit at this time and proceed with exercising on his own.   Smoking History:  History  Smoking Status  . Never Smoker  Smokeless Tobacco  . Never Used    Diagnosis:  02/21/17 S/P CABG x 3  ADL UCSD:   Initial Exercise Prescription:     Initial Exercise Prescription - 04/11/17 1600      Date of Initial Exercise RX and Referring Provider   Date 04/11/17   Referring Provider [name] MD     Treadmill   MPH 3.5   Grade 1   Minutes 10   METs 4.07     Bike   Level 1   Minutes 10   METs 3.36     NuStep   Level 3   SPM 90   Minutes 10   METs 2.5     Prescription Details   Frequency (times per week) 3   Duration Progress to 30 minutes of continuous aerobic without signs/symptoms of physical distress     Intensity   THRR 40-80% of Max Heartrate 64-128   Ratings of Perceived Exertion 11-13   Perceived Dyspnea 0-4     Progression   Progression Continue to progress workloads to maintain intensity without signs/symptoms of physical distress.     Resistance Training   Training Prescription Yes   Weight 3lbs   Reps 10-15      Discharge Exercise Prescription (Final Exercise Prescription Changes):     Exercise Prescription Changes - 06/15/17 0700      Response to Exercise   Blood Pressure (Admit) 110/72   Blood Pressure (Exercise) 122/78   Blood Pressure (Exit) 100/70   Heart Rate (Admit) 80 bpm   Heart Rate (Exercise) 135 bpm   Heart Rate (Exit) 70 bpm   Rating of Perceived  Exertion (Exercise) 121   Symptoms none   Duration Continue with 30 min of aerobic exercise without signs/symptoms of physical distress.   Intensity THRR unchanged     Progression   Progression Continue to progress workloads to maintain intensity without signs/symptoms of physical distress.   Average METs 5     Resistance Training   Training Prescription Yes   Weight 10lbs   Reps 10-15   Time 10 Minutes     Treadmill   MPH 3.7   Grade 4   Minutes 10   METs 5.87     Bike   Level 1   Minutes 10   METs 3.33     NuStep   Level 6   SPM 100   Minutes 10   METs 5.9     Home Exercise Plan   Plans to continue exercise at Home (comment)  walking   Frequency Add 3 additional days to program exercise sessions.   Initial Home Exercises Provided 04/28/17      Functional Capacity:     6 Minute Walk    Row Name 04/11/17 1620 06/15/17 0745       6 Minute Walk  Phase Initial Discharge    Distance 2064 feet 2181 feet    Distance % Change  - 5.67 %    Walk Time 6 minutes 6 minutes    # of Rest Breaks 0 0    MPH 3.9 4.13    METS 5.16 5.76    RPE 10 12    VO2 Peak 18.08 20.14    Symptoms No No    Resting HR 77 bpm 82 bpm    Resting BP 118/72 104/68    Max Ex. HR 102 bpm 131 bpm    Max Ex. BP 138/64 144/80    2 Minute Post BP 104/60 122/80       Psychological, QOL, Others - Outcomes: PHQ 2/9: Depression screen [facility] 2/9 06/14/2017 04/19/2017  Decreased Interest 0 0  Down, Depressed, Hopeless 0 1  PHQ - 2 Score 0 1    Quality of Life:     Quality of Life - 06/15/17 0752      Quality of Life Scores   Health/Function Pre 23.1 %   Health/Function Post 26.53 %   Health/Function % Change 14.85 %   Socioeconomic Pre 23.21 %   Socioeconomic Post 23.79 %   Socioeconomic % Change  2.5 %   Psych/Spiritual Pre 20.5 %   Psych/Spiritual Post 23.36 %   Psych/Spiritual % Change 13.95 %   Family Pre 16.6 %   Family Post 16.7 %   Family % Change 0.6 %   GLOBAL Pre  21.63 %   GLOBAL Post 23.87 %   GLOBAL % Change 10.36 %      Personal Goals: Goals established at orientation with interventions provided to work toward goal.     Personal Goals and Risk Factors at Admission - 04/11/17 1626      Core Components/Risk Factors/Patient Goals on Admission   Hypertension Yes   Intervention Provide education on lifestyle modifcations including regular physical activity/exercise, weight management, moderate sodium restriction and increased consumption of fresh fruit, vegetables, and low fat dairy, alcohol moderation, and smoking cessation.;Monitor prescription use compliance.   Expected Outcomes Short Term: Continued assessment and intervention until BP is < 140/20mm HG in hypertensive participants. < 130/25mm HG in hypertensive participants with diabetes, heart failure or chronic kidney disease.;Long Term: Maintenance of blood pressure at goal levels.   Stress Yes   Intervention Offer individual and/or small group education and counseling on adjustment to heart disease, stress management and health-related lifestyle change. Teach and support self-help strategies.;Refer participants experiencing significant psychosocial distress to appropriate mental health specialists for further evaluation and treatment. When possible, include family members and significant others in education/counseling sessions.   Expected Outcomes Short Term: Participant demonstrates changes in health-related behavior, relaxation and other stress management skills, ability to obtain effective social support, and compliance with psychotropic medications if prescribed.;Long Term: Emotional wellbeing is indicated by absence of clinically significant psychosocial distress or social isolation.       Personal Goals Discharge:     Goals and Risk Factor Review    Row Name 05/15/17 0488 05/15/17 0715 06/05/17 1738 06/14/17 1533       Core Components/Risk Factors/Patient Goals Review   Personal  Goals Review Hypertension;Stress  - Hypertension;Stress Hypertension;Stress    Review  - pt with health related anxiety and situational stress is adapting well to cardiac rehab routine.  pt HTN well managed.   pt with health related anxiety and situational stress is adapting well to cardiac rehab routine.  pt HTN well managed.  pt situational stress and anxiety are resolving. pt reports he feels comfortable and confident with his current state of health.  he feels confident in his ability to continue exercising on his own at [facility],     Expected Outcomes  - pt will participate in CR exercise, nutrition and lifestyle education offerings including stress management classes to reduce overall CAD risk factors.  pt will participate in CR exercise, nutrition and lifestyle education offerings including stress management classes to reduce overall CAD risk factors.  pt will continue exercise, nutrition and lifestyle modifications learned at cardiac rehab.         Nutrition & Weight - Outcomes:     Pre Biometrics - 04/11/17 1625      Pre Biometrics   Waist Circumference 35.75 inches   Hip Circumference 38.75 inches   Waist to Hip Ratio 0.92 %   Triceps Skinfold 13 mm   % Body Fat 23 %   Grip Strength 38 kg   Flexibility 10 in   Single Leg Stand 30 seconds         Post Biometrics - 06/15/17 0750       Post  Biometrics   Height 6' 0.5" (1.842 m)   Weight 177 lb 0.5 oz (80.3 kg)   Waist Circumference 35.5 inches   Hip Circumference 38.5 inches   Waist to Hip Ratio 0.92 %   BMI (Calculated) 23.7   Triceps Skinfold 12 mm   % Body Fat 22.4 %   Grip Strength 43.5 kg   Flexibility 12 in   Single Leg Stand 30 seconds      Nutrition:     Nutrition Therapy & Goals - 05/01/17 0955      Nutrition Therapy   Diet Therapeutic Lifestyle Changes   Drug/Food Interactions Coumadin/Vit K     Personal Nutrition Goals   Nutrition Goal Decrease simple carbohydrates in the diet.      Intervention  Plan   Intervention Prescribe, educate and counsel regarding individualized specific dietary modifications aiming towards targeted core components such as weight, hypertension, lipid management, diabetes, heart failure and other comorbidities.   Expected Outcomes Short Term Goal: Understand basic principles of dietary content, such as calories, fat, sodium, cholesterol and nutrients.;Long Term Goal: Adherence to prescribed nutrition plan.      Nutrition Discharge:     Nutrition Assessments - 06/30/17 1026      MEDFICTS Scores   Pre Score 60   Post Score 30   Score Difference -30      Education Questionnaire Score:     Knowledge Questionnaire Score - 06/15/17 0752      Knowledge Questionnaire Score   Post Score 22/24      Goals reviewed with patient; copy given to patient.

## 2017-07-06 MED ORDER — WARFARIN SODIUM 7.5 MG PO TABS: ORAL_TABLET | ORAL | 3 refills | Status: DC

## 2017-07-06 NOTE — Telephone Encounter (Signed)
RX resent to [facility]. Receipt confirmed.

## 2017-07-07 ENCOUNTER — Encounter (HOSPITAL_COMMUNITY): Payer: BLUE CROSS/BLUE SHIELD

## 2017-07-10 ENCOUNTER — Encounter (HOSPITAL_COMMUNITY): Payer: BLUE CROSS/BLUE SHIELD

## 2017-07-11 ENCOUNTER — Telehealth (HOSPITAL_COMMUNITY): Payer: Self-pay

## 2017-07-11 DIAGNOSIS — J029 Acute pharyngitis, unspecified: Secondary | ICD-10-CM | POA: Diagnosis not present

## 2017-07-11 NOTE — Telephone Encounter (Signed)
-----   Message from Wendall Stade, MD sent at 05/10/2017 11:10 AM EDT ----- Regarding: RE: strength training request Ok for strength training  ----- Message ----- From: Warrick Parisian D Sent: 05/10/2017  10:56 AM To: Wendall Stade, MD Subject: strength training request                      Patient has been in cardiac rehab approximately 3 weeks  and doing is well. Pt has been averaging 3.5METS and BP/HR has been in stabled with exercise. Pt is interested in doing strength training. If you feel this patient is appropriate to begin strength training please f/u with cardiac rehab staff.   Megon Kalina [employer]

## 2017-07-12 ENCOUNTER — Encounter (HOSPITAL_COMMUNITY): Payer: BLUE CROSS/BLUE SHIELD

## 2017-07-14 ENCOUNTER — Encounter (HOSPITAL_COMMUNITY): Payer: BLUE CROSS/BLUE SHIELD

## 2017-07-17 ENCOUNTER — Encounter (HOSPITAL_COMMUNITY): Payer: BLUE CROSS/BLUE SHIELD

## 2017-07-19 ENCOUNTER — Encounter (HOSPITAL_COMMUNITY): Payer: BLUE CROSS/BLUE SHIELD

## 2017-07-19 DIAGNOSIS — L821 Other seborrheic keratosis: Secondary | ICD-10-CM | POA: Diagnosis not present

## 2017-07-19 DIAGNOSIS — L910 Hypertrophic scar: Secondary | ICD-10-CM | POA: Diagnosis not present

## 2017-07-19 DIAGNOSIS — L570 Actinic keratosis: Secondary | ICD-10-CM | POA: Diagnosis not present

## 2017-07-19 DIAGNOSIS — D229 Melanocytic nevi, unspecified: Secondary | ICD-10-CM | POA: Diagnosis not present

## 2017-07-21 ENCOUNTER — Encounter (HOSPITAL_COMMUNITY): Payer: BLUE CROSS/BLUE SHIELD

## 2017-07-24 ENCOUNTER — Encounter (HOSPITAL_COMMUNITY): Payer: BLUE CROSS/BLUE SHIELD

## 2017-07-24 ENCOUNTER — Ambulatory Visit (INDEPENDENT_AMBULATORY_CARE_PROVIDER_SITE_OTHER): Payer: BLUE CROSS/BLUE SHIELD

## 2017-07-24 DIAGNOSIS — Z952 Presence of prosthetic heart valve: Secondary | ICD-10-CM | POA: Diagnosis not present

## 2017-07-24 DIAGNOSIS — Z954 Presence of other heart-valve replacement: Secondary | ICD-10-CM | POA: Diagnosis not present

## 2017-07-24 DIAGNOSIS — Z951 Presence of aortocoronary bypass graft: Secondary | ICD-10-CM | POA: Diagnosis not present

## 2017-07-24 DIAGNOSIS — Z5181 Encounter for therapeutic drug level monitoring: Secondary | ICD-10-CM

## 2017-07-24 LAB — POCT INR: INR: 3.3

## 2017-07-25 DIAGNOSIS — F4323 Adjustment disorder with mixed anxiety and depressed mood: Secondary | ICD-10-CM | POA: Diagnosis not present

## 2017-07-26 ENCOUNTER — Encounter (HOSPITAL_COMMUNITY): Payer: BLUE CROSS/BLUE SHIELD

## 2017-07-28 ENCOUNTER — Encounter (HOSPITAL_COMMUNITY): Payer: BLUE CROSS/BLUE SHIELD

## 2017-07-31 ENCOUNTER — Encounter (HOSPITAL_COMMUNITY): Payer: BLUE CROSS/BLUE SHIELD

## 2017-08-02 ENCOUNTER — Encounter (HOSPITAL_COMMUNITY): Payer: BLUE CROSS/BLUE SHIELD

## 2017-08-04 ENCOUNTER — Encounter (HOSPITAL_COMMUNITY): Payer: BLUE CROSS/BLUE SHIELD

## 2017-08-07 ENCOUNTER — Ambulatory Visit (INDEPENDENT_AMBULATORY_CARE_PROVIDER_SITE_OTHER): Payer: BLUE CROSS/BLUE SHIELD | Admitting: *Deleted

## 2017-08-07 ENCOUNTER — Encounter (HOSPITAL_COMMUNITY): Payer: BLUE CROSS/BLUE SHIELD

## 2017-08-07 DIAGNOSIS — Z5181 Encounter for therapeutic drug level monitoring: Secondary | ICD-10-CM

## 2017-08-07 DIAGNOSIS — Z951 Presence of aortocoronary bypass graft: Secondary | ICD-10-CM

## 2017-08-07 DIAGNOSIS — Z952 Presence of prosthetic heart valve: Secondary | ICD-10-CM

## 2017-08-07 DIAGNOSIS — Z954 Presence of other heart-valve replacement: Secondary | ICD-10-CM

## 2017-08-07 LAB — POCT INR: INR: 2.7

## 2017-08-09 ENCOUNTER — Encounter (HOSPITAL_COMMUNITY): Payer: BLUE CROSS/BLUE SHIELD

## 2017-08-11 ENCOUNTER — Encounter (HOSPITAL_COMMUNITY): Payer: BLUE CROSS/BLUE SHIELD

## 2017-08-11 DIAGNOSIS — R413 Other amnesia: Secondary | ICD-10-CM | POA: Diagnosis not present

## 2017-08-11 DIAGNOSIS — F4321 Adjustment disorder with depressed mood: Secondary | ICD-10-CM | POA: Diagnosis not present

## 2017-08-11 DIAGNOSIS — R419 Unspecified symptoms and signs involving cognitive functions and awareness: Secondary | ICD-10-CM | POA: Diagnosis not present

## 2017-08-14 ENCOUNTER — Encounter (HOSPITAL_COMMUNITY): Payer: BLUE CROSS/BLUE SHIELD

## 2017-08-14 DIAGNOSIS — S30871A Other superficial bite of abdominal wall, initial encounter: Secondary | ICD-10-CM | POA: Diagnosis not present

## 2017-08-14 DIAGNOSIS — R0781 Pleurodynia: Secondary | ICD-10-CM | POA: Diagnosis not present

## 2017-08-22 DIAGNOSIS — F4323 Adjustment disorder with mixed anxiety and depressed mood: Secondary | ICD-10-CM | POA: Diagnosis not present

## 2017-08-24 ENCOUNTER — Ambulatory Visit (INDEPENDENT_AMBULATORY_CARE_PROVIDER_SITE_OTHER): Payer: BLUE CROSS/BLUE SHIELD | Admitting: Pharmacist

## 2017-08-24 DIAGNOSIS — Z954 Presence of other heart-valve replacement: Secondary | ICD-10-CM | POA: Diagnosis not present

## 2017-08-24 DIAGNOSIS — Z951 Presence of aortocoronary bypass graft: Secondary | ICD-10-CM | POA: Diagnosis not present

## 2017-08-24 DIAGNOSIS — Z5181 Encounter for therapeutic drug level monitoring: Secondary | ICD-10-CM

## 2017-08-24 DIAGNOSIS — Z952 Presence of prosthetic heart valve: Secondary | ICD-10-CM | POA: Diagnosis not present

## 2017-08-24 LAB — POCT INR: INR: 3

## 2017-08-29 ENCOUNTER — Other Ambulatory Visit: Payer: Self-pay | Admitting: Family Medicine

## 2017-08-29 DIAGNOSIS — L820 Inflamed seborrheic keratosis: Secondary | ICD-10-CM | POA: Diagnosis not present

## 2017-08-29 DIAGNOSIS — Z0000 Encounter for general adult medical examination without abnormal findings: Secondary | ICD-10-CM | POA: Diagnosis not present

## 2017-08-29 DIAGNOSIS — Z23 Encounter for immunization: Secondary | ICD-10-CM | POA: Diagnosis not present

## 2017-08-29 DIAGNOSIS — I2510 Atherosclerotic heart disease of native coronary artery without angina pectoris: Secondary | ICD-10-CM | POA: Diagnosis not present

## 2017-08-29 DIAGNOSIS — Z1211 Encounter for screening for malignant neoplasm of colon: Secondary | ICD-10-CM | POA: Diagnosis not present

## 2017-08-29 DIAGNOSIS — Z125 Encounter for screening for malignant neoplasm of prostate: Secondary | ICD-10-CM | POA: Diagnosis not present

## 2017-09-14 ENCOUNTER — Ambulatory Visit (INDEPENDENT_AMBULATORY_CARE_PROVIDER_SITE_OTHER): Payer: BLUE CROSS/BLUE SHIELD | Admitting: *Deleted

## 2017-09-14 DIAGNOSIS — Z954 Presence of other heart-valve replacement: Secondary | ICD-10-CM

## 2017-09-14 DIAGNOSIS — Z5181 Encounter for therapeutic drug level monitoring: Secondary | ICD-10-CM | POA: Diagnosis not present

## 2017-09-14 DIAGNOSIS — Z952 Presence of prosthetic heart valve: Secondary | ICD-10-CM | POA: Diagnosis not present

## 2017-09-14 DIAGNOSIS — T8209XD Other mechanical complication of heart valve prosthesis, subsequent encounter: Secondary | ICD-10-CM

## 2017-09-14 DIAGNOSIS — Z951 Presence of aortocoronary bypass graft: Secondary | ICD-10-CM | POA: Diagnosis not present

## 2017-09-14 LAB — POCT INR: INR: 2.2

## 2017-10-12 ENCOUNTER — Ambulatory Visit (INDEPENDENT_AMBULATORY_CARE_PROVIDER_SITE_OTHER): Payer: BLUE CROSS/BLUE SHIELD | Admitting: *Deleted

## 2017-10-12 DIAGNOSIS — Z5181 Encounter for therapeutic drug level monitoring: Secondary | ICD-10-CM | POA: Diagnosis not present

## 2017-10-12 DIAGNOSIS — Z954 Presence of other heart-valve replacement: Secondary | ICD-10-CM

## 2017-10-12 DIAGNOSIS — Z952 Presence of prosthetic heart valve: Secondary | ICD-10-CM

## 2017-10-12 DIAGNOSIS — Z951 Presence of aortocoronary bypass graft: Secondary | ICD-10-CM

## 2017-10-12 LAB — POCT INR: INR: 1.9

## 2017-10-17 DIAGNOSIS — F4323 Adjustment disorder with mixed anxiety and depressed mood: Secondary | ICD-10-CM | POA: Diagnosis not present

## 2017-10-23 ENCOUNTER — Other Ambulatory Visit: Payer: Self-pay | Admitting: Cardiovascular Disease

## 2017-10-25 ENCOUNTER — Other Ambulatory Visit: Payer: Self-pay | Admitting: Cardiovascular Disease

## 2017-10-30 ENCOUNTER — Ambulatory Visit (HOSPITAL_COMMUNITY): Payer: BLUE CROSS/BLUE SHIELD | Attending: Cardiology

## 2017-10-30 ENCOUNTER — Other Ambulatory Visit: Payer: Self-pay

## 2017-10-30 DIAGNOSIS — R0600 Dyspnea, unspecified: Secondary | ICD-10-CM | POA: Diagnosis not present

## 2017-10-30 DIAGNOSIS — Z954 Presence of other heart-valve replacement: Secondary | ICD-10-CM | POA: Insufficient documentation

## 2017-10-30 DIAGNOSIS — I429 Cardiomyopathy, unspecified: Secondary | ICD-10-CM | POA: Insufficient documentation

## 2017-10-30 DIAGNOSIS — I359 Nonrheumatic aortic valve disorder, unspecified: Secondary | ICD-10-CM | POA: Diagnosis not present

## 2017-10-30 DIAGNOSIS — Z48812 Encounter for surgical aftercare following surgery on the circulatory system: Secondary | ICD-10-CM | POA: Diagnosis not present

## 2017-10-30 DIAGNOSIS — I083 Combined rheumatic disorders of mitral, aortic and tricuspid valves: Secondary | ICD-10-CM | POA: Insufficient documentation

## 2017-10-30 NOTE — Progress Notes (Signed)
Cardiology Office Note   Date:  11/02/2017   ID:  Daniel LameDavid R Sann, DOB 11/23/1956, MRN [id number]  PCP:  Asencion GowdaMitchell, L.August Saucerean, MD  Cardiologist:   Charlton HawsPeter Nishan, MD   No chief complaint on file.     History of Present Illness: [name] is a 61 y.o. male who presents for f/u of redo AVR  Sees Dr Cornelius Moraswen [facility] advisersurgeon.   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  The patient's coronary arteries were noted to originate extremely close to the aortic annulus. 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  Post op echo reviewed   EF 40-45% Mild LVE/LVH Grade 2 diastolic AVR mean gradient 31 mmHg peak 51 mmHg Mild MR  Echo 10/30/17  Mean gradient 30 mmHg peak 51 mmHg  Study Conclusions  - Left ventricle: The cavity size was mildly dilated. Systolic   function was moderately reduced. The estimated ejection fraction   was in the range of 35% to 40%. There is akinesis of the   entireanteroseptal, inferoseptal, and apical myocardium. There is   akinesis of the apicalanterior, lateral, and inferolateral   myocardium. - Aortic valve: Gradients still high post re do AVR DVI 18 abnormal   especially given decreased EF compared to last echo. There was   trivial regurgitation. There was mild perivalvular regurgitation.   Mean velocity (S): 259 cm/s. Peak gradient (S): 51 mm Hg. Valve   area (VTI): 0.73 cm^2. Valve area (Vmax): 0.78 cm^2. Valve area   (Vmean): 0.66 cm^2. - Mitral valve: Mild diffuse calcification of the anterior leaflet.   There was mild regurgitation. - Left  atrium: The atrium was mildly dilated. - Right ventricle: The cavity size was mildly dilated. Wall   thickness was normal. - Tricuspid valve: There was mild regurgitation. - Pulmonary arteries: PA peak pressure: 31 mm Hg (S).  He feels fine Exercising a lot Still going through divorce Coumadin a bit hard to regulare Discussed high gradients and low EF with him He declines cath or TEE for now  Past Medical History:  Diagnosis Date  . Aortic insufficiency   . Aortic valve disease    a. Biscuspid AVR with heavy annular calcification s/p tissue AVR (pt preferred). b. Mod-severe perivalvular regurg by cath 08/2013, mod by TEE 08/2013, gradients lower by TEE than by echo.   . Complication of anesthesia    when intubated  damage to vocal after surgery  2010  . Depression   . Frequent urination   . Hearing loss    bilateral, pt wears hearing aids   . Nonischemic cardiomyopathy (HCC)    a. EF normal immediately after AVR, then decreased to 40-45% by echo & 34% by MRI 2011. b. Most recent EF 40-45% by echo/TEE 08/2013.  . Paravalvular leak of prosthetic heart valve   . Prosthetic valve dysfunction   . Reflux   . Rosacea   . S/P aortic valve replacement with bioprosthetic valve 12/08/2009   21mm [facility] E[facility] bovine pericardial tissue valve placed via right mini thoracotomy approach  . 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 Shortness of breath    with exertion  . Thrombocytopenia (HCC)     Past Surgical History:  Procedure Laterality Date  . AORTIC VALVE REPLACEMENT  12/08/2009   Procedure: Right miniature anterior thoracotomy for aortic valve replacement (32-mm [facility] Ease pericardial tissue valve) Surgeon: [name], MD assistant: Kerin Perna, MD  . INGUINAL HERNIA REPAIR     Left    . INGUINAL HERNIA REPAIR  1960   right side  . TOOTH EXTRACTION Left August 2014   left bottom molar     Current Outpatient Medications  Medication Sig Dispense Refill  . amoxicillin (AMOXIL) 500 MG tablet Take 2,000 mg by mouth once as needed (takes prior to dental surgeries).     Marland Kitchen aspirin EC 81 MG tablet Take 1 tablet (81 mg total) by mouth daily. 90 tablet 3  . carvedilol (COREG) 3.125 MG tablet TAKE 1 TABLET (3.125 MG TOTAL) BY MOUTH 2 (TWO) TIMES DAILY WITH A MEAL. 180 tablet 2  . citalopram (CELEXA) 10 MG tablet Take 5 mg by mouth daily.   12  . Multiple Vitamin (MULTIVITAMIN) tablet Take 1 tablet by mouth daily.    Marland Kitchen warfarin (COUMADIN) 7.5 MG tablet Take as directed by [facility] 30 tablet 3  . losartan (COZAAR) 25 MG tablet Take 1 tablet (25 mg total) daily by mouth. 90 tablet 3   No current facility-administered medications for this visit.     Allergies:   No known allergies    Social History:  The patient  reports that  has never smoked. he has never used smokeless tobacco. He reports that he drinks about 0.6 oz of alcohol per week. He reports that he does not use drugs.   Family History:  The patient's family history includes Hypertension in his father and mother.    ROS:  Please see the history of present illness.   Otherwise, review of systems are positive for none.   All other systems are reviewed and negative.    PHYSICAL EXAM: VS:  BP 130/82   Pulse 65   Ht 6' (1.829 m)   Wt 184 lb (83.5 kg)   SpO2 97%   BMI 24.95 kg/m  , BMI Body mass index is 24.95 kg/m. Affect appropriate Healthy:  appears stated age HEENT: normal Neck supple with no adenopathy JVP normal no bruits no thyromegaly Lungs clear with no wheezing and good diaphragmatic motion Heart:  S1/S2 click SEM no AR  murmur, no rub, gallop or click PMI normal Abdomen: benighn, BS positve, no tenderness, no AAA no bruit.  No HSM or HJR Distal pulses intact with no bruits No edema Neuro  non-focal Skin warm and dry No muscular weakness     EKG:  SR rate 80 septal infarct lateral T wave changes 03/14/17    Recent Labs: 02/17/2017: ALT 28 02/22/2017: Magnesium 2.3 02/24/2017: BUN 17; Creatinine, Ser 0.98; Hemoglobin 9.4; Platelets 108; Potassium 3.6; Sodium 138    Lipid Panel No results found for: CHOL, TRIG, HDL, CHOLHDL, VLDL, LDLCALC, LDLDIRECT    Wt Readings from Last 3 Encounters:  11/02/17 184 lb (83.5 kg)  07/03/17 180 lb (81.6 kg)  06/15/17 177 lb 0.5 oz (80.3 kg)      Other studies Reviewed: Additional studies/ records that were reviewed today include:  TEE .02/21/17 Operative report Dr Cornelius Moras F/U echo June 2018    ASSESSMENT AND PLAN:  1.  Redo AVR :normal valve clicks baseline gradient elevated and still up with low EF He declines cath / Fluro or TEE at this time repeat echo 6 months  2. CABG emergent due to low coronary ostia SVG harvest sights A Does have a skeletal Deformity form previous right thoracotomy with visible lung protruding between ribs CT done 05/10/17 Indicated non fusion of sternum with "fracture fragments in area Dr Cornelius Moras indicated stable.  3. Anticoagulation Discussed having same amount of spinach weekly f/u clinic as indicated Notes easier bruising to be expected 4. Depression  Continue Celexa has separated from wife about a year ago Work going well "She was on a different spiritual path" 5. CHF: EF down added beta blocker last visit Add Cozaar 25 mg f/u echo in 6 months    Current medicines are reviewed at length with the patient today.  The patient does not have concerns regarding medicines.  The following changes have been made:  no change  Labs/ tests ordered today include: Echo   Orders Placed This Encounter  Procedures  . ECHOCARDIOGRAM COMPLETE     Disposition:   FU with me in 6 months      Signed, [name], MD  11/02/2017 9:16 AM    [facility] [address] [place], [place], [place]   [address] Phone: [phone]; Fax: [phone]

## 2017-11-02 ENCOUNTER — Encounter: Payer: Self-pay | Admitting: Cardiovascular Disease

## 2017-11-02 ENCOUNTER — Ambulatory Visit (INDEPENDENT_AMBULATORY_CARE_PROVIDER_SITE_OTHER): Payer: BLUE CROSS/BLUE SHIELD | Admitting: Cardiovascular Disease

## 2017-11-02 ENCOUNTER — Ambulatory Visit (INDEPENDENT_AMBULATORY_CARE_PROVIDER_SITE_OTHER): Payer: BLUE CROSS/BLUE SHIELD | Admitting: Pharmacist

## 2017-11-02 VITALS — BP 130/82 | HR 65 | Ht 72.0 in | Wt 184.0 lb

## 2017-11-02 DIAGNOSIS — Z951 Presence of aortocoronary bypass graft: Secondary | ICD-10-CM | POA: Diagnosis not present

## 2017-11-02 DIAGNOSIS — Z952 Presence of prosthetic heart valve: Secondary | ICD-10-CM

## 2017-11-02 DIAGNOSIS — Z954 Presence of other heart-valve replacement: Secondary | ICD-10-CM | POA: Diagnosis not present

## 2017-11-02 DIAGNOSIS — Z5181 Encounter for therapeutic drug level monitoring: Secondary | ICD-10-CM | POA: Diagnosis not present

## 2017-11-02 LAB — POCT INR: INR: 1.8

## 2017-11-02 MED ORDER — LOSARTAN POTASSIUM 25 MG PO TABS: 25.0000 mg | ORAL_TABLET | Freq: Every day | ORAL | 3 refills | Status: DC

## 2017-11-02 NOTE — Patient Instructions (Addendum)
Medication Instructions:  Your physician has recommended you make the following change in your medication:  1-START Cozaar (Lostaran) 25 mg by mouth daily   Labwork: NONE  Testing/Procedures: Your physician has requested that you have an echocardiogram 6 months. Echocardiography is a painless test that uses sound waves to create images of your heart. It provides your doctor with information about the size and shape of your heart and how well your heart's chambers and valves are working. This procedure takes approximately one hour. There are no restrictions for this procedure.  Follow-Up: Your physician wants you to follow-up in: 6 months with Dr. Eden Emms. You will receive a reminder letter in the mail two months in advance. If you don't receive a letter, please call our office to schedule the follow-up appointment.   If you need a refill on your cardiac medications before your next appointment, please call your pharmacy.

## 2017-11-22 ENCOUNTER — Ambulatory Visit (INDEPENDENT_AMBULATORY_CARE_PROVIDER_SITE_OTHER): Payer: BLUE CROSS/BLUE SHIELD | Admitting: *Deleted

## 2017-11-22 DIAGNOSIS — Z5181 Encounter for therapeutic drug level monitoring: Secondary | ICD-10-CM | POA: Diagnosis not present

## 2017-11-22 DIAGNOSIS — Z951 Presence of aortocoronary bypass graft: Secondary | ICD-10-CM

## 2017-11-22 DIAGNOSIS — Z952 Presence of prosthetic heart valve: Secondary | ICD-10-CM | POA: Diagnosis not present

## 2017-11-22 DIAGNOSIS — Z954 Presence of other heart-valve replacement: Secondary | ICD-10-CM

## 2017-11-22 LAB — POCT INR: INR: 2.5

## 2017-11-22 NOTE — Patient Instructions (Signed)
Continue taking 1 tablet (7.5mg ) daily except 1/2 tablet (3.75mg ) on Monday, Wednesday, Friday.  Recheck INR in 4 weeks.

## 2017-12-13 DIAGNOSIS — F4323 Adjustment disorder with mixed anxiety and depressed mood: Secondary | ICD-10-CM | POA: Diagnosis not present

## 2017-12-25 ENCOUNTER — Ambulatory Visit (INDEPENDENT_AMBULATORY_CARE_PROVIDER_SITE_OTHER): Payer: BLUE CROSS/BLUE SHIELD | Admitting: Pharmacist

## 2017-12-25 DIAGNOSIS — Z5181 Encounter for therapeutic drug level monitoring: Secondary | ICD-10-CM

## 2017-12-25 DIAGNOSIS — Z951 Presence of aortocoronary bypass graft: Secondary | ICD-10-CM | POA: Diagnosis not present

## 2017-12-25 DIAGNOSIS — Z952 Presence of prosthetic heart valve: Secondary | ICD-10-CM | POA: Diagnosis not present

## 2017-12-25 DIAGNOSIS — Z954 Presence of other heart-valve replacement: Secondary | ICD-10-CM | POA: Diagnosis not present

## 2017-12-25 LAB — POCT INR: INR: 2

## 2017-12-25 NOTE — Patient Instructions (Signed)
Continue taking 1 tablet (7.5mg) daily except 1/2 tablet (3.75mg) on Monday, Wednesday, Friday.  Recheck INR in 4 weeks.   

## 2018-01-30 DIAGNOSIS — H903 Sensorineural hearing loss, bilateral: Secondary | ICD-10-CM | POA: Diagnosis not present

## 2018-02-05 ENCOUNTER — Ambulatory Visit (INDEPENDENT_AMBULATORY_CARE_PROVIDER_SITE_OTHER): Payer: BLUE CROSS/BLUE SHIELD

## 2018-02-05 DIAGNOSIS — Z952 Presence of prosthetic heart valve: Secondary | ICD-10-CM | POA: Diagnosis not present

## 2018-02-05 DIAGNOSIS — Z954 Presence of other heart-valve replacement: Secondary | ICD-10-CM | POA: Diagnosis not present

## 2018-02-05 DIAGNOSIS — Z951 Presence of aortocoronary bypass graft: Secondary | ICD-10-CM

## 2018-02-05 DIAGNOSIS — Z5181 Encounter for therapeutic drug level monitoring: Secondary | ICD-10-CM

## 2018-02-05 LAB — POCT INR: INR: 2.6

## 2018-02-05 NOTE — Patient Instructions (Signed)
Description   Continue taking 1 tablet (7.5mg) daily except 1/2 tablet (3.75mg) on Mondays, Wednesdays,and Fridays.  Recheck INR in 6 weeks.       

## 2018-02-15 DIAGNOSIS — H9042 Sensorineural hearing loss, unilateral, left ear, with unrestricted hearing on the contralateral side: Secondary | ICD-10-CM | POA: Diagnosis not present

## 2018-02-19 ENCOUNTER — Encounter: Payer: BLUE CROSS/BLUE SHIELD | Admitting: Thoracic Surgery (Cardiothoracic Vascular Surgery)

## 2018-02-26 ENCOUNTER — Ambulatory Visit (INDEPENDENT_AMBULATORY_CARE_PROVIDER_SITE_OTHER): Payer: BLUE CROSS/BLUE SHIELD | Admitting: Thoracic Surgery (Cardiothoracic Vascular Surgery)

## 2018-02-26 ENCOUNTER — Encounter: Payer: Self-pay | Admitting: Thoracic Surgery (Cardiothoracic Vascular Surgery)

## 2018-02-26 ENCOUNTER — Other Ambulatory Visit: Payer: Self-pay

## 2018-02-26 VITALS — BP 112/75 | HR 71 | Resp 18 | Ht 72.0 in | Wt 189.8 lb

## 2018-02-26 DIAGNOSIS — Z954 Presence of other heart-valve replacement: Secondary | ICD-10-CM | POA: Diagnosis not present

## 2018-02-26 NOTE — Progress Notes (Signed)
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 His subsequent postoperative recovery was remarkably uneventful and clinically he has done well.  He was last seen in our office on July 03, 2017 at which time he was doing well clinically.  He had noticed weakness between the second and third costal cartilages on the right side with a tendency of chest wall contents to herniate with coughing or Valsalva.  This did not cause any pain and was clearly related to his previous right minithoracotomy incision.  In addition, follow-up transthoracic echocardiogram performed June 19, 2017 revealed somewhat elevated transvalvular gradient across the aortic valve prosthesis.  A second follow-up echocardiogram was performed October 30, 2017 with similar findings.  Peak velocity across the aortic valve was measured 3.6 m/s corresponding to mean transvalvular gradient estimated 30 mmHg.  The leaflets of the aortic valve were not well visualized.  Left ventricular systolic function was moderately reduced with ejection fraction estimated 35-40% consistent with the TEE performed at the time of his surgery on February 21, 2017.  The patient was seen in follow-up by Dr. Eden Emms on November 02, 2017.  Catheterization for fluoroscopy of the  patient's mechanical aortic valve was discussed but apparently the patient declined at that time.  He returns to our office today and reports that he is clinically doing well.  Overall he states that he feels "much improved" in comparison with how he felt prior to redo aortic valve replacement.  He specifically denies any symptoms of exertional shortness of breath.  He states that his exercise tolerance is fairly good.  He admits that he does not push himself, but he continues to exercise on a nearly daily basis, performing 20-30 minutes of aerobic exercise.  He has never had any chest pain or chest tightness.  He reports that his functional status is good.  His last 3 Coumadin checks have been notable for therapeutic INR, whereas last fall he was consistently subtherapeutic.   Current Outpatient Medications  Medication Sig Dispense Refill  . amoxicillin (AMOXIL) 500 MG tablet Take 2,000 mg by mouth once as needed (takes prior to dental surgeries).     Marland Kitchen aspirin EC 81 MG tablet Take 1 tablet (81 mg total) by mouth daily. 90 tablet 3  . carvedilol (COREG) 3.125 MG tablet TAKE 1 TABLET (3.125 MG TOTAL) BY MOUTH 2 (TWO) TIMES DAILY WITH A MEAL. 180 tablet 2  . losartan (COZAAR) 25 MG tablet Take 1 tablet (25 mg total) daily by mouth. 90 tablet 3  . Multiple Vitamin (MULTIVITAMIN) tablet Take 1 tablet by mouth daily.    Marland Kitchen warfarin (COUMADIN) 7.5 MG tablet Take as directed by [facility] 30 tablet 3   No current facility-administered medications for this visit.  Physical Exam:   BP 112/75 (BP Location: Right Arm, Patient Position: Sitting, Cuff Size: Large)   Pulse 71   Resp 18   Ht 6' (1.829 m)   Wt 189 lb 12.8 oz (86.1 kg)   SpO2 98% Comment: RA  BMI 25.74 kg/m   General:  Well-appearing  Chest:   Clear to auscultation  CV:   Regular rate and rhythm with soft systolic murmur heard along the sternal border, crisp heart valve sounds  Incisions:  Completely healed.  There remains  weakness between the insertion of the second and third costal cartilages on the chest wall with mild bulging of the soft tissues notable with coughing or Valsalva, but the cartilages do not move in the sternotomy has healed nicely.  Abdomen:  Soft nontender  Extremities:  Warm and well perfused  Diagnostic Tests:  Transthoracic Echocardiography  (Report amended )  Patient:    [name], [name] MR #:       [id number] Study Date: 10/30/2017 Gender:     M Age:        61 Height:     182.9 cm Weight:     81.6 kg BSA:        2.04 m^2 Pt. Status: Room:   Layla Maw, M.D.  REFERRING    [name], M.D.  ATTENDING    Armanda Magic, MD  REFERRING    Lupe Carney  PERFORMING   [facility], Outpatient  SONOGRAPHER  [facility], RDCS  REFERRING    Clovis Riley, L.dean  cc:  ------------------------------------------------------------------- LV EF: 35% -   40%  ------------------------------------------------------------------- Indications:      Status Post Aortic Valve Replacement (Z95.4).  ------------------------------------------------------------------- History:   PMH:  Aortic Valve Replacement (2010), Aortic Valve Re-Do (Feb, 2018 with Mechanical size 21.) Non-Ischemic Cardiomyopathy.  Dyspnea.  Aortic valve disease.  ------------------------------------------------------------------- Study Conclusions  - Left ventricle: The cavity size was mildly dilated. Systolic   function was moderately reduced. The estimated ejection fraction   was in the range of 35% to 40%. There is akinesis of the   entireanteroseptal, inferoseptal, and apical myocardium. There is   akinesis of the apicalanterior, lateral, and inferolateral   myocardium. - Aortic valve: Gradients still high post re do AVR DVI 18 abnormal   especially given decreased EF compared to last echo. There was   trivial regurgitation. There was mild perivalvular regurgitation.   Mean velocity (S): 259  cm/s. Peak gradient (S): 51 mm Hg. Valve   area (VTI): 0.73 cm^2. Valve area (Vmax): 0.78 cm^2. Valve area   (Vmean): 0.66 cm^2. - Mitral valve: Mild diffuse calcification of the anterior leaflet.   There was mild regurgitation. - Left atrium: The atrium was mildly dilated. - Right ventricle: The cavity size was mildly dilated. Wall   thickness was normal. - Tricuspid valve: There was mild regurgitation. - Pulmonary arteries: PA peak pressure: 31 mm Hg (S).  ------------------------------------------------------------------- Labs, prior tests, procedures, and surgery: Coronary artery bypass grafting.  ------------------------------------------------------------------- Study data:  Comparison was made to the study of 06/19/2017.  Study status:  Routine.  Procedure:  Transthoracic echocardiography. Image quality was adequate.          Transthoracic echocardiography.  M-mode, complete 2D, 3D, spectral Doppler, and color Doppler.  Birthdate:  Patient birthdate: 1956-04-15.  Age: Patient is 62 yr old.  Sex:  Gender: male.    BMI: 24.4 kg/m^2. Blood pressure:     118/57  Patient status:  Outpatient.  Study date:  Study date: 10/30/2017. Study time: 09:47 AM.  Location: [facility]  -------------------------------------------------------------------  ------------------------------------------------------------------- Left ventricle:  The cavity size was mildly dilated. Systolic function was moderately reduced. The estimated ejection fraction was in the range of 35% to 40%.  Regional wall motion abnormalities:   There is akinesis of the entireanteroseptal, inferoseptal, and apical myocardium.  There is akinesis of the apicalanterior, lateral, and inferolateral myocardium.  ------------------------------------------------------------------- Aortic valve:  Gradients still high post re do AVR DVI 18 abnormal especially given decreased EF compared to last echo.  Normal thickness  leaflets. Mobility was not restricted.  Doppler: Transvalvular velocity was within the normal range. There was no stenosis. There was trivial regurgitation. There was mild perivalvular regurgitation.    VTI ratio of LVOT to aortic valve: 0.19. Valve area (VTI): 0.73 cm^2. Indexed valve area (VTI): 0.36 cm^2/m^2. Peak velocity ratio of LVOT to aortic valve: 0.21. Valve area (Vmax): 0.78 cm^2. Indexed valve area (Vmax): 0.38 cm^2/m^2. Mean velocity ratio of LVOT to aortic valve: 0.17. Valve area (Vmean): 0.66 cm^2. Indexed valve area (Vmean): 0.32 cm^2/m^2. Mean gradient (S): 30 mm Hg. Peak gradient (S): 51 mm Hg.  ------------------------------------------------------------------- Aorta:  Aortic root: The aortic root was normal in size.  ------------------------------------------------------------------- Mitral valve:   Mild diffuse calcification of the anterior leaflet. Mobility was not restricted.  Doppler:  Transvalvular velocity was within the normal range. There was no evidence for stenosis. There was mild regurgitation.    Peak gradient (D): 2 mm Hg.  ------------------------------------------------------------------- Left atrium:  The atrium was mildly dilated.  ------------------------------------------------------------------- Right ventricle:  The cavity size was mildly dilated. Wall thickness was normal. Systolic function was normal.  ------------------------------------------------------------------- Pulmonic valve:    Structurally normal valve.   Cusp separation was normal.  Doppler:  Transvalvular velocity was within the normal range. There was no evidence for stenosis. There was no regurgitation.  ------------------------------------------------------------------- Tricuspid valve:   Structurally normal valve.    Doppler: Transvalvular velocity was within the normal range. There was  mild regurgitation.  ------------------------------------------------------------------- Pulmonary artery:   The main pulmonary artery was normal-sized. Systolic pressure was within the normal range.  ------------------------------------------------------------------- Right atrium:  The atrium was normal in size.  ------------------------------------------------------------------- Pericardium:  There was no pericardial effusion.  ------------------------------------------------------------------- Systemic veins: Inferior vena cava: The vessel was normal in size.  ------------------------------------------------------------------- Measurements   Left ventricle                           Value          Reference  LV ID, ED, PLAX chordal          (H)     54.1  mm       43 - 52  LV ID, ES, PLAX chordal          (H)     43.1  mm       23 - 38  LV fx shortening, PLAX chordal   (L)     20    %        >=29  LV PW thickness, ED                      10.6  mm       ----------  IVS/LV PW ratio, ED                      0.9            <=  1.3  Stroke volume, 2D                        64    ml       ----------  Stroke volume/bsa, 2D                    31    ml/m^2   ----------  LV e&', lateral                           9.9   cm/s     ----------  LV E/e&', lateral                         7.39           ----------  LV e&', medial                            7.07  cm/s     ----------  LV E/e&', medial                          10.35          ----------  LV e&', average                           8.49  cm/s     ----------  LV E/e&', average                         8.63           ----------    Ventricular septum                       Value          Reference  IVS thickness, ED                        9.55  mm       ----------    LVOT                                     Value          Reference  LVOT ID, S                               22    mm       ----------  LVOT area                                 3.8   cm^2     ----------  LVOT peak velocity, S                    73.2  cm/s     ----------  LVOT mean velocity, S                    44.7  cm/s     ----------  LVOT VTI, S  16.8  cm       ----------    Aortic valve                             Value          Reference  Aortic valve peak velocity, S            357   cm/s     ----------  Aortic valve mean velocity, S            259   cm/s     ----------  Aortic valve VTI, S                      87.4  cm       ----------  Aortic mean gradient, S                  30    mm Hg    ----------  Aortic peak gradient, S                  51    mm Hg    ----------  VTI ratio, LVOT/AV                       0.19           ----------  Aortic valve area, VTI                   0.73  cm^2     ----------  Aortic valve area/bsa, VTI               0.36  cm^2/m^2 ----------  Velocity ratio, peak, LVOT/AV            0.21           ----------  Aortic valve area, peak velocity         0.78  cm^2     ----------  Aortic valve area/bsa, peak              0.38  cm^2/m^2 ----------  velocity  Velocity ratio, mean, LVOT/AV            0.17           ----------  Aortic valve area, mean velocity         0.66  cm^2     ----------  Aortic valve area/bsa, mean              0.32  cm^2/m^2 ----------  velocity    Aorta                                    Value          Reference  Aortic root ID, ED                       32    mm       ----------  Ascending aorta ID, A-P, S               33    mm       ----------    Left atrium  Value          Reference  LA ID, A-P, ES                           46    mm       ----------  LA ID/bsa, A-P                   (H)     2.25  cm/m^2   <=2.2  LA volume, S                             82.4  ml       ----------  LA volume/bsa, S                         40.4  ml/m^2   ----------  LA volume, ES, 1-p A4C                   90.8  ml       ----------  LA volume/bsa, ES, 1-p A4C                44.5  ml/m^2   ----------  LA volume, ES, 1-p A2C                   71.9  ml       ----------  LA volume/bsa, ES, 1-p A2C               35.2  ml/m^2   ----------    Mitral valve                             Value          Reference  Mitral E-wave peak velocity              73.2  cm/s     ----------  Mitral A-wave peak velocity              50.4  cm/s     ----------  Mitral deceleration time         (H)     327   ms       150 - 230  Mitral peak gradient, D                  2     mm Hg    ----------  Mitral E/A ratio, peak                   1.5            ----------    Pulmonary arteries                       Value          Reference  PA pressure, S, DP               (H)     31    mm Hg    <=30    Tricuspid valve                          Value          Reference  Tricuspid regurg peak velocity  265   cm/s     ----------  Tricuspid peak RV-RA gradient            28    mm Hg    ----------    Right atrium                             Value          Reference  RA ID, S-I, ES, A4C              (H)     49.5  mm       34 - 49  RA area, ES, A4C                         16.2  cm^2     8.3 - 19.5  RA volume, ES, A/L                       42.7  ml       ----------  RA volume/bsa, ES, A/L                   20.9  ml/m^2   ----------    Right ventricle                          Value          Reference  TAPSE                                    21.2  mm       ----------  RV s&', lateral, S                        11.2  cm/s     ----------  Legend: (L)  and  (H)  mark values outside specified reference range.  ------------------------------------------------------------------- Cindie Laroche, M.D. 2018-11-05T14:16:24   Impression:  Patient remains clinically stable approximately 1 year status post redo aortic valve replacement using a mechanical prosthetic valve and coronary artery bypass grafting x3.  Follow-up transthoracic echocardiograms have demonstrated moderately  elevated gradients across the patient's aortic valve, consistent with likely patient prosthesis mismatch.  It would not be unreasonable to consider fluoroscopy and/or transesophageal echocardiography to make sure that both leaflets are moving normally and there is no sign of impingement.   Plan:  We have not made any recommendations to change any of the patient's medications.  I have discussed the reasons to consider fluoroscopy to make certain that the aortic valve prosthesis is functioning appropriately with the patient in the office today.  He desires to wait and discuss this further when he sees Dr. Eden Emms for follow-up in approximately 6 weeks.  Given the fact that the patient remains quite stable clinically and essentially asymptomatic, I think this is a reasonable plan.  All of his questions have been addressed.  In the future he will call and return to see [place] only should specific problems or questions arise.   I spent in excess of 15 minutes during the conduct of this office consultation and >50% of this time involved direct face-to-face encounter with the patient for counseling and/or coordination of their care.    [name], MD 02/26/2018 3:26 PM

## 2018-02-26 NOTE — Patient Instructions (Signed)

## 2018-03-19 ENCOUNTER — Ambulatory Visit (INDEPENDENT_AMBULATORY_CARE_PROVIDER_SITE_OTHER): Payer: BLUE CROSS/BLUE SHIELD | Admitting: Pharmacist

## 2018-03-19 DIAGNOSIS — Z952 Presence of prosthetic heart valve: Secondary | ICD-10-CM

## 2018-03-19 DIAGNOSIS — Z954 Presence of other heart-valve replacement: Secondary | ICD-10-CM

## 2018-03-19 DIAGNOSIS — Z5181 Encounter for therapeutic drug level monitoring: Secondary | ICD-10-CM

## 2018-03-19 DIAGNOSIS — Z951 Presence of aortocoronary bypass graft: Secondary | ICD-10-CM

## 2018-03-19 LAB — POCT INR: INR: 2.3

## 2018-03-19 NOTE — Patient Instructions (Signed)
Continue taking 1 tablet (7.5mg) daily except 1/2 tablet (3.75mg) on Mondays, Wednesdays,and Fridays.  Recheck INR in 6 weeks. 

## 2018-04-17 DIAGNOSIS — S91001A Unspecified open wound, right ankle, initial encounter: Secondary | ICD-10-CM | POA: Diagnosis not present

## 2018-04-17 DIAGNOSIS — T148XXA Other injury of unspecified body region, initial encounter: Secondary | ICD-10-CM | POA: Diagnosis not present

## 2018-04-25 ENCOUNTER — Other Ambulatory Visit: Payer: Self-pay

## 2018-04-25 ENCOUNTER — Ambulatory Visit (HOSPITAL_COMMUNITY): Payer: BLUE CROSS/BLUE SHIELD | Attending: Cardiology

## 2018-04-25 DIAGNOSIS — I509 Heart failure, unspecified: Secondary | ICD-10-CM | POA: Diagnosis not present

## 2018-04-25 DIAGNOSIS — Z951 Presence of aortocoronary bypass graft: Secondary | ICD-10-CM | POA: Insufficient documentation

## 2018-04-25 DIAGNOSIS — I359 Nonrheumatic aortic valve disorder, unspecified: Secondary | ICD-10-CM | POA: Diagnosis not present

## 2018-04-25 DIAGNOSIS — Z952 Presence of prosthetic heart valve: Secondary | ICD-10-CM

## 2018-04-25 DIAGNOSIS — I428 Other cardiomyopathies: Secondary | ICD-10-CM | POA: Insufficient documentation

## 2018-04-30 ENCOUNTER — Ambulatory Visit (INDEPENDENT_AMBULATORY_CARE_PROVIDER_SITE_OTHER): Payer: BLUE CROSS/BLUE SHIELD | Admitting: Pharmacist

## 2018-04-30 ENCOUNTER — Encounter: Payer: Self-pay | Admitting: Cardiovascular Disease

## 2018-04-30 ENCOUNTER — Ambulatory Visit (INDEPENDENT_AMBULATORY_CARE_PROVIDER_SITE_OTHER): Payer: BLUE CROSS/BLUE SHIELD | Admitting: Cardiovascular Disease

## 2018-04-30 VITALS — BP 118/80 | HR 60 | Ht 72.0 in | Wt 189.1 lb

## 2018-04-30 DIAGNOSIS — Z954 Presence of other heart-valve replacement: Secondary | ICD-10-CM

## 2018-04-30 DIAGNOSIS — Z951 Presence of aortocoronary bypass graft: Secondary | ICD-10-CM | POA: Diagnosis not present

## 2018-04-30 DIAGNOSIS — Z7901 Long term (current) use of anticoagulants: Secondary | ICD-10-CM | POA: Diagnosis not present

## 2018-04-30 DIAGNOSIS — Z5181 Encounter for therapeutic drug level monitoring: Secondary | ICD-10-CM

## 2018-04-30 DIAGNOSIS — I509 Heart failure, unspecified: Secondary | ICD-10-CM

## 2018-04-30 DIAGNOSIS — Z952 Presence of prosthetic heart valve: Secondary | ICD-10-CM | POA: Diagnosis not present

## 2018-04-30 LAB — POCT INR: INR: 2.8

## 2018-04-30 NOTE — Patient Instructions (Signed)
Continue taking 1 tablet (7.5mg ) daily except 1/2 tablet (3.75mg ) on Mondays, Wednesdays,and Fridays.  Recheck INR in 6 weeks.

## 2018-04-30 NOTE — Patient Instructions (Signed)

## 2018-04-30 NOTE — Progress Notes (Signed)
Cardiology Office Note   Date:  04/30/2018   ID:  [name], DOB 1956/05/28, MRN [id number]  PCP:  Asencion Gowda.[name], MD  Cardiologist:   [name], MD   No chief complaint on file.     History of Present Illness: [name] is a 62 y.o. male who presents for f/u of redo AVR  Sees Dr Cornelius Moras Careers adviser.   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  The patient's coronary arteries were noted to originate extremely close to the aortic annulus. 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   Echo done 04/25/18 with EF 30% Mean gradient 30 mmHg peak 56 mmHg DVI .64   Dr Cornelius Moras saw him 02/26/18 and recommended fluoroscopy at least to look at AVR  He feels fine Exercising a lot Still going through divorce Coumadin a bit hard to regulare Discussed high gradients and low EF with him He declines cath or TEE for now Also does not really want to change meds or have procedures since he feels fine  He hiked 12 miles near [place] last month had bruise on right ankle from sprain But no dyspnea, chest pain or lightheadedness   He had a point as well in that given his asymptomatic state even if fluoro was abnormal in some Way we would not do anything different other than make sure INR;s Rx   Past Medical History:  Diagnosis Date  . Aortic insufficiency   . Aortic valve disease    a. Biscuspid AVR with heavy annular calcification s/p tissue AVR (pt preferred). b. Mod-severe perivalvular regurg by cath 08/2013, mod by TEE 08/2013, gradients lower by TEE than by echo.   .  Complication of anesthesia    when intubated  damage to vocal after surgery  2010  . Depression   . Frequent urination   . Hearing loss    bilateral, pt wears hearing aids   . Nonischemic cardiomyopathy (HCC)    a. EF normal immediately after AVR, then decreased to 40-45% by echo & 34% by MRI 2011. b. Most recent EF 40-45% by echo/TEE 08/2013.  . Paravalvular leak of prosthetic heart valve   . Prosthetic valve dysfunction   . Reflux   . Rosacea   . S/P aortic valve replacement with bioprosthetic valve 12/08/2009   45mm [facility] Ease bovine pericardial tissue valve placed via right mini thoracotomy approach  . 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 Shortness of breath    with exertion  . Thrombocytopenia (HCC)     Past Surgical History:  Procedure Laterality Date  . AORTIC VALVE REPLACEMENT  12/08/2009   Procedure: Right miniature anterior thoracotomy for aortic valve replacement (32-mm [facility] Ease pericardial tissue valve) Surgeon: [name], MD assistant: Kerin Perna, MD  . AORTIC VALVE REPLACEMENT N/A 02/21/2017   Procedure: REDO AORTIC VALVE REPLACEMENT (AVR) implanted with Carbomedics Supra- Annular (Top  Hat) size 21;  Surgeon: Purcell Nails, MD;  Location: [facility] OR;  Service: Open Heart Surgery;  Laterality: N/A;  . CARDIAC CATHETERIZATION N/A 01/04/2017   Procedure: Right/Left Heart Cath and Coronary Angiography;  Surgeon: Lyn Records, MD;  Location: [facility] INVASIVE CV LAB;  Service: Cardiovascular;  Laterality: N/A;  . CORONARY ARTERY BYPASS GRAFT N/A 02/21/2017   Procedure: CORONARY ARTERY BYPASS GRAFTING (CABG) x3 with left internal mammary artery and left greater saphenous vein harvested endoscopically;  Surgeon: Purcell Nails, MD;  Location: [facility] OR;  Service: Open Heart  Surgery;  Laterality: N/A;  . INGUINAL HERNIA REPAIR     Left  . INGUINAL HERNIA REPAIR  1960   right side  . LEFT AND RIGHT HEART CATHETERIZATION WITH CORONARY ANGIOGRAM N/A 09/19/2013   Procedure: LEFT AND RIGHT HEART CATHETERIZATION WITH CORONARY ANGIOGRAM;  Surgeon: Wendall Stade, MD;  Location: [facility] CATH LAB;  Service: Cardiovascular;  Laterality: N/A;  . TEE WITHOUT CARDIOVERSION N/A 09/19/2013   Procedure: TRANSESOPHAGEAL ECHOCARDIOGRAM (TEE);  Surgeon: Wendall Stade, MD;  Location: [facility] ENDOSCOPY;  Service: Cardiovascular;  Laterality: N/A;  . TEE WITHOUT CARDIOVERSION N/A 02/21/2017   Procedure: TRANSESOPHAGEAL ECHOCARDIOGRAM (TEE);  Surgeon: Purcell Nails, MD;  Location[facility] OR;  Service: Open Heart Surgery;  Laterality: N/A;  . TOOTH EXTRACTION Left August 2014   left bottom molar     Current Outpatient Medications  Medication Sig Dispense Refill  . amoxicillin (AMOXIL) 500 MG tablet Take 2,000 mg by mouth once as needed (takes prior to dental surgeries).     Marland Kitchen aspirin EC 81 MG tablet Take 1 tablet (81 mg total) by mouth daily. 90 tablet 3  . carvedilol (COREG) 3.125 MG tablet TAKE 1 TABLET (3.125 MG TOTAL) BY MOUTH 2 (TWO) TIMES DAILY WITH A MEAL. 180 tablet 2  . losartan (COZAAR) 25 MG tablet Take 1 tablet (25 mg total) daily by mouth. 90 tablet 3  . Multiple Vitamin (MULTIVITAMIN) tablet Take 1 tablet by mouth daily.    Marland Kitchen warfarin (COUMADIN) 7.5 MG tablet Take as directed by [facility] 30 tablet 3   No current facility-administered medications for this visit.     Allergies:   No known allergies    Social History:  The patient  reports that he has never smoked. He has never used smokeless tobacco. He reports that he drinks about 0.6 oz of alcohol per week. He reports that he does not use drugs.   Family History:  The patient's family history includes Hypertension in his father and mother.    ROS:  Please see the history of present illness.   Otherwise, review of  systems are positive for none.   All other systems are reviewed and negative.    PHYSICAL EXAM: VS:  BP 118/80   Pulse 60   Ht 6' (1.829 m)   Wt 189 lb 1.9 oz (85.8 kg)   SpO2 97%   BMI 25.65 kg/m  , BMI Body mass index is 25.65 kg/m. Affect appropriate Healthy:  appears stated age HEENT: normal Neck supple with no adenopathy JVP normal no bruits no thyromegaly Lungs clear with no wheezing and good diaphragmatic motion Heart:  S1/S2 click SEM no AR  murmur, no rub, gallop or click PMI normal Abdomen: benighn, BS positve, no tenderness, no AAA no bruit.  No HSM or HJR Distal pulses intact with no bruits No edema Neuro non-focal Skin warm and dry No muscular weakness     EKG:  SR rate 80 septal infarct lateral T wave changes 03/14/17 04/30/18 SR rate 60 LVH no acute changes    Recent Labs: No results found for requested labs within last 8760 hours.    Lipid Panel No results found for: CHOL, TRIG, HDL, CHOLHDL, VLDL, LDLCALC, LDLDIRECT    Wt Readings from Last 3 Encounters:  04/30/18 189 lb 1.9 oz (85.8 kg)  02/26/18 189 lb 12.8 oz (86.1 kg)  11/02/17 184 lb (83.5 kg)      Other studies Reviewed: Additional studies/ records that were reviewed today include: TEE .02/21/17 Operative report Dr Cornelius Moras F/U echo June 2018    ASSESSMENT AND PLAN:  1.  Redo AVR :normal valve clicks baseline gradient elevated and still up with low EF DVI Also abnormal at .18  He declines cath / Fluro or TEE at this time repeat echo 6 months  2. CABG emergent due to low coronary ostia SVG harvest sights A Does have a skeletal Deformity form previous right thoracotomy with visible lung protruding between ribs CT done 05/10/17 Indicated non fusion of sternum with "fracture fragments in area Dr Cornelius Moras indicated stable.  3. Anticoagulation Discussed having same amount of spinach weekly f/u clinic as indicated Notes easier bruising to be expected 4. Depression  Continue Celexa has separated from  wife about a year ago Work going well "She was on a different spiritual path" 5. CHF: EF 30% by echo 04/25/18  Discussed need to optimize medical Rx and AICD.  Declines entresto And AICD at this time    Current medicines are reviewed at length with the patient today.  The patient does not have concerns regarding medicines.  The following changes have been made:  no change  Labs/ tests ordered today include:    No orders of the defined types were placed in this encounter.    Disposition:   FU with me in 6 months      Signed, [name], MD  04/30/2018 9:21 AM    [facility] [address] [place], [place], [place]  [address] Phone: [phone]; Fax: [phone]

## 2018-05-02 ENCOUNTER — Ambulatory Visit: Payer: BLUE CROSS/BLUE SHIELD | Admitting: Cardiovascular Disease

## 2018-06-11 ENCOUNTER — Ambulatory Visit (INDEPENDENT_AMBULATORY_CARE_PROVIDER_SITE_OTHER): Payer: BLUE CROSS/BLUE SHIELD | Admitting: *Deleted

## 2018-06-11 DIAGNOSIS — Z952 Presence of prosthetic heart valve: Secondary | ICD-10-CM | POA: Diagnosis not present

## 2018-06-11 DIAGNOSIS — Z951 Presence of aortocoronary bypass graft: Secondary | ICD-10-CM

## 2018-06-11 DIAGNOSIS — Z954 Presence of other heart-valve replacement: Secondary | ICD-10-CM

## 2018-06-11 DIAGNOSIS — Z5181 Encounter for therapeutic drug level monitoring: Secondary | ICD-10-CM | POA: Diagnosis not present

## 2018-06-11 LAB — POCT INR: INR: 2.9 (ref 2.0–3.0)

## 2018-06-11 NOTE — Patient Instructions (Signed)
Description   Continue taking 1 tablet (7.5mg) daily except 1/2 tablet (3.75mg) on Mondays, Wednesdays,and Fridays.  Recheck INR in 6 weeks.       

## 2018-06-29 DIAGNOSIS — I2510 Atherosclerotic heart disease of native coronary artery without angina pectoris: Secondary | ICD-10-CM | POA: Diagnosis not present

## 2018-06-29 DIAGNOSIS — R05 Cough: Secondary | ICD-10-CM | POA: Diagnosis not present

## 2018-06-29 DIAGNOSIS — L723 Sebaceous cyst: Secondary | ICD-10-CM | POA: Diagnosis not present

## 2018-07-02 DIAGNOSIS — R509 Fever, unspecified: Secondary | ICD-10-CM | POA: Diagnosis not present

## 2018-07-09 ENCOUNTER — Telehealth: Payer: Self-pay | Admitting: *Deleted

## 2018-07-09 ENCOUNTER — Encounter: Payer: Self-pay | Admitting: Cardiovascular Disease

## 2018-07-09 NOTE — Telephone Encounter (Signed)
Pt states he was started on Cipro 500 mg bid for fever and infection on Friday July 12th and was instructed to reduced coumadin to 1/2 tablet daily  He did take coumadin 1/2 tablet on Saturday and Sunday Instructed to take 1 tablet today and made an appt for him to have INR checked tomorrow and pt states understanding

## 2018-07-10 ENCOUNTER — Telehealth: Payer: Self-pay

## 2018-07-10 ENCOUNTER — Telehealth (HOSPITAL_COMMUNITY): Payer: Self-pay | Admitting: Cardiovascular Disease

## 2018-07-10 ENCOUNTER — Ambulatory Visit (INDEPENDENT_AMBULATORY_CARE_PROVIDER_SITE_OTHER): Payer: BLUE CROSS/BLUE SHIELD | Admitting: Pharmacist

## 2018-07-10 DIAGNOSIS — Z954 Presence of other heart-valve replacement: Secondary | ICD-10-CM

## 2018-07-10 DIAGNOSIS — Z951 Presence of aortocoronary bypass graft: Secondary | ICD-10-CM

## 2018-07-10 DIAGNOSIS — Z5181 Encounter for therapeutic drug level monitoring: Secondary | ICD-10-CM | POA: Diagnosis not present

## 2018-07-10 DIAGNOSIS — Z952 Presence of prosthetic heart valve: Secondary | ICD-10-CM

## 2018-07-10 DIAGNOSIS — R509 Fever, unspecified: Secondary | ICD-10-CM

## 2018-07-10 LAB — POCT INR: INR: 2.2 (ref 2.0–3.0)

## 2018-07-10 NOTE — Patient Instructions (Signed)
Description   Continue taking 1 tablet (7.5mg ) daily except 1/2 tablet (3.75mg ) on Mondays, Wednesdays,and Fridays.  IF stay on Cipro take only 1/2 tablet on Thursdays. Recheck INR in 3 weeks.

## 2018-07-10 NOTE — Telephone Encounter (Signed)
-----  Message from Josue Hector, MD sent at 07/10/2018 10:26 AM EDT ----- Yes he does Pam - have him get ESR, 2 blood cultures and start with TTE echo F/U with me to discuss possible TEE  ----- Message ----- From: Erskine Emery, [facility] Sent: 07/10/2018   8:43 AM To: Josue Hector, MD  Dr. Johnsie Cancel,  [name] just wanted to let you know that he has had about 25 days of unexplained fever for which he is now on cipro. This is being followed by his primary doctor. They have been unable to identify source of fever. He wanted you to be aware since he does have a valve replacement and to see if you thought he needed further cardiology workup for this. Thanks! Georgina Peer

## 2018-07-10 NOTE — Telephone Encounter (Signed)
User: Trina Ao A Date/time: 07/10/18 3:11 PM  Comment: Called pt and lmsg for him to CB to get sch for an echo.Edmonia Caprio  Context:  Outcome: Left Message  Phone number: [phone] Phone Type: Home Phone  Comm. type: Telephone Call type: Outgoing  Contact: Lucilla Lame Relation to patient: Self

## 2018-07-10 NOTE — Telephone Encounter (Signed)
Patient will come in on Friday for lab work and echo. Patient scheduled to see Dr. Eden Emms on 07/20/18.

## 2018-07-12 NOTE — Addendum Note (Signed)
Addended by: Virl Axe, Dhanya Bogle L on: 07/12/2018 08:04 AM   Modules accepted: Orders

## 2018-07-13 ENCOUNTER — Other Ambulatory Visit: Payer: Self-pay

## 2018-07-13 ENCOUNTER — Ambulatory Visit (HOSPITAL_COMMUNITY): Payer: BLUE CROSS/BLUE SHIELD | Attending: Cardiology

## 2018-07-13 DIAGNOSIS — R509 Fever, unspecified: Secondary | ICD-10-CM | POA: Diagnosis not present

## 2018-07-13 DIAGNOSIS — I428 Other cardiomyopathies: Secondary | ICD-10-CM | POA: Diagnosis not present

## 2018-07-13 DIAGNOSIS — I059 Rheumatic mitral valve disease, unspecified: Secondary | ICD-10-CM | POA: Diagnosis not present

## 2018-07-13 DIAGNOSIS — Z952 Presence of prosthetic heart valve: Secondary | ICD-10-CM | POA: Diagnosis not present

## 2018-07-13 NOTE — Progress Notes (Signed)
Cardiology Office Note   Date:  07/20/2018   ID:  [name], DOB 11-02-1956, MRN [id number]  PCP:  Aurea Graff.Marlou Sa, MD  Cardiologist:   Jenkins Rouge, MD   No chief complaint on file.     History of Present Illness: [name] is a 62 y.o. male who presents for f/u of redo AVR  Sees Dr Roxy Manns Psychologist, sport and exercise.   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  The patient's coronary arteries were noted to originate extremely close to the aortic annulus. 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   Echo done 04/25/18 with EF 30% Mean gradient 30 mmHg peak 56 mmHg DVI .50   Dr Roxy Manns saw him 02/26/18 and recommended fluoroscopy at least to look at AVR Patient Declined further testing at that time Felt fine hiking and walking without dyspnea   Apparently has been having low grade fevers the last few weeks with no obvious etiology  ESR 33 WBC and Hct normal BC;s ordered  TTE done 07/13/18 reviewed EF 30-35% RWMAls anterior MI AVR well seated trivial AR mean gradient 28 mmHg high but stable DVI abnormal .25  No evidence of SBE  He has not had CXR and wants his TSH/T4 checked He declines TEE at this time but will consider if fevers resume after finishing his last 2 Days of Cipro   Past Medical History:  Diagnosis Date  . Aortic insufficiency   . Aortic valve disease    a. Biscuspid AVR with heavy annular calcification s/p tissue AVR (pt preferred). b. Mod-severe perivalvular regurg by cath 08/2013, mod by TEE 08/2013, gradients lower by TEE than by echo.   . Complication of  anesthesia    when intubated  damage to vocal after surgery  2010  . Depression   . Frequent urination   . Hearing loss    bilateral, pt wears hearing aids   . Nonischemic cardiomyopathy ([place])    a. EF normal immediately after AVR, then decreased to 40-45% by echo & 34% by MRI 2011. b. Most recent EF 40-45% by echo/TEE 08/2013.  . Paravalvular leak of prosthetic heart valve   . Prosthetic valve dysfunction   . Reflux   . Rosacea   . S/P aortic valve replacement with bioprosthetic valve 12/08/2009   [facility] placed via right mini thoracotomy approach  . 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 Shortness of breath    with exertion  . [place]     Past Surgical History:  Procedure Laterality Date  . AORTIC VALVE REPLACEMENT  12/08/2009   Procedure: Right miniature anterior thoracotomy for aortic valve replacement [facility]) Surgeon: CValentina Gu ORoxy Manns MD assistant: PTharon AquasTrigt, MD  . AORTIC VALVE REPLACEMENT N/A 02/21/2017   Procedure: REDO AORTIC VALVE REPLACEMENT (AVR) implanted with [employer]ics Supra- Annular (Top Hat) size 21;  Surgeon: Rexene Alberts, MD;  Location: [place];  Service: Open Heart Surgery;  Laterality: N/A;  . CARDIAC CATHETERIZATION N/A 01/04/2017   Procedure: Right/Left Heart Cath and Coronary Angiography;  Surgeon: Belva Crome, MD;  Location: [place] CV LAB;  Service: Cardiovascular;  Laterality: N/A;  . CORONARY ARTERY BYPASS GRAFT N/A 02/21/2017   Procedure: CORONARY ARTERY BYPASS GRAFTING (CABG) x3 with left internal mammary artery and left greater saphenous vein harvested endoscopically;  Surgeon: Rexene Alberts, MD;  Location: [place];  Service: Open Heart Surgery;  Laterality:  N/A;  . INGUINAL HERNIA REPAIR     Left  . [place]   right side  . LEFT AND RIGHT HEART CATHETERIZATION WITH CORONARY ANGIOGRAM N/A 09/19/2013   Procedure: LEFT AND RIGHT HEART CATHETERIZATION WITH CORONARY ANGIOGRAM;  Surgeon: Josue Hector, MD;  Location: [facility] CATH LAB;  Service: Cardiovascular;  Laterality: N/A;  . TEE WITHOUT CARDIOVERSION N/A 09/19/2013   Procedure: TRANSESOPHAGEAL ECHOCARDIOGRAM (TEE);  Surgeon: Josue Hector, MD;  Location: [place];  Service: Cardiovascular;  Laterality: N/A;  . TEE WITHOUT CARDIOVERSION N/A 02/21/2017   Procedure: TRANSESOPHAGEAL ECHOCARDIOGRAM (TEE);  Surgeon: Rexene Alberts, MD;  Location: [place];  Service: Open Heart Surgery;  Laterality: N/A;  . TOOTH EXTRACTION Left August 2014   left bottom molar     Current Outpatient Medications  Medication Sig Dispense Refill  . amoxicillin (AMOXIL) 500 MG tablet Take 2,000 mg by mouth once as needed (takes prior to dental surgeries).     Marland Kitchen aspirin EC 81 MG tablet Take 1 tablet (81 mg total) by mouth daily. 90 tablet 3  . carvedilol (COREG) 3.125 MG tablet TAKE 1 TABLET (3.125 MG TOTAL) BY MOUTH 2 (TWO) TIMES DAILY WITH A MEAL. 180 tablet 0  . ciprofloxacin (CIPRO) 500 MG tablet Take 500 mg by mouth 2 (two) times daily.    Marland Kitchen losartan (COZAAR) 25 MG tablet Take 1 tablet (25 mg total) daily by mouth. 90 tablet 3  . Multiple Vitamin (MULTIVITAMIN) tablet Take 1 tablet by mouth daily.    Marland Kitchen warfarin (COUMADIN) 7.5 MG tablet Take as directed by Coumadin Clinic 30 tablet 3   No current facility-administered medications for this visit.     Allergies:   No known allergies    Social History:  The patient  reports that he has never smoked. He has never used smokeless tobacco. He reports that he drinks about 0.6 oz of alcohol per week. He reports that he does not use drugs.   Family History:  The patient's family history includes Hypertension in his father and mother.    ROS:  Please  see the history of present illness.   Otherwise, review of systems are positive for none.   All other systems are reviewed and negative.    PHYSICAL EXAM: VS:  BP 122/78   Pulse 79   Ht 6' (1.829 m)   Wt 180 lb 12 oz (82 kg)   SpO2 98%   BMI 24.51 kg/m  , BMI Body mass index is 24.51 kg/m. Affect appropriate Healthy:  appears stated age 75: normal Neck supple with no adenopathy JVP normal no bruits no thyromegaly Lungs clear with no wheezing and good diaphragmatic motion Heart:  Y6/R4 click SEM no AR  murmur, no rub, gallop or click PMI normal Abdomen: benighn, BS positve, no tenderness, no AAA no bruit.  No HSM or HJR Distal pulses intact with no bruits No edema Neuro  non-focal Skin warm and dry No muscular weakness     EKG:  SR rate 80 septal infarct lateral T wave changes 03/14/17 04/30/18 SR rate 60 LVH no acute changes    Recent Labs: No results found for requested labs within last 8760 hours.    Lipid Panel No results found for: CHOL, TRIG, HDL, CHOLHDL, VLDL, LDLCALC, LDLDIRECT    Wt Readings from Last 3 Encounters:  07/20/18 180 lb 12 oz (82 kg)  04/30/18 189 lb 1.9 oz (85.8 kg)  02/26/18 189 lb 12.8 oz (86.1 kg)      Other studies Reviewed: Additional studies/ records that were reviewed today include: TEE .02/21/17 Operative report Dr Roxy Manns F/U echo June 2018    ASSESSMENT AND PLAN:  1.  Redo AVR :normal valve clicks baseline gradient elevated and still up with low EF DVI Also abnormal at .25 Previously declined TEE/Fluoro/ Now with low grade fevers He declines recommended TEE again at this time but will consider if fevers return off cipro 2. CABG emergent due to low coronary ostia SVG harvest sights A Does have a skeletal Deformity form previous right thoracotomy with visible lung protruding between ribs CT done 05/10/17 Indicated non fusion of sternum with "fracture fragments in area Dr Roxy Manns indicated stable.  3. Anticoagulation sometimes subRx  has salads from time to time f/u coumadin clinic  4. Depression  Continue Celexa has separated from wife about a year ago Work going well "She was on a different spiritual path" 5. CHF: EF 30% by echo 04/25/18  Discussed need to optimize medical Rx and AICD.  Declines entresto And AICD at this time    Current medicines are reviewed at length with the patient today.  The patient does not have concerns regarding medicines.  The following changes have been made:  no change  Labs/ tests ordered today include:  THS/T4 and CXR   No orders of the defined types were placed in this encounter.    Disposition:   FU with me in 6 months      Signed, Jenkins Rouge, MD  07/20/2018 10:59 AM    [facility] [place], [place], [place]  [address] Phone: [phone]; Fax: [phone]

## 2018-07-15 ENCOUNTER — Other Ambulatory Visit: Payer: Self-pay | Admitting: Cardiovascular Disease

## 2018-07-18 ENCOUNTER — Telehealth: Payer: Self-pay | Admitting: Cardiovascular Disease

## 2018-07-18 NOTE — Telephone Encounter (Signed)
New Message ° ° ° ° ° ° ° ° ° °Patient returned your call °

## 2018-07-18 NOTE — Telephone Encounter (Signed)
Called patient back with lab work results. Patient verbalized understanding.

## 2018-07-19 LAB — CULTURE, BLOOD (SINGLE)

## 2018-07-20 ENCOUNTER — Ambulatory Visit (INDEPENDENT_AMBULATORY_CARE_PROVIDER_SITE_OTHER): Payer: BLUE CROSS/BLUE SHIELD | Admitting: Cardiovascular Disease

## 2018-07-20 ENCOUNTER — Ambulatory Visit
Admission: RE | Admit: 2018-07-20 | Discharge: 2018-07-20 | Disposition: A | Discharge status: Home / Self Care | Payer: BLUE CROSS/BLUE SHIELD | Source: Ambulatory Visit | Attending: Cardiovascular Disease | Admitting: Cardiovascular Disease

## 2018-07-20 ENCOUNTER — Encounter: Payer: Self-pay | Admitting: Cardiovascular Disease

## 2018-07-20 ENCOUNTER — Ambulatory Visit: Payer: BLUE CROSS/BLUE SHIELD | Admitting: Cardiovascular Disease

## 2018-07-20 VITALS — BP 122/78 | HR 79 | Ht 72.0 in | Wt 180.8 lb

## 2018-07-20 DIAGNOSIS — Z951 Presence of aortocoronary bypass graft: Secondary | ICD-10-CM

## 2018-07-20 DIAGNOSIS — I509 Heart failure, unspecified: Secondary | ICD-10-CM | POA: Diagnosis not present

## 2018-07-20 DIAGNOSIS — R002 Palpitations: Secondary | ICD-10-CM | POA: Diagnosis not present

## 2018-07-20 DIAGNOSIS — R509 Fever, unspecified: Secondary | ICD-10-CM

## 2018-07-20 DIAGNOSIS — Z952 Presence of prosthetic heart valve: Secondary | ICD-10-CM | POA: Diagnosis not present

## 2018-07-20 LAB — T4, FREE: Free T4: 1.27 ng/dL (ref 0.82–1.77)

## 2018-07-20 LAB — TSH: TSH: 0.903 u[IU]/mL (ref 0.450–4.500)

## 2018-07-20 NOTE — Patient Instructions (Addendum)
Medication Instructions:  Your physician recommends that you continue on your current medications as directed. Please refer to the Current Medication list given to you today.  Labwork: Your physician recommends that you have lab work today- TSH, T4  Testing/Procedures: A chest x-ray takes a picture of the organs and structures inside the chest, including the heart, lungs, and blood vessels. This test can show several things, including, whether the heart is enlarges; whether fluid is building up in the lungs; and whether pacemaker / defibrillator leads are still in place. Go to [facility] at [address]  Follow-Up: Your physician wants you to follow-up in: 3 months with Dr. Eden Emms.   If you need a refill on your cardiac medications before your next appointment, please call your pharmacy.

## 2018-07-23 ENCOUNTER — Ambulatory Visit (INDEPENDENT_AMBULATORY_CARE_PROVIDER_SITE_OTHER): Payer: BLUE CROSS/BLUE SHIELD

## 2018-07-23 DIAGNOSIS — Z951 Presence of aortocoronary bypass graft: Secondary | ICD-10-CM

## 2018-07-23 DIAGNOSIS — Z5181 Encounter for therapeutic drug level monitoring: Secondary | ICD-10-CM

## 2018-07-23 DIAGNOSIS — Z954 Presence of other heart-valve replacement: Secondary | ICD-10-CM | POA: Diagnosis not present

## 2018-07-23 DIAGNOSIS — Z952 Presence of prosthetic heart valve: Secondary | ICD-10-CM | POA: Diagnosis not present

## 2018-07-23 LAB — POCT INR: INR: 2.4 (ref 2.0–3.0)

## 2018-07-23 NOTE — Patient Instructions (Signed)
Description   Continue taking 1 tablet (7.5mg ) daily except 1/2 tablet (3.75mg ) on Mondays, Wednesdays,and Fridays.  Recheck INR in 4 weeks.

## 2018-08-03 ENCOUNTER — Other Ambulatory Visit: Payer: Self-pay | Admitting: Cardiovascular Disease

## 2018-08-07 ENCOUNTER — Encounter: Payer: Self-pay | Admitting: Internal Medicine

## 2018-08-07 ENCOUNTER — Ambulatory Visit (INDEPENDENT_AMBULATORY_CARE_PROVIDER_SITE_OTHER): Payer: BLUE CROSS/BLUE SHIELD | Admitting: Internal Medicine

## 2018-08-07 DIAGNOSIS — R509 Fever, unspecified: Secondary | ICD-10-CM | POA: Insufficient documentation

## 2018-08-07 NOTE — Assessment & Plan Note (Signed)
It appears that his recent febrile illness has resolved, either as a result of empiric ciprofloxacin or time.  I do not find any evidence of active infection on exam today.  The cause of his recent illness remains unclear.  I discussed management options with him and we agreed that since he is doing well now that we will not pursue any further diagnostic testing or further antibiotic treatment.  I did instruct him to contact me immediately if he has any recurrent fever or starts to feel like he is getting sick again.  I would recommend repeating blood cultures off of antibiotics if that occurred.

## 2018-08-07 NOTE — Progress Notes (Signed)
[facility]  Reason for Consult: Recent unexplained fever Referring Provider: Dr. Lupe Carney  Assessment: It appears that his recent febrile illness has resolved, either as a result of empiric ciprofloxacin or time.  I do not find any evidence of active infection on exam today.  The cause of his recent illness remains unclear.  I discussed management options with him and we agreed that since he is doing well now that we will not pursue any further diagnostic testing or further antibiotic treatment.  I did instruct him to contact me immediately if he has any recurrent fever or starts to feel like he is getting sick again.  I would recommend repeating blood cultures off of antibiotics if that occurred.   Plan: 1. Observe off of antibiotics for now.  Patient Active Problem List   Diagnosis Date Noted  . Fever 08/07/2018    Priority: High  . Encounter for therapeutic drug monitoring 03/01/2017  . Thrombocytopenia (HCC)   . S/P redo aortic valve replacement with bileaflet mechanical valve and CABG x3 02/21/2017  . S/P CABG x 3 02/21/2017  . Prosthetic valve dysfunction   . Aortic stenosis 09/23/2013  . Aortic insufficiency   . S/P AVR (aortic valve replacement) 08/30/2012  . Inguinal hernia without mention of obstruction or gangrene, unilateral or unspecified, (not specified as recurrent) 10/11/2011  . Combined systolic and diastolic congestive heart failure (HCC) 08/23/2011  . HOARSENESS 12/31/2009  . ROSACEA 08/26/2009    Patient's Medications  New Prescriptions   No medications on file  Previous Medications   AMOXICILLIN (AMOXIL) 500 MG TABLET    Take 2,000 mg by mouth once as needed (takes prior to dental surgeries).    ASPIRIN EC 81 MG TABLET    Take 1 tablet (81 mg total) by mouth daily.   CARVEDILOL (COREG) 3.125 MG TABLET    TAKE 1 TABLET (3.125 MG TOTAL) BY MOUTH 2 (TWO) TIMES DAILY WITH A MEAL.   CIPROFLOXACIN (CIPRO) 500 MG TABLET     Take 500 mg by mouth 2 (two) times daily.   LOSARTAN (COZAAR) 25 MG TABLET    Take 1 tablet (25 mg total) daily by mouth.   MULTIPLE VITAMIN (MULTIVITAMIN) TABLET    Take 1 tablet by mouth daily.   WARFARIN (COUMADIN) 7.5 MG TABLET    TAKE AS DIRECTED BY COUMADIN CLINIC  Modified Medications   No medications on file  Discontinued Medications   No medications on file    HPI: [name] is a 62 y.o. 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  He says that he developed a head cold in early June.  He began to have low-grade fevers on a daily basis associated with headache and malaise.  He also noted some aching in both knees.  He felt well enough to go on a 30 mile hiking trip in [place] at the end of June.  When he returned home he saw Dr. Clovis Riley on 06/29/2018.  He was still having intermittent fevers up to 101 degrees and a mild dry cough.  He was prescribed symptomatic therapy for his cough.  He did not feel any better and was started on empiric ciprofloxacin on 07/06/2018.  He took it for 10 days.  He had a temperature of 101 degrees again on 07/16/2018, the day he completed ciprofloxacin.  His fever has been down since that time and he has  felt well for the past week without any fever.  He has resumed all of his usual activities.  He has a history of congenital bicuspid aortic valve.  He underwent initial aortic valve replacement in 2010 and redo aortic valve replacement and CABG in February 2018.  Because of his recent fevers he had blood cultures obtained on 07/13/2018 (while on ciprofloxacin).  These were negative.  His CBC was normal.  Chest x-ray showed no active disease and there is no evidence of endocarditis on a transthoracic echocardiogram on 07/13/2018.  His sedimentation rate was slightly above normal at 33.  Review of Systems: Review of Systems  Constitutional: Positive for chills, fever, malaise/fatigue and weight loss. Negative for  diaphoresis.  HENT: Negative for congestion and sore throat.   Respiratory: Positive for cough. Negative for sputum production and shortness of breath.   Cardiovascular: Negative for chest pain.  Gastrointestinal: Negative for abdominal pain, diarrhea, heartburn, nausea and vomiting.  Genitourinary: Negative for dysuria and frequency.       Nocturia.  Musculoskeletal: Positive for joint pain. Negative for myalgias.  Skin: Negative for rash.  Neurological: Positive for headaches. Negative for dizziness.      Past Medical History:  Diagnosis Date  . Aortic insufficiency   . Aortic valve disease    a. Biscuspid AVR with heavy annular calcification s/p tissue AVR (pt preferred). b. Mod-severe perivalvular regurg by cath 08/2013, mod by TEE 08/2013, gradients lower by TEE than by echo.   . Complication of anesthesia    when intubated  damage to vocal after surgery  2010  . Depression   . Frequent urination   . Hearing loss    bilateral, pt wears hearing aids   . Nonischemic cardiomyopathy (HCC)    a. EF normal immediately after AVR, then decreased to 40-45% by echo & 34% by MRI 2011. b. Most recent EF 40-45% by echo/TEE 08/2013.  . Paravalvular leak of prosthetic heart valve   . Prosthetic valve dysfunction   . Reflux   . Rosacea   . S/P aortic valve replacement with bioprosthetic valve 12/08/2009   21mm Edwards [facility] Ease bovine pericardial tissue valve placed via right mini thoracotomy approach  . 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 Shortness of breath    with exertion  . Thrombocytopenia (HCC)     Social History   Tobacco Use  . Smoking status: Never Smoker  . Smokeless tobacco: Never Used  Substance Use Topics  . Alcohol use: Yes    Alcohol/week: 1.0 standard drinks    Types: 1 Cans of  beer per week    Comment: occasional  . Drug use: No    Family History  Problem Relation Age of Onset  . Hypertension Mother   . Hypertension Father    Allergies  Allergen Reactions  . No Known Allergies     OBJECTIVE: Vitals:   08/07/18 1354  BP: (!) 147/67  Pulse: 71  Temp: 99.2 F (37.3 C)  Weight: 180 lb 6.4 oz (81.8 kg)   Body mass index is 24.47 kg/m.   Physical Exam  Constitutional: He is oriented to person, place, and time.  He is pleasant and in no distress.  He is accompanied by his girlfriend.  HENT:  Mouth/Throat: No oropharyngeal  exudate.  Eyes: Conjunctivae are normal.  Cardiovascular: Normal rate and regular rhythm.  Murmur heard. 1/6 to 2/6 systolic ejection murmur.  No diastolic murmur.  Pulmonary/Chest: Effort normal and breath sounds normal. He has no wheezes. He has no rales.  Abdominal: Soft. He exhibits no mass. There is no tenderness.  Musculoskeletal: Normal range of motion. He exhibits no edema or tenderness.  Lymphadenopathy:       Head (right side): No submandibular adenopathy present.       Head (left side): No submandibular adenopathy present.    He has no cervical adenopathy.    He has no axillary adenopathy.  Neurological: He is alert and oriented to person, place, and time.  Skin: No rash noted.  He has a small sebaceous cyst on the left side of his neck posteriorly.  There is no sign of infection.  Psychiatric: He has a normal mood and affect.    Microbiology: No results found for this or any previous visit (from the past 240 hour(s)).  Cliffton Asters, MD [facility] Infectious [facility] [phone] pager   [phone] cell 08/07/2018, 3:57 PM

## 2018-08-22 ENCOUNTER — Ambulatory Visit (INDEPENDENT_AMBULATORY_CARE_PROVIDER_SITE_OTHER): Payer: BLUE CROSS/BLUE SHIELD | Admitting: *Deleted

## 2018-08-22 DIAGNOSIS — Z952 Presence of prosthetic heart valve: Secondary | ICD-10-CM | POA: Diagnosis not present

## 2018-08-22 DIAGNOSIS — Z954 Presence of other heart-valve replacement: Secondary | ICD-10-CM

## 2018-08-22 DIAGNOSIS — Z951 Presence of aortocoronary bypass graft: Secondary | ICD-10-CM | POA: Diagnosis not present

## 2018-08-22 DIAGNOSIS — Z5181 Encounter for therapeutic drug level monitoring: Secondary | ICD-10-CM

## 2018-08-22 LAB — POCT INR: INR: 2.9 (ref 2.0–3.0)

## 2018-08-22 NOTE — Patient Instructions (Signed)
Description   Continue taking 1 tablet (7.5mg) daily except 1/2 tablet (3.75mg) on Mondays, Wednesdays,and Fridays.  Recheck INR in 6 weeks.       

## 2018-09-18 DIAGNOSIS — Z125 Encounter for screening for malignant neoplasm of prostate: Secondary | ICD-10-CM | POA: Diagnosis not present

## 2018-09-18 DIAGNOSIS — Z23 Encounter for immunization: Secondary | ICD-10-CM | POA: Diagnosis not present

## 2018-09-18 DIAGNOSIS — B078 Other viral warts: Secondary | ICD-10-CM | POA: Diagnosis not present

## 2018-09-18 DIAGNOSIS — Z0000 Encounter for general adult medical examination without abnormal findings: Secondary | ICD-10-CM | POA: Diagnosis not present

## 2018-09-18 DIAGNOSIS — Z1159 Encounter for screening for other viral diseases: Secondary | ICD-10-CM | POA: Diagnosis not present

## 2018-09-18 DIAGNOSIS — I2510 Atherosclerotic heart disease of native coronary artery without angina pectoris: Secondary | ICD-10-CM | POA: Diagnosis not present

## 2018-10-03 ENCOUNTER — Ambulatory Visit (INDEPENDENT_AMBULATORY_CARE_PROVIDER_SITE_OTHER): Payer: BLUE CROSS/BLUE SHIELD | Admitting: *Deleted

## 2018-10-03 DIAGNOSIS — Z954 Presence of other heart-valve replacement: Secondary | ICD-10-CM

## 2018-10-03 DIAGNOSIS — Z5181 Encounter for therapeutic drug level monitoring: Secondary | ICD-10-CM | POA: Diagnosis not present

## 2018-10-03 DIAGNOSIS — Z951 Presence of aortocoronary bypass graft: Secondary | ICD-10-CM | POA: Diagnosis not present

## 2018-10-03 DIAGNOSIS — Z952 Presence of prosthetic heart valve: Secondary | ICD-10-CM | POA: Diagnosis not present

## 2018-10-03 LAB — POCT INR: INR: 3 (ref 2.0–3.0)

## 2018-10-03 NOTE — Patient Instructions (Signed)
Description   Continue taking 1 tablet (7.5mg ) daily except 1/2 tablet (3.75mg ) on Mondays, Wednesdays,and Fridays.  Recheck INR in 6 weeks.

## 2018-10-09 DIAGNOSIS — Z01818 Encounter for other preprocedural examination: Secondary | ICD-10-CM | POA: Diagnosis not present

## 2018-10-14 ENCOUNTER — Other Ambulatory Visit: Payer: Self-pay | Admitting: Cardiovascular Disease

## 2018-10-16 ENCOUNTER — Other Ambulatory Visit: Payer: Self-pay | Admitting: Family Medicine

## 2018-10-16 DIAGNOSIS — L720 Epidermal cyst: Secondary | ICD-10-CM | POA: Diagnosis not present

## 2018-10-16 DIAGNOSIS — Z23 Encounter for immunization: Secondary | ICD-10-CM | POA: Diagnosis not present

## 2018-10-16 DIAGNOSIS — L723 Sebaceous cyst: Secondary | ICD-10-CM | POA: Diagnosis not present

## 2018-10-17 ENCOUNTER — Other Ambulatory Visit: Payer: Self-pay | Admitting: Cardiovascular Disease

## 2018-10-17 DIAGNOSIS — Z1212 Encounter for screening for malignant neoplasm of rectum: Secondary | ICD-10-CM | POA: Diagnosis not present

## 2018-10-17 DIAGNOSIS — Z1211 Encounter for screening for malignant neoplasm of colon: Secondary | ICD-10-CM | POA: Diagnosis not present

## 2018-10-25 ENCOUNTER — Ambulatory Visit: Payer: BLUE CROSS/BLUE SHIELD | Admitting: Cardiovascular Disease

## 2018-11-14 ENCOUNTER — Ambulatory Visit (INDEPENDENT_AMBULATORY_CARE_PROVIDER_SITE_OTHER): Payer: BLUE CROSS/BLUE SHIELD | Admitting: *Deleted

## 2018-11-14 DIAGNOSIS — Z951 Presence of aortocoronary bypass graft: Secondary | ICD-10-CM

## 2018-11-14 DIAGNOSIS — Z5181 Encounter for therapeutic drug level monitoring: Secondary | ICD-10-CM | POA: Diagnosis not present

## 2018-11-14 DIAGNOSIS — Z954 Presence of other heart-valve replacement: Secondary | ICD-10-CM

## 2018-11-14 DIAGNOSIS — Z952 Presence of prosthetic heart valve: Secondary | ICD-10-CM

## 2018-11-14 LAB — POCT INR: INR: 3.3 — AB (ref 2.0–3.0)

## 2018-11-14 NOTE — Patient Instructions (Signed)
Description   Skip today's dose, then Continue taking 1 tablet (7.5mg ) daily except 1/2 tablet (3.75mg ) on Mondays, Wednesdays,and Fridays.  Recheck INR in 4 weeks.

## 2018-12-12 ENCOUNTER — Ambulatory Visit (INDEPENDENT_AMBULATORY_CARE_PROVIDER_SITE_OTHER): Payer: BLUE CROSS/BLUE SHIELD | Admitting: *Deleted

## 2018-12-12 ENCOUNTER — Other Ambulatory Visit: Payer: Self-pay | Admitting: Dermatology

## 2018-12-12 DIAGNOSIS — D485 Neoplasm of uncertain behavior of skin: Secondary | ICD-10-CM | POA: Diagnosis not present

## 2018-12-12 DIAGNOSIS — Z954 Presence of other heart-valve replacement: Secondary | ICD-10-CM

## 2018-12-12 DIAGNOSIS — Z5181 Encounter for therapeutic drug level monitoring: Secondary | ICD-10-CM | POA: Diagnosis not present

## 2018-12-12 DIAGNOSIS — Z952 Presence of prosthetic heart valve: Secondary | ICD-10-CM

## 2018-12-12 DIAGNOSIS — Z951 Presence of aortocoronary bypass graft: Secondary | ICD-10-CM | POA: Diagnosis not present

## 2018-12-12 DIAGNOSIS — L820 Inflamed seborrheic keratosis: Secondary | ICD-10-CM | POA: Diagnosis not present

## 2018-12-12 LAB — POCT INR: INR: 2.5 (ref 2.0–3.0)

## 2018-12-12 NOTE — Patient Instructions (Signed)
Description    Continue taking 1 tablet (7.5mg ) daily except 1/2 tablet (3.75mg ) on Mondays, Wednesdays,and Fridays.  Recheck INR in 5 weeks.

## 2019-01-01 DIAGNOSIS — Z23 Encounter for immunization: Secondary | ICD-10-CM | POA: Diagnosis not present

## 2019-01-16 ENCOUNTER — Ambulatory Visit (INDEPENDENT_AMBULATORY_CARE_PROVIDER_SITE_OTHER): Payer: BLUE CROSS/BLUE SHIELD | Admitting: *Deleted

## 2019-01-16 DIAGNOSIS — Z951 Presence of aortocoronary bypass graft: Secondary | ICD-10-CM

## 2019-01-16 DIAGNOSIS — Z952 Presence of prosthetic heart valve: Secondary | ICD-10-CM

## 2019-01-16 DIAGNOSIS — Z954 Presence of other heart-valve replacement: Secondary | ICD-10-CM | POA: Diagnosis not present

## 2019-01-16 DIAGNOSIS — Z5181 Encounter for therapeutic drug level monitoring: Secondary | ICD-10-CM | POA: Diagnosis not present

## 2019-01-16 LAB — POCT INR: INR: 1.8 — AB (ref 2.0–3.0)

## 2019-01-16 NOTE — Patient Instructions (Signed)
Description   Tonight take 1 tablet then continue taking 1 tablet (7.5mg ) daily except 1/2 tablet (3.75mg ) on Mondays, Wednesdays,and Fridays.  Recheck INR in 4 weeks.

## 2019-02-08 DIAGNOSIS — M25511 Pain in right shoulder: Secondary | ICD-10-CM | POA: Diagnosis not present

## 2019-02-13 ENCOUNTER — Ambulatory Visit (INDEPENDENT_AMBULATORY_CARE_PROVIDER_SITE_OTHER): Payer: BLUE CROSS/BLUE SHIELD | Admitting: *Deleted

## 2019-02-13 ENCOUNTER — Telehealth: Payer: Self-pay | Admitting: Cardiovascular Disease

## 2019-02-13 ENCOUNTER — Other Ambulatory Visit: Payer: Self-pay

## 2019-02-13 DIAGNOSIS — Z951 Presence of aortocoronary bypass graft: Secondary | ICD-10-CM

## 2019-02-13 DIAGNOSIS — Z954 Presence of other heart-valve replacement: Secondary | ICD-10-CM

## 2019-02-13 DIAGNOSIS — Z952 Presence of prosthetic heart valve: Secondary | ICD-10-CM

## 2019-02-13 DIAGNOSIS — Z5181 Encounter for therapeutic drug level monitoring: Secondary | ICD-10-CM | POA: Diagnosis not present

## 2019-02-13 LAB — POCT INR: INR: 2.4 (ref 2.0–3.0)

## 2019-02-13 NOTE — Telephone Encounter (Signed)
Talked to patient in the office. Patient will have his echo and OV same day on 04/02/19.

## 2019-02-13 NOTE — Progress Notes (Unsigned)
This encounter was created in error - please disregard.

## 2019-02-13 NOTE — Telephone Encounter (Signed)
New message    Pt got his recall letter, he states that he normally has an echo the same day as his yearly f/u appt. Does pt need to have one this year? Please call.

## 2019-02-13 NOTE — Patient Instructions (Signed)
Description   Continue taking 1 tablet (7.5mg ) daily except 1/2 tablet (3.75mg ) on Mondays, Wednesdays,and Fridays.  Recheck INR in 4 weeks. [facility] [phone]

## 2019-02-15 DIAGNOSIS — M65811 Other synovitis and tenosynovitis, right shoulder: Secondary | ICD-10-CM | POA: Diagnosis not present

## 2019-02-18 DIAGNOSIS — M65811 Other synovitis and tenosynovitis, right shoulder: Secondary | ICD-10-CM | POA: Diagnosis not present

## 2019-02-21 DIAGNOSIS — M65811 Other synovitis and tenosynovitis, right shoulder: Secondary | ICD-10-CM | POA: Diagnosis not present

## 2019-02-28 DIAGNOSIS — M65811 Other synovitis and tenosynovitis, right shoulder: Secondary | ICD-10-CM | POA: Diagnosis not present

## 2019-03-08 DIAGNOSIS — M65811 Other synovitis and tenosynovitis, right shoulder: Secondary | ICD-10-CM | POA: Diagnosis not present

## 2019-03-12 DIAGNOSIS — H903 Sensorineural hearing loss, bilateral: Secondary | ICD-10-CM | POA: Diagnosis not present

## 2019-03-13 ENCOUNTER — Other Ambulatory Visit: Payer: Self-pay

## 2019-03-13 ENCOUNTER — Ambulatory Visit (INDEPENDENT_AMBULATORY_CARE_PROVIDER_SITE_OTHER): Payer: BLUE CROSS/BLUE SHIELD | Admitting: Pharmacist

## 2019-03-13 DIAGNOSIS — Z954 Presence of other heart-valve replacement: Secondary | ICD-10-CM

## 2019-03-13 DIAGNOSIS — Z952 Presence of prosthetic heart valve: Secondary | ICD-10-CM | POA: Diagnosis not present

## 2019-03-13 DIAGNOSIS — Z951 Presence of aortocoronary bypass graft: Secondary | ICD-10-CM

## 2019-03-13 DIAGNOSIS — Z5181 Encounter for therapeutic drug level monitoring: Secondary | ICD-10-CM | POA: Diagnosis not present

## 2019-03-13 LAB — POCT INR: INR: 3.2 — AB (ref 2.0–3.0)

## 2019-03-13 NOTE — Patient Instructions (Signed)
Description   Skip your warfarin today, then continue taking 1 tablet (7.5mg ) daily except 1/2 tablet (3.75mg ) on Mondays, Wednesdays,and Fridays.  Recheck INR in 3 weeks - same day as visit with Dr Eden Emms. [facility]in Clinic [phone], call if appt with Dr Eden Emms is moved and we can move out your INR check an extra 1-2 weeks.

## 2019-03-22 DIAGNOSIS — M65811 Other synovitis and tenosynovitis, right shoulder: Secondary | ICD-10-CM | POA: Diagnosis not present

## 2019-03-25 ENCOUNTER — Telehealth: Payer: Self-pay | Admitting: Cardiovascular Disease

## 2019-03-25 NOTE — Telephone Encounter (Signed)
   Primary Cardiologist:  [name], MD   Reviewed chart ok to schedule in 6 months with echo Pam call patient  let patient know   Routing to C19 CANCEL pool for tracking (P CV DIV CV19 CANCEL - reason for visit "other.") and assigning priority (1 = 4-6 wks, 2 = 6-12 wks, 3 = >12 wks).   [name], MD  03/25/2019 1:05 PM         .          .

## 2019-03-25 NOTE — Telephone Encounter (Signed)
Called patient. Rescheduled patient to August 19th for office visit and echo. Patient verbalized understanding.

## 2019-04-01 ENCOUNTER — Telehealth: Payer: Self-pay

## 2019-04-01 NOTE — Telephone Encounter (Signed)

## 2019-04-02 ENCOUNTER — Ambulatory Visit (INDEPENDENT_AMBULATORY_CARE_PROVIDER_SITE_OTHER): Payer: BLUE CROSS/BLUE SHIELD | Admitting: *Deleted

## 2019-04-02 ENCOUNTER — Other Ambulatory Visit (HOSPITAL_COMMUNITY): Payer: BLUE CROSS/BLUE SHIELD

## 2019-04-02 ENCOUNTER — Ambulatory Visit: Payer: BLUE CROSS/BLUE SHIELD | Admitting: Cardiovascular Disease

## 2019-04-02 ENCOUNTER — Other Ambulatory Visit: Payer: Self-pay

## 2019-04-02 DIAGNOSIS — Z954 Presence of other heart-valve replacement: Secondary | ICD-10-CM

## 2019-04-02 DIAGNOSIS — Z951 Presence of aortocoronary bypass graft: Secondary | ICD-10-CM | POA: Diagnosis not present

## 2019-04-02 DIAGNOSIS — Z5181 Encounter for therapeutic drug level monitoring: Secondary | ICD-10-CM

## 2019-04-02 DIAGNOSIS — Z952 Presence of prosthetic heart valve: Secondary | ICD-10-CM | POA: Diagnosis not present

## 2019-04-02 LAB — POCT INR: INR: 2.8 (ref 2.0–3.0)

## 2019-04-04 ENCOUNTER — Other Ambulatory Visit: Payer: Self-pay | Admitting: Cardiovascular Disease

## 2019-04-07 IMAGING — CR DG CHEST 2V
2 series · 2 of 2 positions shown · non-contrast
Comparison: 03/20/2017

CLINICAL DATA: Upper anterior chest discomfort with deep breathing.
Out pouching.

EXAM:
CHEST  2 VIEW

[w chest pa]
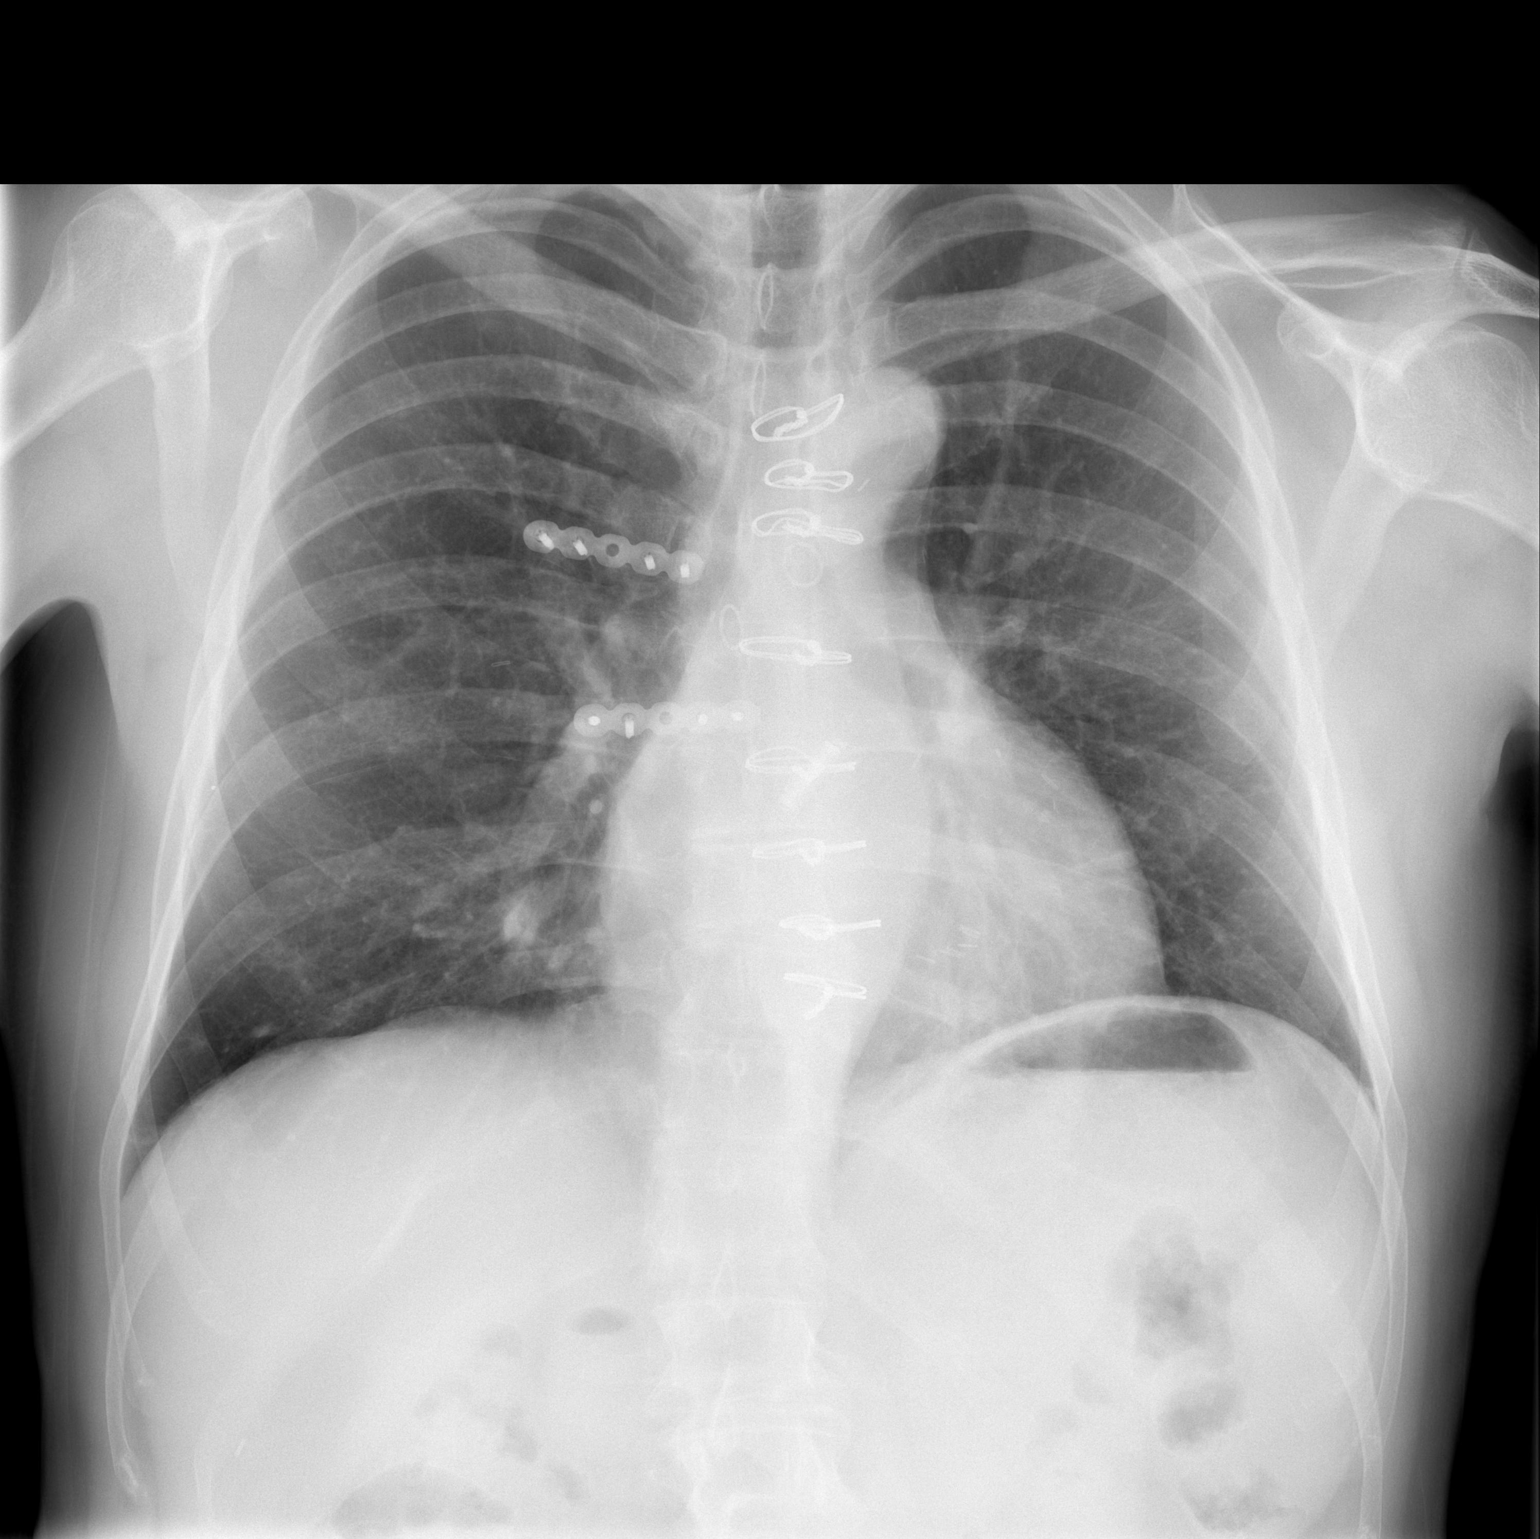

[w chest lat]
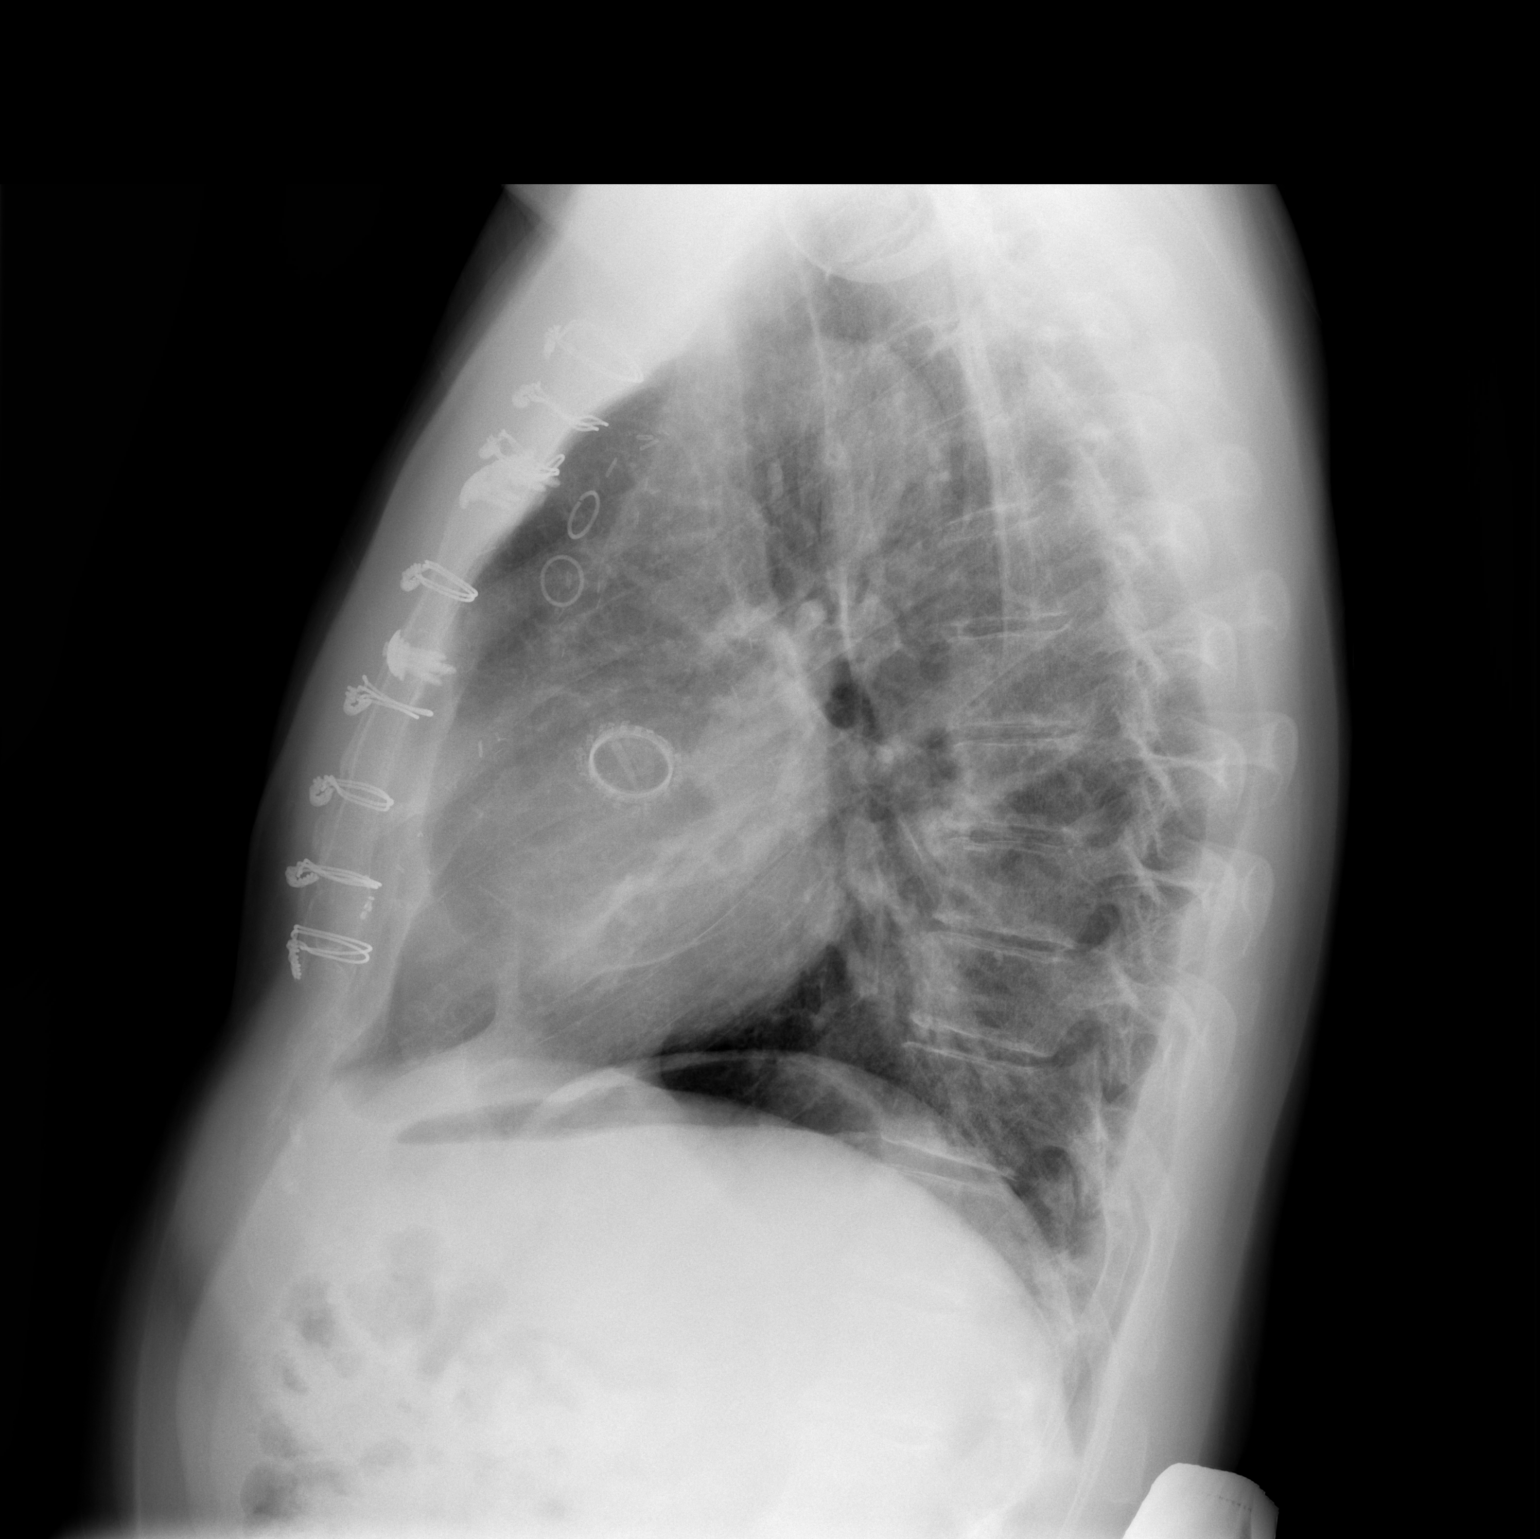

[2 of 2 positions shown; findings below may reference images not displayed]

FINDINGS: Heart size at the upper limits of normal. Atherosclerosis and
tortuosity of the thoracic aorta. The lungs are clear. No
pneumothorax, effusion, infiltrate, collapse or focal lesion. There
has been previous median sternotomy, aortic valve replacement and
CABG. There are 2 plates with screws related to the anterior right
second and third ribs and costosternal articulation regions. No
change or complication seen relative to those compared to the
previous studies. No evidence of any lung herniation.
IMPRESSION: No active disease. No complicating features seen. Previous median
sternotomy. Previous operative fixation of the anterior right second
and third ribs and costosternal junction regions.

## 2019-04-09 IMAGING — CT CT CHEST W/O CM
3 of 4 series · 16 of 30 positions shown, 18 images · non-contrast
Comparison: Chest CT December 30, 2016; chest radiograph May 08, 2017

CLINICAL DATA: Chest pain for several weeks

EXAM:
CT CHEST WITHOUT CONTRAST
TECHNIQUE: Multidetector CT imaging of the chest was performed following the
standard protocol without IV contrast.

[Series 3: chest w/o · axial · non-contrast · 0.73mm/px · z∈[-254,-34]mm · 6 of 124 slices shown]
[im 18/124  lung]
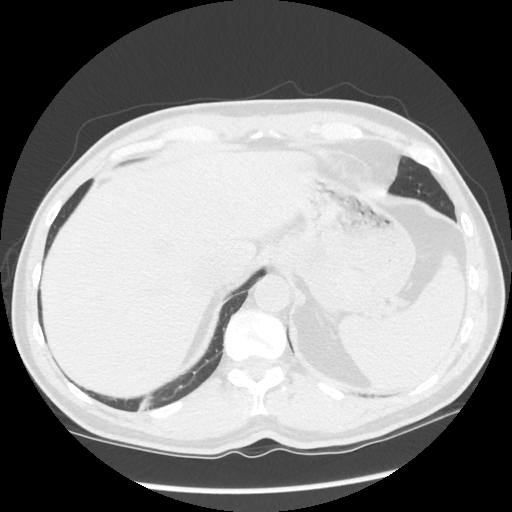
[im 36/124  lung]
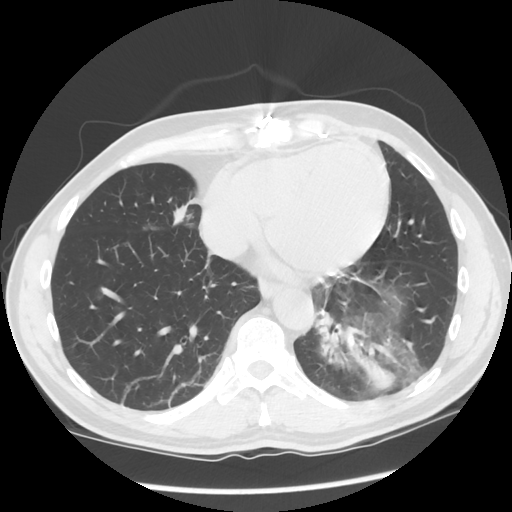
[im 53/124  lung]
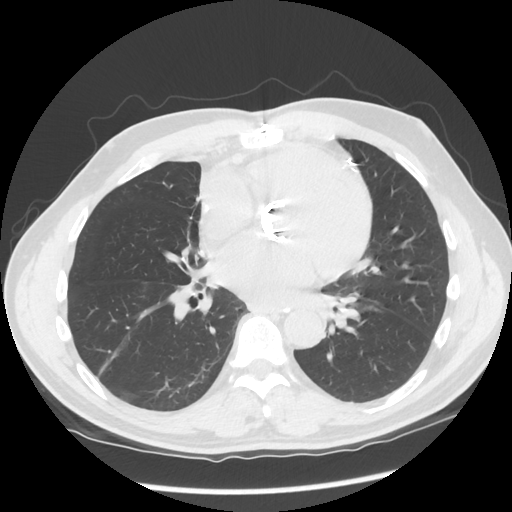
[im 71/124  lung]
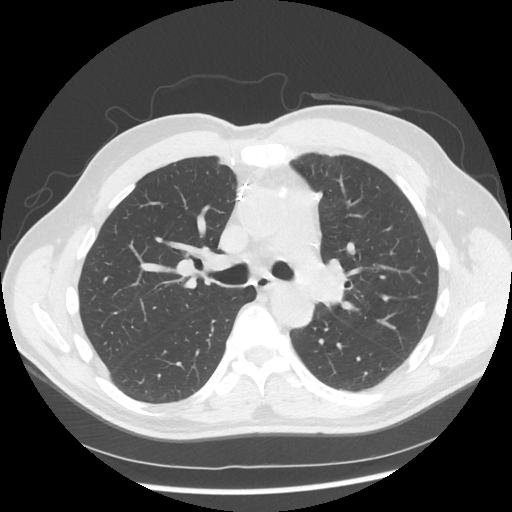
[im 88/124  lung]
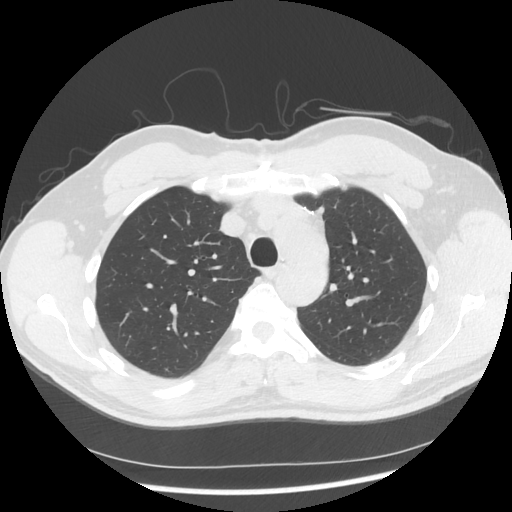
[im 106/124  lung]
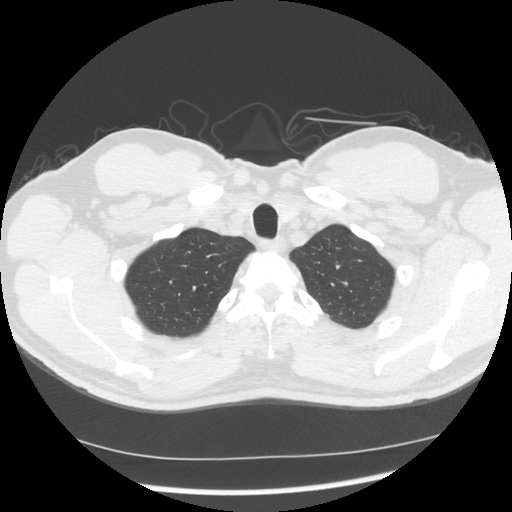

[Series 4: lung windows · axial · 0.73mm/px · z∈[-259,-29]mm · 7 of 124 slices shown, 9 images]
[im 16/124  mediastinal]
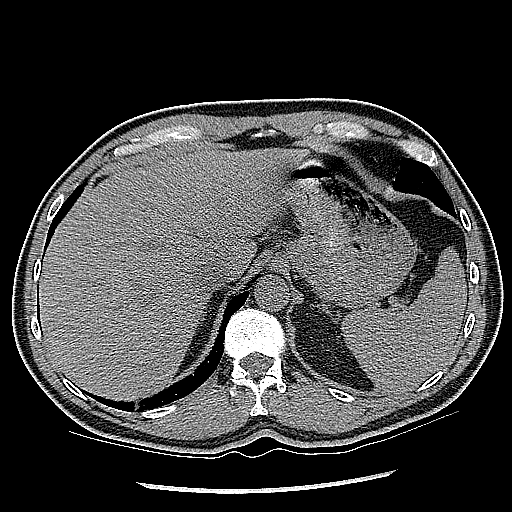
[im 16/124  lung]
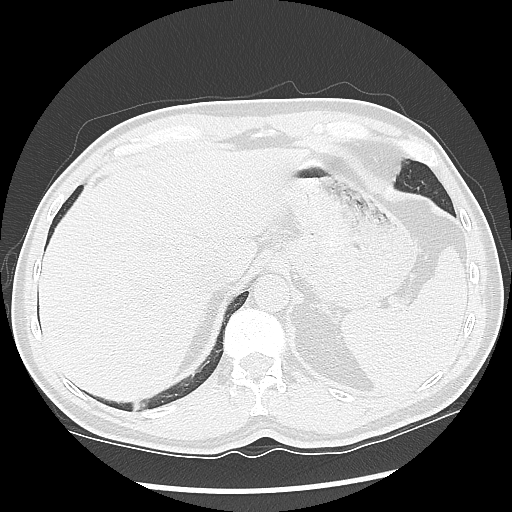
[im 31/124  lung]
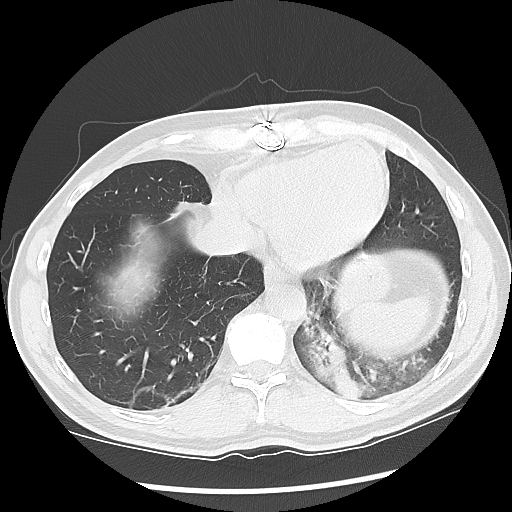
[im 47/124  lung]
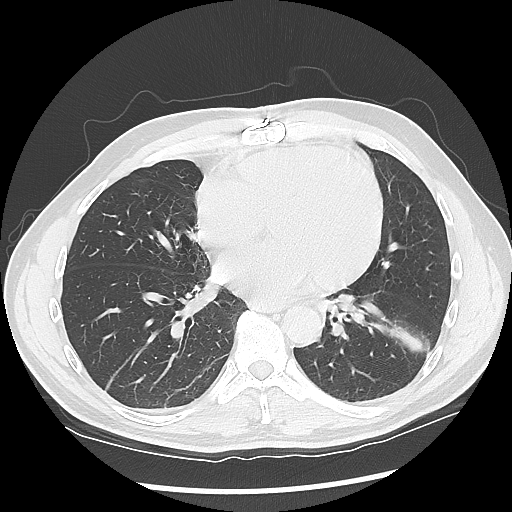
[im 62/124  lung]
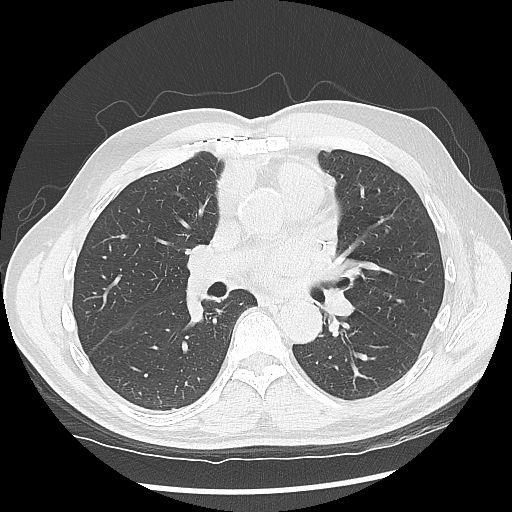
[im 77/124  mediastinal]
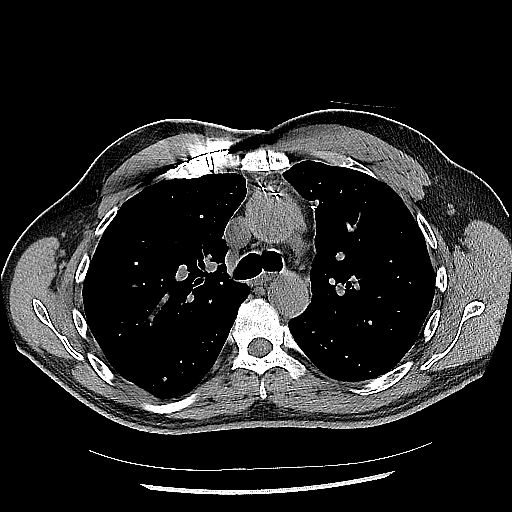
[im 77/124  lung]
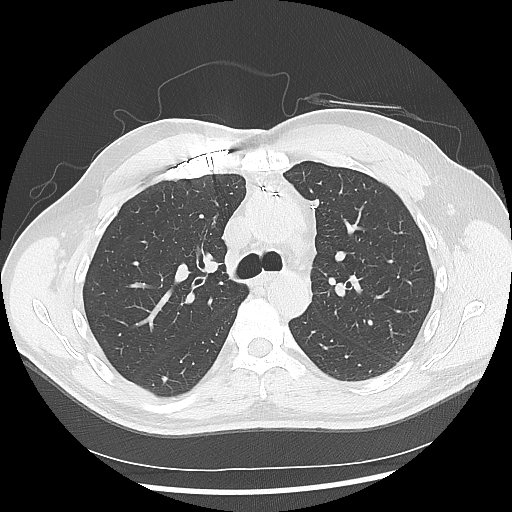
[im 93/124  lung]
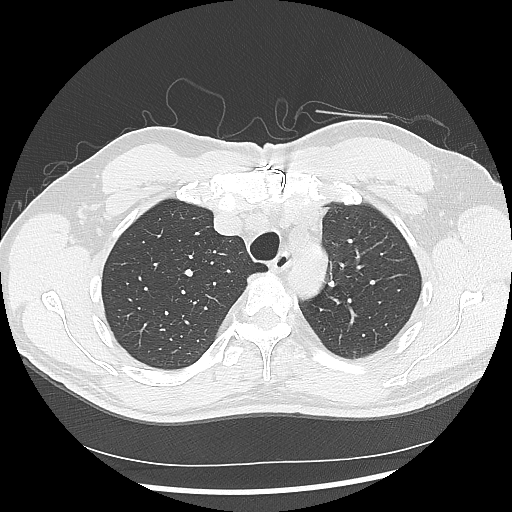
[im 108/124  lung]
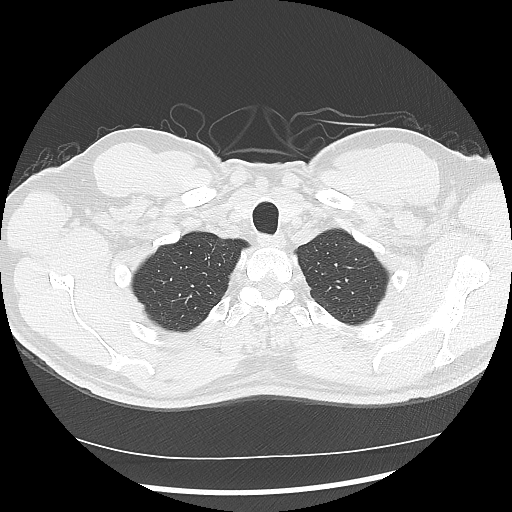

[Series 602: sagittal body · sagittal · 0.73mm/px · 3 of 150 slices shown]
[im 15/150  mediastinal]
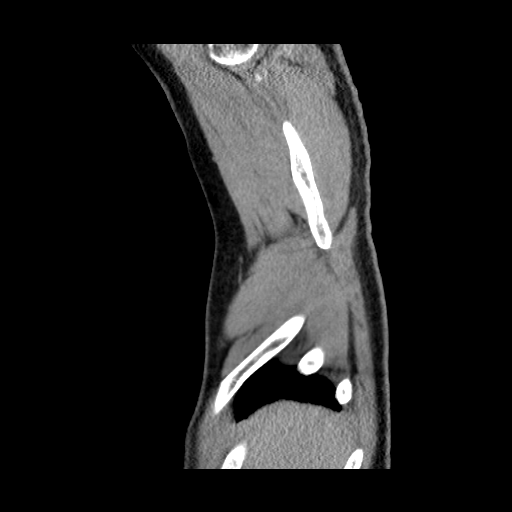
[im 30/150  mediastinal]
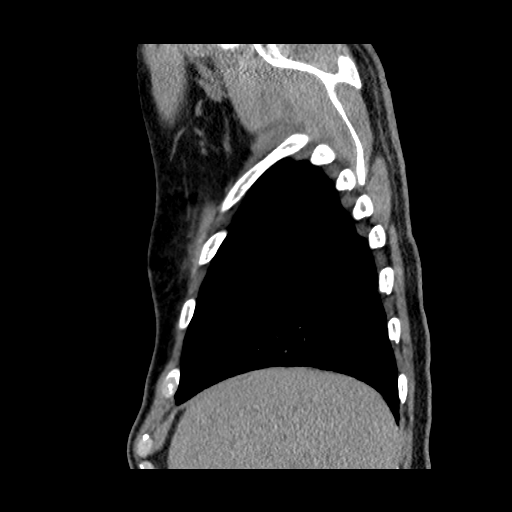
[im 45/150  mediastinal]
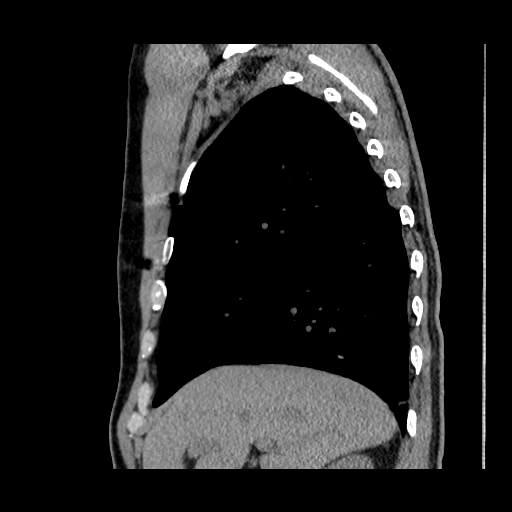

[16 of 30 positions shown; findings below may reference images not displayed]

FINDINGS: Cardiovascular: There is no demonstrable thoracic aortic aneurysm.
Aorta is somewhat tortuous. There are foci of atherosclerotic
calcification in the aorta. Visualized great vessels appear
unremarkable. Patient has had previous aortic valve replacement.
There is evidence of previous coronary artery bypass grafting. Foci
of native coronary artery calcification noted. There is slight
pericardial thickening without well-defined pericardial effusion.

Mediastinum/Nodes: Thyroid appears unremarkable. There is no
appreciable thoracic adenopathy.

Lungs/Pleura: There is focal airspace consolidation in the posterior
segment of the left lower lobe along its medial aspect. There is
also scarring with atelectatic change and left lower lobe. There is
a stable 4 mm nodular opacity in the superior segment of the right
lower lobe seen on axial slice 90 series 4. There is scarring and
atelectasis in the right middle lobe medially, stable. Upper lung
zones are clear. No pleural effusion or pleural thickening.

Upper Abdomen: Visualized upper abdominal structures appear
unremarkable.

Musculoskeletal: Patient is status post median sternotomy. Note that
there is nonfusion of the sternotomy slight with wire surrounding
the mildly separated bony fragments There is surgical fixation of
anterior ribs on the right. No acute appearing fracture. No blastic
or lytic bone lesions.
IMPRESSION: Focal area of consolidation felt to represent a degree of pneumonia
in the medial aspect of the posterior segment of the left lower
lobe. Areas of patchy atelectasis in both lower lung zones as well
as in the medial segment right middle lobe, stable. No pleural
effusion.

Status post median sternotomy with nonfusion of the sternum with
surrounding wire sutures. There is slight separation of fracture
fragments in this area.

Areas of atherosclerotic calcification. Status post coronary artery
bypass grafting and aortic valve replacement.

No evident adenopathy.

## 2019-04-29 ENCOUNTER — Telehealth: Payer: Self-pay

## 2019-04-29 NOTE — Telephone Encounter (Signed)

## 2019-04-30 ENCOUNTER — Other Ambulatory Visit: Payer: Self-pay

## 2019-04-30 ENCOUNTER — Ambulatory Visit (INDEPENDENT_AMBULATORY_CARE_PROVIDER_SITE_OTHER): Payer: BLUE CROSS/BLUE SHIELD | Admitting: *Deleted

## 2019-04-30 DIAGNOSIS — Z952 Presence of prosthetic heart valve: Secondary | ICD-10-CM

## 2019-04-30 DIAGNOSIS — Z5181 Encounter for therapeutic drug level monitoring: Secondary | ICD-10-CM | POA: Diagnosis not present

## 2019-04-30 DIAGNOSIS — Z954 Presence of other heart-valve replacement: Secondary | ICD-10-CM

## 2019-04-30 DIAGNOSIS — Z951 Presence of aortocoronary bypass graft: Secondary | ICD-10-CM

## 2019-04-30 LAB — POCT INR: INR: 3.4 — AB (ref 2.0–3.0)

## 2019-05-13 ENCOUNTER — Telehealth: Payer: Self-pay

## 2019-05-13 NOTE — Telephone Encounter (Signed)

## 2019-05-14 ENCOUNTER — Other Ambulatory Visit: Payer: Self-pay

## 2019-05-14 ENCOUNTER — Ambulatory Visit (INDEPENDENT_AMBULATORY_CARE_PROVIDER_SITE_OTHER): Payer: BLUE CROSS/BLUE SHIELD | Admitting: *Deleted

## 2019-05-14 DIAGNOSIS — Z951 Presence of aortocoronary bypass graft: Secondary | ICD-10-CM

## 2019-05-14 DIAGNOSIS — Z954 Presence of other heart-valve replacement: Secondary | ICD-10-CM

## 2019-05-14 DIAGNOSIS — Z5181 Encounter for therapeutic drug level monitoring: Secondary | ICD-10-CM | POA: Diagnosis not present

## 2019-05-14 DIAGNOSIS — Z952 Presence of prosthetic heart valve: Secondary | ICD-10-CM | POA: Diagnosis not present

## 2019-05-14 LAB — POCT INR: INR: 2.5 (ref 2.0–3.0)

## 2019-05-29 ENCOUNTER — Telehealth: Payer: Self-pay

## 2019-05-29 NOTE — Telephone Encounter (Signed)

## 2019-06-07 ENCOUNTER — Ambulatory Visit (INDEPENDENT_AMBULATORY_CARE_PROVIDER_SITE_OTHER): Payer: BC Managed Care – PPO | Admitting: Pharmacist

## 2019-06-07 ENCOUNTER — Other Ambulatory Visit: Payer: Self-pay

## 2019-06-07 DIAGNOSIS — Z951 Presence of aortocoronary bypass graft: Secondary | ICD-10-CM | POA: Diagnosis not present

## 2019-06-07 DIAGNOSIS — Z954 Presence of other heart-valve replacement: Secondary | ICD-10-CM

## 2019-06-07 DIAGNOSIS — Z952 Presence of prosthetic heart valve: Secondary | ICD-10-CM

## 2019-06-07 DIAGNOSIS — Z5181 Encounter for therapeutic drug level monitoring: Secondary | ICD-10-CM

## 2019-06-07 LAB — POCT INR: INR: 2.5 (ref 2.0–3.0)

## 2019-06-07 NOTE — Patient Instructions (Signed)
Description   Continue 1/2 tablet daily except 1 tablet on Tuesdays, Thursdays and Sundays.  Recheck INR in 6 weeks. [facility]madin Clinic [phone].

## 2019-07-15 ENCOUNTER — Other Ambulatory Visit: Payer: Self-pay | Admitting: Cardiovascular Disease

## 2019-07-17 ENCOUNTER — Telehealth: Payer: Self-pay

## 2019-07-17 NOTE — Telephone Encounter (Signed)

## 2019-07-19 ENCOUNTER — Other Ambulatory Visit: Payer: Self-pay

## 2019-07-19 ENCOUNTER — Ambulatory Visit (INDEPENDENT_AMBULATORY_CARE_PROVIDER_SITE_OTHER): Payer: BC Managed Care – PPO | Admitting: *Deleted

## 2019-07-19 DIAGNOSIS — Z952 Presence of prosthetic heart valve: Secondary | ICD-10-CM

## 2019-07-19 DIAGNOSIS — Z951 Presence of aortocoronary bypass graft: Secondary | ICD-10-CM | POA: Diagnosis not present

## 2019-07-19 DIAGNOSIS — Z954 Presence of other heart-valve replacement: Secondary | ICD-10-CM

## 2019-07-19 DIAGNOSIS — Z5181 Encounter for therapeutic drug level monitoring: Secondary | ICD-10-CM | POA: Diagnosis not present

## 2019-07-19 LAB — POCT INR: INR: 2.3 (ref 2.0–3.0)

## 2019-07-19 NOTE — Patient Instructions (Signed)
Description   Continue taking 1/2 tablet daily except 1 tablet on Tuesdays, Thursdays and Sundays.  Recheck INR in 8 weeks. [facility] [phone].

## 2019-08-05 ENCOUNTER — Other Ambulatory Visit: Payer: Self-pay | Admitting: Cardiovascular Disease

## 2019-08-13 NOTE — Progress Notes (Signed)
Virtual Visit via Video Note   This visit type was conducted due to national recommendations for restrictions regarding the COVID-19 Pandemic (e.g. social distancing) in an effort to limit this patient's exposure and mitigate transmission in our community.  Due to his co-morbid illnesses, this patient is at least at moderate risk for complications without adequate follow up.  This format is felt to be most appropriate for this patient at this time.  All issues noted in this document were discussed and addressed.  A limited physical exam was performed with this format.  Please refer to the patient's chart for his consent to telehealth for [facility].   Date:  08/14/2019   ID:  [name], DOB 09-May-1956, MRN [id number]  Patient Location: Home Provider Location: Office  PCP:  Aurea Graff.Marlou Sa, MD  Cardiologist:   Johnsie Cancel Electrophysiologist:  None   Evaluation Performed:  Follow-Up Visit  Chief Complaint:  AVR  History of Present Illness:    [name] is a 63 y.o. . male who presents for f/u of redo AVR   Initial surgery bioprosthetic tissue valve at patient request 2010 for bicuspid AV. Had severe calcific restenosis and had redo surgery using 21 mm Sorin [employer] Top Hat bileaflet mechanical valve with Dr Roxy Manns 02/21/17.  Had global ischemia necessitating emergent add on CABG Unfortunately post op EF 30% Post op gradients 04/25/18 high mean 30 peak 56 mmHg with DVI 0.18.  Patient declined fluoroscopy or TEE. August 2019 had febrile illness for a few weeks empiracally Rx with cipro. Negative blood cultures Again deferred TEE.    TTE done 07/13/18 reviewed EF 30-35% RWMAls anterior MI AVR well seated trivial AR mean gradient 28 mmHg high peak 50 mmHg but stable DVI abnormal .25  No evidence of SBE  TTE done 08/14/19 similar EF 30-35% anterior MI mean gradient up 36 mmHg peak 61 mmHg trivial AR DVI 0.25   He will look into entresto cost Still exercising and biking 10 miles/day Has  lost about 10 lbs   The patient  does not have symptoms concerning for COVID-19 infection (fever, chills, cough, or new shortness of breath).    Past Medical History:  Diagnosis Date  . Aortic insufficiency   . Aortic valve disease    a. Biscuspid AVR with heavy annular calcification s/p tissue AVR (pt preferred). b. Mod-severe perivalvular regurg by cath 08/2013, mod by TEE 08/2013, gradients lower by TEE than by echo.   . Complication of anesthesia    when intubated  damage to vocal after surgery  2010  . Depression   . Frequent urination   . Hearing loss    bilateral, pt wears hearing aids   . Nonischemic cardiomyopathy ([place])    a. EF normal immediately after AVR, then decreased to 40-45% by echo & 34% by MRI 2011. b. Most recent EF 40-45% by echo/TEE 08/2013.  . Paravalvular leak of prosthetic heart valve   . Prosthetic valve dysfunction   . Reflux   . Rosacea   . S/P aortic valve replacement with bioprosthetic valve 12/08/2009   15mm [facility] Ease bovine pericardial tissue valve placed via right mini thoracotomy approach  . 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 Shortness of breath    with exertion  .  Thrombocytopenia (HCC)    Past Surgical History:  Procedure Laterality Date  . AORTIC VALVE REPLACEMENT  12/08/2009   Procedure: Right miniature anterior thoracotomy for aortic valve replacement (32[facility]mm [facility] Ease pericardial tissue valve) Surgeon: Salvatore Decentlarence H. Cornelius Moraswen, MD assistant: Kerin PernaPeter Van Trigt, MD  . AORTIC VALVE REPLACEMENT N/A 02/21/2017   Procedure: REDO AORTIC VALVE REPLACEMENT (AVR) implanted with Carbomedics Supra- Annular (Top Hat) size 21;  Surgeon: Purcell Nailslarence H Owen, MD;  Location: [facility] OR;  Service: Open Heart Surgery;  Laterality: N/A;  . CARDIAC CATHETERIZATION N/A 01/04/2017    Procedure: Right/Left Heart Cath and Coronary Angiography;  Surgeon: Lyn RecordsHenry W Smith, MD;  [facility];  Service: Cardiovascular;  Laterality: N/A;  . CORONARY ARTERY BYPASS GRAFT N/A 02/21/2017   Procedure: CORONARY ARTERY BYPASS GRAFTING (CABG) x3 with left internal mammary artery and left greater saphenous vein harvested endoscopically;  Surgeon: Purcell Nailslarence H Owen, MD;  Location: [facility] OR;  Service: Open Heart Surgery;  Laterality: N/A;  . INGUINAL HERNIA REPAIR     Left  . INGUINAL HERNIA REPAIR  1960   right side  . LEFT AND RIGHT HEART CATHETERIZATION WITH CORONARY ANGIOGRAM N/A 09/19/2013   Procedure: LEFT AND RIGHT HEART CATHETERIZATION WITH CORONARY ANGIOGRAM;  Surgeon: Wendall StadePeter C Devony Mcgrady, MD;  [facility];  Service: Cardiovascular;  Laterality: N/A;  . TEE WITHOUT CARDIOVERSION N/A 09/19/2013   Procedure: TRANSESOPHAGEAL ECHOCARDIOGRAM (TEE);  Surgeon: Wendall StadePeter C Marks Scalera, MD;  [facility];  Service: Cardiovascular;  Laterality: N/A;  . TEE WITHOUT CARDIOVERSION N/A 02/21/2017   Procedure: TRANSESOPHAGEAL ECHOCARDIOGRAM (TEE);  Surgeon: Purcell Nailslarence H Owen, MD;  [facility];  Service: Open Heart Surgery;  Laterality: N/A;  . TOOTH EXTRACTION Left August 2014   left bottom molar     Current Meds  Medication Sig  . amoxicillin (AMOXIL) 500 MG tablet Take 2,000 mg by mouth once as needed (takes prior to dental surgeries).   Marland Kitchen. aspirin EC 81 MG tablet Take 1 tablet (81 mg total) by mouth daily.  . carvedilol (COREG) 3.125 MG tablet Take 1 tablet (3.125 mg total) by mouth 2 (two) times daily with a meal. Please keep upcoming appt in August with Dr. Eden EmmsNishan for future refills. Thank you  . ciprofloxacin (CIPRO) 500 MG tablet Take 500 mg by mouth 2 (two) times daily.  Marland Kitchen. losartan (COZAAR) 25 MG tablet Take 1 tablet (25 mg total) by mouth daily. Please keep upcoming appt for further refills. Thank you  . Multiple Vitamin (MULTIVITAMIN) tablet Take 1 tablet by mouth daily.  Marland Kitchen.  warfarin (COUMADIN) 7.5 MG tablet TAKE AS DIRECTED BY COUMADIN CLINIC     Allergies:   No known allergies   Social History   Tobacco Use  . Smoking status: Never Smoker  . Smokeless tobacco: Never Used  Substance Use Topics  . Alcohol use: Yes    Alcohol/week: 1.0 standard drinks    Types: 1 Cans of beer per week    Comment: occasional  . Drug use: No     Family Hx: The patient's family history includes Hypertension in his father and mother.  ROS:   Please see the history of present illness.     All other systems reviewed and are negative.   Prior CV studies:   The following studies were reviewed today:  Echo 07/13/18  Labs/Other Tests and Data Reviewed:    EKG:  SR LVH lateral T wave changes 04/30/18  Recent Labs: No results found for requested labs  within last 8760 hours.   Recent Lipid Panel No results found for: CHOL, TRIG, HDL, CHOLHDL, LDLCALC, LDLDIRECT  Wt Readings from Last 3 Encounters:  08/14/19 179 lb (81.2 kg)  08/07/18 180 lb 6.4 oz (81.8 kg)  07/20/18 180 lb 12 oz (82 kg)     Objective:    Vital Signs:  BP 113/71   Pulse 60   Ht 6' (1.829 m)   Wt 179 lb (81.2 kg)   BMI 24.28 kg/m    Skin warm and dry No distress No tachypnea No JVP elevation  Neuro appears non focal No edema  Telephone visit no exam   ASSESSMENT & PLAN:    1. AVR:  Redo 2018 now mechanical bi leaflet TopHat. INR;s Rx with no bleeding issues Post op gradients high. He is active without symptoms and has declined TEE in past  2. CAD:  Intra operative complication with global ischemia and need for emergent add on CABG during redo AVR 2018.  No chest pain continue ASA And beta blocker 3. Low EF :  30-35% continue ARB and coreg functional class one he will let us know if he can afford entresto   COVID-19 Education: The signs and symptoms of COVID-19 were discussed with the patient and how to seek care for testing (follow up with PCP or arrange E-visit).  The importance of  social distancing was discussed today.  Time:   Today, I have spent 30 minutes with the patient with telehealth technology discussing the above problems.     Medication Adjustments/Labs and Tests Ordered: Current medicines are reviewed at length with the patient today.  Concerns regarding medicines are outlined above.   Tests Ordered:  None   Medication Changes:  None   Disposition:  Follow up in a year  Signed, [name], MD  08/14/2019 10:22 AM    [facility]

## 2019-08-14 ENCOUNTER — Telehealth (INDEPENDENT_AMBULATORY_CARE_PROVIDER_SITE_OTHER): Payer: BC Managed Care – PPO | Admitting: Cardiovascular Disease

## 2019-08-14 ENCOUNTER — Other Ambulatory Visit: Payer: Self-pay

## 2019-08-14 ENCOUNTER — Ambulatory Visit (HOSPITAL_COMMUNITY): Payer: BC Managed Care – PPO | Attending: Cardiovascular Disease

## 2019-08-14 ENCOUNTER — Ambulatory Visit: Payer: BLUE CROSS/BLUE SHIELD | Admitting: Cardiovascular Disease

## 2019-08-14 ENCOUNTER — Encounter: Payer: Self-pay | Admitting: Cardiovascular Disease

## 2019-08-14 VITALS — BP 113/71 | HR 60 | Ht 72.0 in | Wt 179.0 lb

## 2019-08-14 DIAGNOSIS — Z952 Presence of prosthetic heart valve: Secondary | ICD-10-CM

## 2019-08-14 DIAGNOSIS — I5043 Acute on chronic combined systolic (congestive) and diastolic (congestive) heart failure: Secondary | ICD-10-CM | POA: Diagnosis not present

## 2019-08-14 MED ORDER — PERFLUTREN LIPID MICROSPHERE
1.0000 mL | INTRAVENOUS | Status: AC | PRN
  Administered 2019-08-14: 1 mL via INTRAVENOUS

## 2019-08-14 NOTE — Patient Instructions (Addendum)
Your physician has recommended you make the following change in your medication: PT TO CHECK AND SEE IF CAN AFFORD ENTRESTO  WILL START LOWEST DOSE IF CAN NEEDS TO STOP COZAAR  Your physician recommends that you schedule a follow-up appointment in:  [place] TO TITRATE ENTRESTO   Your physician wants you to follow-up in: [place] will receive a reminder letter in the mail two months in advance. If you don't receive a letter, please call our office to schedule the follow-up appointment.

## 2019-08-22 ENCOUNTER — Other Ambulatory Visit: Payer: Self-pay | Admitting: Cardiovascular Disease

## 2019-08-22 MED ORDER — LOSARTAN POTASSIUM 25 MG PO TABS: 25.0000 mg | ORAL_TABLET | Freq: Every day | ORAL | 3 refills | Status: DC

## 2019-09-13 ENCOUNTER — Other Ambulatory Visit: Payer: Self-pay

## 2019-09-13 ENCOUNTER — Ambulatory Visit (INDEPENDENT_AMBULATORY_CARE_PROVIDER_SITE_OTHER): Payer: BC Managed Care – PPO | Admitting: *Deleted

## 2019-09-13 DIAGNOSIS — Z954 Presence of other heart-valve replacement: Secondary | ICD-10-CM | POA: Diagnosis not present

## 2019-09-13 DIAGNOSIS — Z952 Presence of prosthetic heart valve: Secondary | ICD-10-CM | POA: Diagnosis not present

## 2019-09-13 DIAGNOSIS — Z5181 Encounter for therapeutic drug level monitoring: Secondary | ICD-10-CM | POA: Diagnosis not present

## 2019-09-13 DIAGNOSIS — Z951 Presence of aortocoronary bypass graft: Secondary | ICD-10-CM | POA: Diagnosis not present

## 2019-09-13 LAB — POCT INR: INR: 2.3 (ref 2.0–3.0)

## 2019-09-13 NOTE — Patient Instructions (Signed)
Description   Continue taking 1/2 tablet daily except 1 tablet on Sundays,  Tuesdays, and Thursdays.  Recheck INR in 8 weeks. Coumadin Clinic [phone].      

## 2019-09-16 ENCOUNTER — Other Ambulatory Visit: Payer: Self-pay | Admitting: Cardiovascular Disease

## 2019-09-24 DIAGNOSIS — L570 Actinic keratosis: Secondary | ICD-10-CM | POA: Diagnosis not present

## 2019-09-24 DIAGNOSIS — L720 Epidermal cyst: Secondary | ICD-10-CM | POA: Diagnosis not present

## 2019-09-24 DIAGNOSIS — D229 Melanocytic nevi, unspecified: Secondary | ICD-10-CM | POA: Diagnosis not present

## 2019-09-24 DIAGNOSIS — L821 Other seborrheic keratosis: Secondary | ICD-10-CM | POA: Diagnosis not present

## 2019-10-14 ENCOUNTER — Other Ambulatory Visit: Payer: Self-pay | Admitting: Cardiovascular Disease

## 2019-10-16 DIAGNOSIS — M25511 Pain in right shoulder: Secondary | ICD-10-CM | POA: Diagnosis not present

## 2019-10-16 DIAGNOSIS — Z125 Encounter for screening for malignant neoplasm of prostate: Secondary | ICD-10-CM | POA: Diagnosis not present

## 2019-10-16 DIAGNOSIS — Z23 Encounter for immunization: Secondary | ICD-10-CM | POA: Diagnosis not present

## 2019-10-16 DIAGNOSIS — Z0000 Encounter for general adult medical examination without abnormal findings: Secondary | ICD-10-CM | POA: Diagnosis not present

## 2019-10-16 DIAGNOSIS — I2510 Atherosclerotic heart disease of native coronary artery without angina pectoris: Secondary | ICD-10-CM | POA: Diagnosis not present

## 2019-10-18 DIAGNOSIS — Z1211 Encounter for screening for malignant neoplasm of colon: Secondary | ICD-10-CM | POA: Diagnosis not present

## 2019-11-08 ENCOUNTER — Other Ambulatory Visit: Payer: Self-pay

## 2019-11-08 ENCOUNTER — Ambulatory Visit (INDEPENDENT_AMBULATORY_CARE_PROVIDER_SITE_OTHER): Payer: BC Managed Care – PPO | Admitting: *Deleted

## 2019-11-08 DIAGNOSIS — Z952 Presence of prosthetic heart valve: Secondary | ICD-10-CM

## 2019-11-08 DIAGNOSIS — Z951 Presence of aortocoronary bypass graft: Secondary | ICD-10-CM

## 2019-11-08 DIAGNOSIS — Z954 Presence of other heart-valve replacement: Secondary | ICD-10-CM

## 2019-11-08 DIAGNOSIS — Z5181 Encounter for therapeutic drug level monitoring: Secondary | ICD-10-CM

## 2019-11-08 LAB — POCT INR: INR: 2.3 (ref 2.0–3.0)

## 2019-11-08 NOTE — Patient Instructions (Addendum)
Description   Continue taking 1/2 tablet daily except 1 tablet on Sundays,  Tuesdays, and Thursdays.  Recheck INR in 7 weeks. [facility] [phone].

## 2019-11-19 NOTE — Progress Notes (Signed)
INR 2.3

## 2019-12-10 ENCOUNTER — Other Ambulatory Visit: Payer: Self-pay | Admitting: Cardiovascular Disease

## 2019-12-26 ENCOUNTER — Other Ambulatory Visit: Payer: Self-pay

## 2019-12-26 ENCOUNTER — Ambulatory Visit (INDEPENDENT_AMBULATORY_CARE_PROVIDER_SITE_OTHER): Payer: BC Managed Care – PPO | Admitting: *Deleted

## 2019-12-26 DIAGNOSIS — Z951 Presence of aortocoronary bypass graft: Secondary | ICD-10-CM | POA: Diagnosis not present

## 2019-12-26 DIAGNOSIS — Z952 Presence of prosthetic heart valve: Secondary | ICD-10-CM

## 2019-12-26 DIAGNOSIS — Z954 Presence of other heart-valve replacement: Secondary | ICD-10-CM

## 2019-12-26 DIAGNOSIS — Z5181 Encounter for therapeutic drug level monitoring: Secondary | ICD-10-CM

## 2019-12-26 LAB — POCT INR: INR: 2.3 (ref 2.0–3.0)

## 2019-12-26 NOTE — Patient Instructions (Signed)
Description   Continue taking 1/2 tablet daily except 1 tablet on Sundays,  Tuesdays, and Thursdays.  Recheck INR in 8 weeks. [facility] [phone].

## 2019-12-26 NOTE — Progress Notes (Signed)
Pt stated that his insurance is changing and he will no longer be coming here and he is going to find new doctors and new place to get his INR checked through [facility].

## 2020-03-06 ENCOUNTER — Other Ambulatory Visit: Payer: Self-pay | Admitting: Cardiovascular Disease

## 2020-03-06 NOTE — Telephone Encounter (Signed)
Pt is now managed and monitored by Amedeo Plenty, FNP with [facility].  Will refuse rx with message to forward rx refill to new MD.  Pt switched due to insurance changes.

## 2020-10-11 ENCOUNTER — Other Ambulatory Visit: Payer: Self-pay | Admitting: Cardiovascular Disease

## 2020-10-27 ENCOUNTER — Other Ambulatory Visit: Payer: Self-pay | Admitting: Cardiovascular Disease

## 2021-11-02 LAB — EXTERNAL GENERIC LAB PROCEDURE: COLOGUARD: NEGATIVE
